# Patient Record
Sex: Male | Born: 1995 | Race: Black or African American | Hispanic: No | Marital: Single | Smoking: Former smoker
Health system: Southern US, Community
[De-identification: ages and names within clinical notes are randomized; demographics above are authoritative.]

## PROBLEM LIST (undated history)

## (undated) DIAGNOSIS — K219 Gastro-esophageal reflux disease without esophagitis: Secondary | ICD-10-CM

## (undated) DIAGNOSIS — Z008 Encounter for other general examination: Secondary | ICD-10-CM

## (undated) DIAGNOSIS — G8250 Quadriplegia, unspecified: Secondary | ICD-10-CM

## (undated) DIAGNOSIS — K592 Neurogenic bowel, not elsewhere classified: Secondary | ICD-10-CM

## (undated) DIAGNOSIS — N390 Urinary tract infection, site not specified: Secondary | ICD-10-CM

## (undated) DIAGNOSIS — W3400XA Accidental discharge from unspecified firearms or gun, initial encounter: Secondary | ICD-10-CM

## (undated) DIAGNOSIS — B9620 Unspecified Escherichia coli [E. coli] as the cause of diseases classified elsewhere: Secondary | ICD-10-CM

## (undated) DIAGNOSIS — L739 Follicular disorder, unspecified: Secondary | ICD-10-CM

## (undated) DIAGNOSIS — E669 Obesity, unspecified: Secondary | ICD-10-CM

## (undated) DIAGNOSIS — G43909 Migraine, unspecified, not intractable, without status migrainosus: Secondary | ICD-10-CM

## (undated) DIAGNOSIS — F4310 Post-traumatic stress disorder, unspecified: Secondary | ICD-10-CM

## (undated) DIAGNOSIS — N319 Neuromuscular dysfunction of bladder, unspecified: Secondary | ICD-10-CM

## (undated) DIAGNOSIS — S14109A Unspecified injury at unspecified level of cervical spinal cord, initial encounter: Secondary | ICD-10-CM

## (undated) HISTORY — DX: Urinary tract infection, site not specified: B96.20

## (undated) HISTORY — DX: Unspecified Escherichia coli (E. coli) as the cause of diseases classified elsewhere: N39.0

## (undated) HISTORY — DX: Gastro-esophageal reflux disease without esophagitis: K21.9

## (undated) HISTORY — DX: Follicular disorder, unspecified: L73.9

## (undated) HISTORY — DX: Neuromuscular dysfunction of bladder, unspecified: N31.9

## (undated) HISTORY — DX: Encounter for other general examination: Z00.8

## (undated) HISTORY — DX: Neurogenic bowel, not elsewhere classified: K59.2

## (undated) HISTORY — DX: Unspecified injury at unspecified level of cervical spinal cord, initial encounter: S14.109A

## (undated) HISTORY — DX: Post-traumatic stress disorder, unspecified: F43.10

## (undated) HISTORY — DX: Accidental discharge from unspecified firearms or gun, initial encounter: W34.00XA

## (undated) HISTORY — PX: OTHER SURGICAL HISTORY: SHX169

## (undated) HISTORY — DX: Quadriplegia, unspecified: G82.50
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
## 2016-12-09 ENCOUNTER — Emergency Department (HOSPITAL_COMMUNITY): Payer: BLUE CROSS/BLUE SHIELD

## 2016-12-09 ENCOUNTER — Emergency Department (HOSPITAL_COMMUNITY)
Admission: EM | Admit: 2016-12-09 | Discharge: 2016-12-09 | Disposition: A | Discharge status: Home / Self Care | Payer: BLUE CROSS/BLUE SHIELD | Attending: Emergency Medicine | Admitting: Emergency Medicine

## 2016-12-09 ENCOUNTER — Encounter (HOSPITAL_COMMUNITY): Payer: Self-pay | Admitting: *Deleted

## 2016-12-09 DIAGNOSIS — Y929 Unspecified place or not applicable: Secondary | ICD-10-CM | POA: Insufficient documentation

## 2016-12-09 DIAGNOSIS — X500XXA Overexertion from strenuous movement or load, initial encounter: Secondary | ICD-10-CM | POA: Diagnosis not present

## 2016-12-09 DIAGNOSIS — M5431 Sciatica, right side: Secondary | ICD-10-CM | POA: Diagnosis not present

## 2016-12-09 DIAGNOSIS — Y939 Activity, unspecified: Secondary | ICD-10-CM | POA: Diagnosis not present

## 2016-12-09 DIAGNOSIS — Z87891 Personal history of nicotine dependence: Secondary | ICD-10-CM | POA: Diagnosis not present

## 2016-12-09 DIAGNOSIS — M5441 Lumbago with sciatica, right side: Secondary | ICD-10-CM

## 2016-12-09 DIAGNOSIS — M549 Dorsalgia, unspecified: Secondary | ICD-10-CM | POA: Diagnosis present

## 2016-12-09 DIAGNOSIS — Y990 Civilian activity done for income or pay: Secondary | ICD-10-CM | POA: Insufficient documentation

## 2016-12-09 MED ORDER — METHOCARBAMOL 500 MG PO TABS: 500.0000 mg | ORAL_TABLET | Freq: Four times a day (QID) | ORAL | 0 refills | Status: DC

## 2016-12-09 NOTE — Discharge Instructions (Signed)
Please read attached information. If you experience any new or worsening signs or symptoms please return to the emergency room for evaluation. Please follow-up with your primary care provider or specialist as discussed. Please use medication prescribed only as directed and discontinue taking if you have any concerning signs or symptoms.   °

## 2016-12-09 NOTE — ED Triage Notes (Signed)
Pt reports back pain for about 2-3 weeks.  Pt reports that he works for [employer] and lifts heavy boxes and thinks that may be the cause of pain.  Pt reports his right legs feels a little weaker and at times he will feel a slight tingling sensation.  Pt a/o x 4 and ambulatory.

## 2016-12-09 NOTE — ED Provider Notes (Signed)
[facility]-EMERGENCY DEPT Provider Note   CSN: [id number] Arrival date & time: 12/09/16  1339   By signing my name below, I, [name], attest that this documentation has been prepared under the direction and in the presence of Keith FortsJeff Wyndell Cardiff, PA-C Electronically Signed: [name], ED Scribe. 12/09/16. 4:01 PM.  History   Chief Complaint Chief Complaint  Patient presents with  . Back Pain    HPI Keith [name] is a 21 y.o. male who presents to the Emergency Department complaining of lower back pain onset 3 weeks ago after lifting a heavy box. Pt reports associated tingling sensation to right posterior leg upon standing. Pt has tried advil with no relief of his symptoms. He notes that he works for [employer] and lifts heavy boxes and contributes his lower back pain to this. He notes that he has continued working since the initial incident. Pt denies numbness, weakness, color change, wound, cauda equina syndrome, fever, constipation, difficulty urinating, and any other symptoms.     The history is provided by the patient. No language interpreter was used.    Past Medical History:  Diagnosis Date  . Psychological assessment 2006   school assessment for ADHD behaviors (second grade)    Patient Active Problem List   Diagnosis Date Noted  . Closed fracture of upper end of tibia 07/28/2013  . Closed fracture of tibial tubercle 07/28/2013  . OSA (obstructive sleep apnea) 09/18/2012  . Headache(784.0) 08/25/2012  . 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 Well adolescent visit 01/31/2012  . Migraine 01/31/2012  . Rhinitis 01/31/2012  . Toenail deformity 01/31/2012  . Tendinitis of right knee 01/31/2012  . 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 ELEVATED BP READING WITHOUT DX HYPERTENSION 01/02/2009  . OBESITY 10/20/2007  . HEEL PAIN, BILATERAL 10/20/2007    Past Surgical History:  Procedure Laterality Date  . ORIF TIBIA FRACTURE Left  07/28/2013   Procedure: OPEN REDUCTION INTERNAL FIXATION (ORIF) TIBIA FRACTURE;  Surgeon: Loanne DrillingFrank V Aluisio, MD;  Location: [facility];  Service: Orthopedics;  Laterality: Left;       Home Medications    Prior to Admission medications   Medication Sig Start Date End Date Taking? Authorizing Provider  ALPRAZolam (XANAX) 0.25 MG tablet Take 1 tablet (0.25 mg total) by mouth 3 (three) times daily as needed for anxiety. 11/11/15   Loren RacerYelverton, David, MD  ibuprofen (ADVIL,MOTRIN) 800 MG tablet Take 1 tablet (800 mg total) by mouth every 8 (eight) hours as needed for mild pain or moderate pain. Patient not taking: Reported on 11/10/2015 09/18/15   Fayrene Helperran, Bowie, PA-C  methocarbamol (ROBAXIN) 500 MG tablet Take 1 tablet (500 mg total) by mouth 4 (four) times daily. 12/09/16   Annalaya Wile, Tinnie GensJeffrey, PA-C  oxyCODONE (OXY IR/ROXICODONE) 5 MG immediate release tablet Take 1-2 tablets (5-10 mg total) by mouth every 3 (three) hours as needed for moderate pain, severe pain or breakthrough pain. Patient not taking: Reported on 11/10/2015 07/29/13   Lauraine RinnePerkins, Alexzandrew L, PA-C    Family History Family History  Problem Relation Age of Onset  . ADD / ADHD Other        siblings  . GER disease Mother   . Obesity Other        Obesity runs in the family    Social History Social History  Substance Use Topics  . Smoking status: Never Smoker  . Smokeless tobacco: Not on file  . Alcohol use No  Allergies   Diflucan [fluconazole] and Saline   Review of Systems Review of Systems  Constitutional: Negative for fever.  Gastrointestinal: Negative for constipation.  Genitourinary: Negative for difficulty urinating.  Musculoskeletal: Positive for back pain (lower).  Skin: Negative for color change and wound.  Neurological: Negative for weakness and numbness.       +tingling sensation to right posterior leg  All other systems reviewed and are negative.    Physical Exam Updated Vital Signs BP 135/70 (BP Location:  Left Arm)   Pulse 69   Temp 98 F (36.7 C) (Oral)   Resp 16   SpO2 98%   Physical Exam  Constitutional: He is oriented to person, place, and time. He appears well-developed and well-nourished. No distress.  HENT:  Head: Normocephalic.  Neck: Normal range of motion. Neck supple.  Pulmonary/Chest: Effort normal.  Musculoskeletal: Normal range of motion. He exhibits tenderness. He exhibits no edema.       Cervical back: Normal.       Thoracic back: Normal.       Lumbar back: He exhibits tenderness. He exhibits no bony tenderness.  No C, T, or L spine tenderness to palpation. Very minimal tenderness to the right lateral lumbar soft tissue. No redness or swelling. SLR positive. No obvious signs of trauma, deformity, infection, step-offs. Lung expansion normal. No scoliosis or kyphosis.  Neurological: He is alert and oriented to person, place, and time.  Skin: Skin is warm and dry. He is not diaphoretic.  Psychiatric: He has a normal mood and affect. His behavior is normal. Judgment and thought content normal.  Nursing note and vitals reviewed.    ED Treatments / Results  DIAGNOSTIC STUDIES: Oxygen Saturation is 98% on RA, nl by my interpretation.    COORDINATION OF CARE: 3:56 PM Discussed treatment plan with pt at bedside and pt agreed to plan.   Labs (all labs ordered are listed, but only abnormal results are displayed) Labs Reviewed - No data to display  EKG  EKG Interpretation None       Radiology Dg Lumbar Spine Complete  Result Date: 12/09/2016 CLINICAL DATA:  Low back pain. EXAM: LUMBAR SPINE - COMPLETE 4+ VIEW COMPARISON:  CT scan abdomen dated 04/03/2007 FINDINGS: There is no evidence of lumbar spine fracture. Alignment is normal. Intervertebral disc spaces are maintained. Facet joints are normal. IMPRESSION: Negative. Electronically Signed   By: Francene Boyers M.D.   On: 12/09/2016 16:53    Procedures Procedures (including critical care time)  Medications  Ordered in ED Medications - No data to display   Initial Impression / Assessment and Plan / ED Course  I have reviewed the triage vital signs and the nursing notes.  Pertinent labs & imaging results that were available during my care of the patient were reviewed by me and considered in my medical decision making (see chart for details).     Assessment/Plan: Patient presents with sciatic back problems with No sign of neuro impingement, no red flags. Pt ambulates with minimal difficulty. Plain films show no acute fracture. Pt advised to use symptomatic therapy. Pt advised to follow up with neurosurgery. Given strict return precautions in the event new or worsening symptoms present. Pt verbalized understanding and agreement to today's plan and had no further questions or concerns at this time.   Final Clinical Impressions(s) / ED Diagnoses   Final diagnoses:  Acute right-sided low back pain with right-sided sciatica    New Prescriptions New Prescriptions   METHOCARBAMOL (ROBAXIN)  500 MG TABLET    Take 1 tablet (500 mg total) by mouth 4 (four) times daily.   I personally performed the services described in this documentation, which was scribed in my presence. The recorded information has been reviewed and is accurate.   Eyvonne MechanicHedges, Jennfier Abdulla, PA-C 12/09/16 1730    Alvira MondaySchlossman, Erin, MD 12/10/16 [phone]

## 2017-05-13 DIAGNOSIS — W3400XA Accidental discharge from unspecified firearms or gun, initial encounter: Secondary | ICD-10-CM

## 2017-05-13 DIAGNOSIS — S14109A Unspecified injury at unspecified level of cervical spinal cord, initial encounter: Secondary | ICD-10-CM

## 2017-05-13 HISTORY — DX: Unspecified injury at unspecified level of cervical spinal cord, initial encounter: S14.109A

## 2017-05-13 HISTORY — DX: Accidental discharge from unspecified firearms or gun, initial encounter: W34.00XA

## 2017-05-19 ENCOUNTER — Emergency Department (HOSPITAL_COMMUNITY)
Admission: EM | Admit: 2017-05-19 | Discharge: 2017-05-19 | Disposition: A | Discharge status: Home / Self Care | Payer: BLUE CROSS/BLUE SHIELD | Attending: Emergency Medicine | Admitting: Emergency Medicine

## 2017-05-19 ENCOUNTER — Emergency Department (HOSPITAL_COMMUNITY): Payer: BLUE CROSS/BLUE SHIELD

## 2017-05-19 ENCOUNTER — Encounter (HOSPITAL_COMMUNITY): Payer: Self-pay | Admitting: Emergency Medicine

## 2017-05-19 ENCOUNTER — Other Ambulatory Visit: Payer: Self-pay

## 2017-05-19 DIAGNOSIS — S63218A Subluxation of metacarpophalangeal joint of other finger, initial encounter: Secondary | ICD-10-CM | POA: Insufficient documentation

## 2017-05-19 DIAGNOSIS — Y999 Unspecified external cause status: Secondary | ICD-10-CM | POA: Diagnosis not present

## 2017-05-19 DIAGNOSIS — X58XXXA Exposure to other specified factors, initial encounter: Secondary | ICD-10-CM | POA: Insufficient documentation

## 2017-05-19 DIAGNOSIS — Y929 Unspecified place or not applicable: Secondary | ICD-10-CM | POA: Insufficient documentation

## 2017-05-19 DIAGNOSIS — S63219A Subluxation of metacarpophalangeal joint of unspecified finger, initial encounter: Secondary | ICD-10-CM

## 2017-05-19 DIAGNOSIS — Y939 Activity, unspecified: Secondary | ICD-10-CM | POA: Insufficient documentation

## 2017-05-19 DIAGNOSIS — S6991XA Unspecified injury of right wrist, hand and finger(s), initial encounter: Secondary | ICD-10-CM | POA: Diagnosis present

## 2017-05-19 DIAGNOSIS — S63619A Unspecified sprain of unspecified finger, initial encounter: Secondary | ICD-10-CM

## 2017-05-19 NOTE — Discharge Instructions (Addendum)
Schedule to see the Orthopaedist for evaluation  

## 2017-05-19 NOTE — Progress Notes (Signed)
Orthopedic Tech Progress Note Patient Details:  Renaldo HarrisonCameron R Staples 12/08/1995 [id number]  Ortho Devices Type of Ortho Device: Short arm splint Ortho Device/Splint Interventions: Application   Post Interventions Patient Tolerated: Well Instructions Provided: Care of device   Saul FordyceJennifer C Chanell Nadeau 05/19/2017, 5:26 PM

## 2017-05-19 NOTE — ED Triage Notes (Signed)
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 Today pain continued 9/10 achy sharp, Radial pulse +2 full sensation.
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
## 2017-05-23 ENCOUNTER — Other Ambulatory Visit: Payer: Self-pay

## 2017-05-23 ENCOUNTER — Emergency Department (HOSPITAL_COMMUNITY): Payer: BLUE CROSS/BLUE SHIELD

## 2017-05-23 ENCOUNTER — Inpatient Hospital Stay (HOSPITAL_COMMUNITY)
Admission: EM | Admit: 2017-05-23 | Discharge: 2017-06-12 | DRG: 028 | Disposition: A | Discharge status: Rehabilitation Facility | Payer: BLUE CROSS/BLUE SHIELD | Attending: General Surgery | Admitting: General Surgery

## 2017-05-23 ENCOUNTER — Encounter (HOSPITAL_COMMUNITY): Payer: Self-pay | Admitting: Emergency Medicine

## 2017-05-23 DIAGNOSIS — S12590A Other displaced fracture of sixth cervical vertebra, initial encounter for closed fracture: Secondary | ICD-10-CM | POA: Diagnosis present

## 2017-05-23 DIAGNOSIS — R6510 Systemic inflammatory response syndrome (SIRS) of non-infectious origin without acute organ dysfunction: Secondary | ICD-10-CM | POA: Diagnosis not present

## 2017-05-23 DIAGNOSIS — D62 Acute posthemorrhagic anemia: Secondary | ICD-10-CM

## 2017-05-23 DIAGNOSIS — S1190XA Unspecified open wound of unspecified part of neck, initial encounter: Secondary | ICD-10-CM | POA: Diagnosis not present

## 2017-05-23 DIAGNOSIS — M792 Neuralgia and neuritis, unspecified: Secondary | ICD-10-CM | POA: Diagnosis not present

## 2017-05-23 DIAGNOSIS — S1190XD Unspecified open wound of unspecified part of neck, subsequent encounter: Secondary | ICD-10-CM | POA: Diagnosis not present

## 2017-05-23 DIAGNOSIS — S42102A Fracture of unspecified part of scapula, left shoulder, initial encounter for closed fracture: Secondary | ICD-10-CM | POA: Diagnosis present

## 2017-05-23 DIAGNOSIS — M4312 Spondylolisthesis, cervical region: Secondary | ICD-10-CM | POA: Diagnosis present

## 2017-05-23 DIAGNOSIS — T1490XA Injury, unspecified, initial encounter: Secondary | ICD-10-CM | POA: Diagnosis not present

## 2017-05-23 DIAGNOSIS — N319 Neuromuscular dysfunction of bladder, unspecified: Secondary | ICD-10-CM | POA: Diagnosis not present

## 2017-05-23 DIAGNOSIS — F1210 Cannabis abuse, uncomplicated: Secondary | ICD-10-CM | POA: Diagnosis present

## 2017-05-23 DIAGNOSIS — S12690A Other displaced fracture of seventh cervical vertebra, initial encounter for closed fracture: Secondary | ICD-10-CM | POA: Diagnosis present

## 2017-05-23 DIAGNOSIS — S63101A Unspecified subluxation of right thumb, initial encounter: Secondary | ICD-10-CM | POA: Diagnosis present

## 2017-05-23 DIAGNOSIS — E669 Obesity, unspecified: Secondary | ICD-10-CM | POA: Diagnosis present

## 2017-05-23 DIAGNOSIS — G8220 Paraplegia, unspecified: Secondary | ICD-10-CM | POA: Diagnosis not present

## 2017-05-23 DIAGNOSIS — E871 Hypo-osmolality and hyponatremia: Secondary | ICD-10-CM | POA: Diagnosis not present

## 2017-05-23 DIAGNOSIS — G8254 Quadriplegia, C5-C7 incomplete: Secondary | ICD-10-CM | POA: Diagnosis present

## 2017-05-23 DIAGNOSIS — Z6839 Body mass index (BMI) 39.0-39.9, adult: Secondary | ICD-10-CM

## 2017-05-23 DIAGNOSIS — R631 Polydipsia: Secondary | ICD-10-CM | POA: Diagnosis present

## 2017-05-23 DIAGNOSIS — R509 Fever, unspecified: Secondary | ICD-10-CM | POA: Diagnosis not present

## 2017-05-23 DIAGNOSIS — L739 Follicular disorder, unspecified: Secondary | ICD-10-CM | POA: Diagnosis not present

## 2017-05-23 DIAGNOSIS — N390 Urinary tract infection, site not specified: Secondary | ICD-10-CM | POA: Diagnosis not present

## 2017-05-23 DIAGNOSIS — F17290 Nicotine dependence, other tobacco product, uncomplicated: Secondary | ICD-10-CM | POA: Diagnosis present

## 2017-05-23 DIAGNOSIS — W3400XA Accidental discharge from unspecified firearms or gun, initial encounter: Secondary | ICD-10-CM | POA: Diagnosis not present

## 2017-05-23 DIAGNOSIS — S14106A Unspecified injury at C6 level of cervical spinal cord, initial encounter: Secondary | ICD-10-CM | POA: Diagnosis present

## 2017-05-23 DIAGNOSIS — S14109D Unspecified injury at unspecified level of cervical spinal cord, subsequent encounter: Secondary | ICD-10-CM | POA: Diagnosis not present

## 2017-05-23 DIAGNOSIS — L738 Other specified follicular disorders: Secondary | ICD-10-CM | POA: Diagnosis not present

## 2017-05-23 DIAGNOSIS — F17210 Nicotine dependence, cigarettes, uncomplicated: Secondary | ICD-10-CM | POA: Diagnosis present

## 2017-05-23 DIAGNOSIS — S14109A Unspecified injury at unspecified level of cervical spinal cord, initial encounter: Secondary | ICD-10-CM

## 2017-05-23 DIAGNOSIS — W3400XD Accidental discharge from unspecified firearms or gun, subsequent encounter: Secondary | ICD-10-CM | POA: Diagnosis not present

## 2017-05-23 DIAGNOSIS — I82612 Acute embolism and thrombosis of superficial veins of left upper extremity: Secondary | ICD-10-CM | POA: Diagnosis present

## 2017-05-23 DIAGNOSIS — G8918 Other acute postprocedural pain: Secondary | ICD-10-CM | POA: Diagnosis not present

## 2017-05-23 DIAGNOSIS — S14107A Unspecified injury at C7 level of cervical spinal cord, initial encounter: Principal | ICD-10-CM | POA: Diagnosis present

## 2017-05-23 DIAGNOSIS — S1193XA Puncture wound without foreign body of unspecified part of neck, initial encounter: Secondary | ICD-10-CM

## 2017-05-23 DIAGNOSIS — E875 Hyperkalemia: Secondary | ICD-10-CM

## 2017-05-23 DIAGNOSIS — F4310 Post-traumatic stress disorder, unspecified: Secondary | ICD-10-CM | POA: Diagnosis not present

## 2017-05-23 DIAGNOSIS — S14109S Unspecified injury at unspecified level of cervical spinal cord, sequela: Secondary | ICD-10-CM | POA: Diagnosis not present

## 2017-05-23 DIAGNOSIS — S14155D Other incomplete lesion at C5 level of cervical spinal cord, subsequent encounter: Secondary | ICD-10-CM | POA: Diagnosis not present

## 2017-05-23 DIAGNOSIS — M542 Cervicalgia: Secondary | ICD-10-CM

## 2017-05-23 DIAGNOSIS — R58 Hemorrhage, not elsewhere classified: Secondary | ICD-10-CM | POA: Diagnosis present

## 2017-05-23 DIAGNOSIS — G8250 Quadriplegia, unspecified: Secondary | ICD-10-CM | POA: Diagnosis not present

## 2017-05-23 DIAGNOSIS — Z720 Tobacco use: Secondary | ICD-10-CM | POA: Diagnosis not present

## 2017-05-23 DIAGNOSIS — K592 Neurogenic bowel, not elsewhere classified: Secondary | ICD-10-CM | POA: Diagnosis not present

## 2017-05-23 DIAGNOSIS — Z419 Encounter for procedure for purposes other than remedying health state, unspecified: Secondary | ICD-10-CM

## 2017-05-23 DIAGNOSIS — S14156D Other incomplete lesion at C6 level of cervical spinal cord, subsequent encounter: Secondary | ICD-10-CM | POA: Diagnosis not present

## 2017-05-23 DIAGNOSIS — A419 Sepsis, unspecified organism: Secondary | ICD-10-CM

## 2017-05-23 DIAGNOSIS — Z452 Encounter for adjustment and management of vascular access device: Secondary | ICD-10-CM

## 2017-05-23 HISTORY — DX: Migraine, unspecified, not intractable, without status migrainosus: G43.909

## 2017-05-23 HISTORY — DX: Obesity, unspecified: E66.9

## 2017-05-23 LAB — PREPARE FRESH FROZEN PLASMA
UNIT DIVISION: 0
Unit division: 0

## 2017-05-23 LAB — URINALYSIS, ROUTINE W REFLEX MICROSCOPIC
BILIRUBIN URINE: NEGATIVE
GLUCOSE, UA: NEGATIVE mg/dL
HGB URINE DIPSTICK: NEGATIVE
Ketones, ur: NEGATIVE mg/dL
Leukocytes, UA: NEGATIVE
Nitrite: NEGATIVE
PH: 6 (ref 5.0–8.0)
Protein, ur: NEGATIVE mg/dL

## 2017-05-23 LAB — TYPE AND SCREEN
ABO/RH(D): O POS
ANTIBODY SCREEN: NEGATIVE
UNIT DIVISION: 0
Unit division: 0

## 2017-05-23 LAB — BPAM FFP
BLOOD PRODUCT EXPIRATION DATE: 201901272359
Blood Product Expiration Date: 201901252359
ISSUE DATE / TIME: 201901112028
ISSUE DATE / TIME: 201901112028
UNIT TYPE AND RH: 6200
Unit Type and Rh: 6200

## 2017-05-23 LAB — BPAM RBC
BLOOD PRODUCT EXPIRATION DATE: 201902062359
Blood Product Expiration Date: 201902042359
ISSUE DATE / TIME: 201901112028
ISSUE DATE / TIME: 201901112028
UNIT TYPE AND RH: 9500
UNIT TYPE AND RH: 9500

## 2017-05-23 LAB — ABO/RH: ABO/RH(D): O POS

## 2017-05-23 MED ORDER — SODIUM CHLORIDE 0.9 % IV SOLN
INTRAVENOUS | Status: DC
  Administered 2017-05-24 – 2017-05-25 (×4): via INTRAVENOUS

## 2017-05-23 MED ORDER — ONDANSETRON 4 MG PO TBDP: 4.0000 mg | ORAL_TABLET | Freq: Four times a day (QID) | ORAL | Status: DC | PRN

## 2017-05-23 MED ORDER — PANTOPRAZOLE SODIUM 40 MG PO TBEC
40.0000 mg | DELAYED_RELEASE_TABLET | Freq: Every day | ORAL | Status: DC
  Administered 2017-05-26 – 2017-06-12 (×17): 40 mg via ORAL
  Filled 2017-05-23 (×17): qty 1

## 2017-05-23 MED ORDER — HYDROMORPHONE HCL 1 MG/ML IJ SOLN
1.0000 mg | INTRAMUSCULAR | Status: DC | PRN
  Administered 2017-05-23 – 2017-05-29 (×29): 1 mg via INTRAVENOUS
  Filled 2017-05-23 (×30): qty 1

## 2017-05-23 MED ORDER — SODIUM CHLORIDE 0.9 % IV SOLN
INTRAVENOUS | Status: AC | PRN
  Administered 2017-05-23: 125 mL/h via INTRAVENOUS

## 2017-05-23 MED ORDER — METHOCARBAMOL 1000 MG/10ML IJ SOLN
500.0000 mg | Freq: Four times a day (QID) | INTRAVENOUS | Status: DC | PRN
  Filled 2017-05-23 (×2): qty 5

## 2017-05-23 MED ORDER — GABAPENTIN 300 MG PO CAPS
300.0000 mg | ORAL_CAPSULE | Freq: Three times a day (TID) | ORAL | Status: DC
  Administered 2017-05-24 – 2017-05-28 (×15): 300 mg via ORAL
  Filled 2017-05-23 (×15): qty 1

## 2017-05-23 MED ORDER — ONDANSETRON HCL 4 MG/2ML IJ SOLN: 4.0000 mg | Freq: Four times a day (QID) | INTRAMUSCULAR | Status: DC | PRN

## 2017-05-23 MED ORDER — DOCUSATE SODIUM 100 MG PO CAPS
100.0000 mg | ORAL_CAPSULE | Freq: Two times a day (BID) | ORAL | Status: DC
  Administered 2017-05-24 – 2017-05-28 (×10): 100 mg via ORAL
  Filled 2017-05-23 (×9): qty 1

## 2017-05-23 MED ORDER — OXYCODONE HCL 5 MG PO TABS
5.0000 mg | ORAL_TABLET | ORAL | Status: DC | PRN
  Administered 2017-05-24 – 2017-06-02 (×14): 5 mg via ORAL
  Filled 2017-05-23 (×18): qty 1

## 2017-05-23 MED ORDER — PANTOPRAZOLE SODIUM 40 MG IV SOLR
40.0000 mg | Freq: Every day | INTRAVENOUS | Status: DC
  Administered 2017-05-24 – 2017-05-25 (×2): 40 mg via INTRAVENOUS
  Filled 2017-05-23 (×2): qty 40

## 2017-05-23 MED ORDER — ACETAMINOPHEN 325 MG PO TABS
650.0000 mg | ORAL_TABLET | Freq: Four times a day (QID) | ORAL | Status: DC
  Administered 2017-05-24 – 2017-06-12 (×66): 650 mg via ORAL
  Filled 2017-05-23 (×68): qty 2

## 2017-05-23 MED ORDER — BISACODYL 10 MG RE SUPP
10.0000 mg | Freq: Every day | RECTAL | Status: DC | PRN
  Administered 2017-05-27: 10 mg via RECTAL
  Filled 2017-05-23: qty 1

## 2017-05-23 NOTE — ED Notes (Signed)
Pt transferred to CT.
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
## 2017-05-23 NOTE — ED Notes (Signed)
Foley placed, pt semi erect, pt did not feel foley insertion.
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
## 2017-05-24 LAB — BASIC METABOLIC PANEL
ANION GAP: 6 (ref 5–15)
BUN: 15 mg/dL (ref 6–20)
CALCIUM: 8.5 mg/dL — AB (ref 8.9–10.3)
CO2: 19 mmol/L — ABNORMAL LOW (ref 22–32)
Chloride: 108 mmol/L (ref 101–111)
Creatinine, Ser: 1.02 mg/dL (ref 0.61–1.24)
Glucose, Bld: 118 mg/dL — ABNORMAL HIGH (ref 65–99)
Potassium: 5.2 mmol/L — ABNORMAL HIGH (ref 3.5–5.1)
SODIUM: 133 mmol/L — AB (ref 135–145)

## 2017-05-24 LAB — CBC
HCT: 35.5 % — ABNORMAL LOW (ref 39.0–52.0)
Hemoglobin: 11.7 g/dL — ABNORMAL LOW (ref 13.0–17.0)
MCH: 28.4 pg (ref 26.0–34.0)
MCHC: 33 g/dL (ref 30.0–36.0)
MCV: 86.2 fL (ref 78.0–100.0)
PLATELETS: 255 10*3/uL (ref 150–400)
RBC: 4.12 MIL/uL — ABNORMAL LOW (ref 4.22–5.81)
RDW: 13 % (ref 11.5–15.5)
WBC: 15.5 10*3/uL — ABNORMAL HIGH (ref 4.0–10.5)

## 2017-05-24 LAB — BLOOD PRODUCT ORDER (VERBAL) VERIFICATION

## 2017-05-24 LAB — MRSA PCR SCREENING: MRSA BY PCR: NEGATIVE

## 2017-05-24 MED ORDER — DOUBLE ANTIBIOTIC 500-10000 UNIT/GM EX OINT
TOPICAL_OINTMENT | Freq: Two times a day (BID) | CUTANEOUS | Status: DC
  Administered 2017-05-25: 1 via TOPICAL
  Administered 2017-05-25: 02:00:00 via TOPICAL
  Administered 2017-05-25: 1 via TOPICAL
  Administered 2017-05-26 – 2017-06-12 (×31): via TOPICAL
  Filled 2017-05-24 (×4): qty 28.4

## 2017-05-24 MED ORDER — WHITE PETROLATUM EX OINT
TOPICAL_OINTMENT | CUTANEOUS | Status: AC
  Administered 2017-05-24: 1
  Filled 2017-05-24: qty 28.35

## 2017-05-24 MED ORDER — CHLORHEXIDINE GLUCONATE 0.12 % MT SOLN
15.0000 mL | Freq: Two times a day (BID) | OROMUCOSAL | Status: DC
  Administered 2017-05-24 – 2017-05-25 (×3): 15 mL via OROMUCOSAL
  Filled 2017-05-24 (×2): qty 15

## 2017-05-24 MED ORDER — ORAL CARE MOUTH RINSE
15.0000 mL | Freq: Two times a day (BID) | OROMUCOSAL | Status: DC
  Administered 2017-05-24: 15 mL via OROMUCOSAL

## 2017-05-24 NOTE — Progress Notes (Signed)
PT Cancellation Note  Patient Details Name: Keith [name] MRN: [id number] DOB: 03/06/1996   Cancelled Treatment:    Reason Eval/Treat Not Completed: Patient not medically ready   Keith [name] 05/24/2017, 10:40 AM
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
## 2017-05-24 NOTE — ED Notes (Signed)
c-collar changed to aspen collar
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
## 2017-05-25 LAB — URINE CULTURE
Culture: NO GROWTH
Special Requests: NORMAL
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
## 2017-05-26 ENCOUNTER — Other Ambulatory Visit: Payer: Self-pay | Admitting: Neurosurgery

## 2017-05-26 ENCOUNTER — Inpatient Hospital Stay (HOSPITAL_COMMUNITY): Payer: BLUE CROSS/BLUE SHIELD

## 2017-05-26 DIAGNOSIS — S1190XD Unspecified open wound of unspecified part of neck, subsequent encounter: Secondary | ICD-10-CM

## 2017-05-26 DIAGNOSIS — T1490XA Injury, unspecified, initial encounter: Secondary | ICD-10-CM

## 2017-05-26 DIAGNOSIS — E875 Hyperkalemia: Secondary | ICD-10-CM

## 2017-05-26 DIAGNOSIS — D62 Acute posthemorrhagic anemia: Secondary | ICD-10-CM

## 2017-05-26 DIAGNOSIS — R509 Fever, unspecified: Secondary | ICD-10-CM

## 2017-05-26 DIAGNOSIS — A419 Sepsis, unspecified organism: Secondary | ICD-10-CM

## 2017-05-26 DIAGNOSIS — S14109A Unspecified injury at unspecified level of cervical spinal cord, initial encounter: Secondary | ICD-10-CM

## 2017-05-26 DIAGNOSIS — E871 Hypo-osmolality and hyponatremia: Secondary | ICD-10-CM

## 2017-05-26 DIAGNOSIS — M542 Cervicalgia: Secondary | ICD-10-CM

## 2017-05-26 DIAGNOSIS — Z720 Tobacco use: Secondary | ICD-10-CM

## 2017-05-26 DIAGNOSIS — W3400XD Accidental discharge from unspecified firearms or gun, subsequent encounter: Secondary | ICD-10-CM

## 2017-05-26 DIAGNOSIS — R6510 Systemic inflammatory response syndrome (SIRS) of non-infectious origin without acute organ dysfunction: Secondary | ICD-10-CM

## 2017-05-26 DIAGNOSIS — S14109D Unspecified injury at unspecified level of cervical spinal cord, subsequent encounter: Secondary | ICD-10-CM

## 2017-05-26 DIAGNOSIS — F1210 Cannabis abuse, uncomplicated: Secondary | ICD-10-CM

## 2017-05-26 MED ORDER — DIPHENHYDRAMINE HCL 25 MG PO CAPS
25.0000 mg | ORAL_CAPSULE | Freq: Four times a day (QID) | ORAL | Status: DC | PRN
  Administered 2017-05-26 – 2017-05-28 (×2): 25 mg via ORAL
  Filled 2017-05-26 (×2): qty 1

## 2017-05-26 MED ORDER — NAPHAZOLINE-PHENIRAMINE 0.025-0.3 % OP SOLN
1.0000 [drp] | Freq: Four times a day (QID) | OPHTHALMIC | Status: DC | PRN
  Administered 2017-05-29: 1 [drp] via OPHTHALMIC
  Filled 2017-05-26: qty 5

## 2017-05-26 MED ORDER — IBUPROFEN 200 MG PO TABS
400.0000 mg | ORAL_TABLET | Freq: Four times a day (QID) | ORAL | Status: DC | PRN
Start: 2017-05-26 — End: 2017-06-03
  Administered 2017-05-26 – 2017-05-31 (×9): 400 mg via ORAL
  Filled 2017-05-26 (×10): qty 2

## 2017-05-26 MED ORDER — CYCLOBENZAPRINE HCL 10 MG PO TABS
10.0000 mg | ORAL_TABLET | Freq: Three times a day (TID) | ORAL | Status: DC | PRN
  Administered 2017-05-26 – 2017-05-28 (×3): 10 mg via ORAL
  Filled 2017-05-26 (×5): qty 1

## 2017-05-26 MED ORDER — POLYETHYLENE GLYCOL 3350 17 G PO PACK
17.0000 g | PACK | Freq: Every day | ORAL | Status: DC
  Administered 2017-05-26 – 2017-05-28 (×3): 17 g via ORAL
  Filled 2017-05-26 (×3): qty 1
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
## 2017-05-26 NOTE — Progress Notes (Signed)
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 Keith [name] has a desire to live, and is well supported by family. At the time of the visit on Monday 14 January Keith [name] was uncomfortable, in pain and being attended to by the Nurse. Chaplain looks forward to returning. Present in the room were Pt.'s father and grandmother. Pastorally looking forward to hearing pt/s reflection on self & life considering recent events. Will assess how patient is situating himself in respect to hope.
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
## 2017-05-26 NOTE — Progress Notes (Signed)
Note RN CM has made referral to [facility] in [place]. I will follow at a distance at this time. [phone]

## 2017-05-27 ENCOUNTER — Inpatient Hospital Stay (HOSPITAL_COMMUNITY): Payer: BLUE CROSS/BLUE SHIELD

## 2017-05-27 DIAGNOSIS — R509 Fever, unspecified: Secondary | ICD-10-CM

## 2017-05-27 DIAGNOSIS — S1190XA Unspecified open wound of unspecified part of neck, initial encounter: Secondary | ICD-10-CM

## 2017-05-27 DIAGNOSIS — W3400XA Accidental discharge from unspecified firearms or gun, initial encounter: Secondary | ICD-10-CM

## 2017-05-27 DIAGNOSIS — T1490XA Injury, unspecified, initial encounter: Secondary | ICD-10-CM

## 2017-05-27 LAB — BASIC METABOLIC PANEL
Anion gap: 8 (ref 5–15)
BUN: 10 mg/dL (ref 6–20)
CALCIUM: 8.5 mg/dL — AB (ref 8.9–10.3)
CO2: 23 mmol/L (ref 22–32)
Chloride: 103 mmol/L (ref 101–111)
Creatinine, Ser: 0.97 mg/dL (ref 0.61–1.24)
GFR calc Af Amer: >60 mL/min (ref >60)
GFR calc non Af Amer: >60 mL/min (ref >60)
GLUCOSE: 94 mg/dL (ref 65–99)
Potassium: 4 mmol/L (ref 3.5–5.1)
Sodium: 134 mmol/L — ABNORMAL LOW (ref 135–145)

## 2017-05-27 LAB — URINE CULTURE: Culture: NO GROWTH

## 2017-05-27 LAB — CBC
HCT: 31.7 % — ABNORMAL LOW (ref 39.0–52.0)
Hemoglobin: 10.7 g/dL — ABNORMAL LOW (ref 13.0–17.0)
MCH: 28.5 pg (ref 26.0–34.0)
MCHC: 33.8 g/dL (ref 30.0–36.0)
MCV: 84.3 fL (ref 78.0–100.0)
Platelets: 198 10*3/uL (ref 150–400)
RBC: 3.76 MIL/uL — ABNORMAL LOW (ref 4.22–5.81)
RDW: 12.3 % (ref 11.5–15.5)
WBC: 7.8 10*3/uL (ref 4.0–10.5)

## 2017-05-27 LAB — HIV ANTIBODY (ROUTINE TESTING W REFLEX): HIV Screen 4th Generation wRfx: NONREACTIVE

## 2017-05-27 MED ORDER — PSEUDOEPHEDRINE HCL 30 MG PO TABS
30.0000 mg | ORAL_TABLET | ORAL | Status: DC | PRN
  Administered 2017-05-28 – 2017-06-11 (×6): 30 mg via ORAL
  Filled 2017-05-27 (×10): qty 1

## 2017-05-27 NOTE — Progress Notes (Signed)
[facility] Admissions Coordinator, [employer], plans to visit pt and family later today to discuss possible admission.  Notified pt's mother, and she states father will be present to meet with Admissions Coordinator. Ms. Marthenia RollingMaus plans to be here between 12:30 and 1:00.    Quintella BatonJulie W. Shakirra Buehler, RN, BSN  Trauma/Neuro ICU Case Manager [phone]
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
## 2017-05-27 NOTE — Progress Notes (Signed)
Bilateral upper extremity venous duplex has been completed. Negative for DVT. There is evidence of acute superficial vein thrombosis involving the cephalic vein of the left upper extremity.  Bilateral lower extremity venous duplex has been completed. Negative for DVT.  05/27/17 4:04 PM Olen CordialGreg Kylle Lall RVT

## 2017-05-27 NOTE — Progress Notes (Signed)
SLP Cancellation Note  Patient Details Name: Keith [name] MRN: [id number] DOB: 05/19/1995   Cancelled treatment:       Reason Eval/Treat Not Completed: Patient/family processing meeting with rep from [facility], pt appropriately emotional.  Will f/u next date with trainer for respiratory muscle exercises for voice/cough - pt/parents voice agreement.    [name], [name] [name] 05/27/2017, 4:41 PM

## 2017-05-27 NOTE — H&P (Signed)
See consult note dated 05/23/17

## 2017-05-27 NOTE — Progress Notes (Signed)
OT Cancellation Note  Patient Details Name: Keith [name] MRN: [id number] DOB: 05/10/1996   Cancelled Treatment:    Reason Eval/Treat Not Completed: Pain limiting ability to participate;Patient at procedure or test/ unavailable.  Pt just finished bathing and 10/10 pain on first attempt, and [name].  Will try back as able.    Alfons Sulkowski Madroneonarpe, OTR/L [phone]   [name], [name] 05/27/2017, 1:48 PM
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
## 2017-05-27 NOTE — Progress Notes (Signed)
PT Cancellation Note  Patient Details Name: Keith [name] MRN: [id number] DOB: 10/30/1995   Cancelled Treatment:    Reason Eval/Treat Not Completed: Other (comment)(attempted to see pt x 2. 1st time after bath and too painful. 2nd attempt [facility])   [name] 05/27/2017, 1:41 PM  [name] [name], PT [phone]

## 2017-05-28 ENCOUNTER — Inpatient Hospital Stay (HOSPITAL_COMMUNITY): Payer: BLUE CROSS/BLUE SHIELD

## 2017-05-28 MED ORDER — DEXTROSE 5 % IV SOLN
3.0000 g | INTRAVENOUS | Status: AC
  Administered 2017-05-29: 3 g via INTRAVENOUS
  Filled 2017-05-28: qty 3
  Filled 2017-05-28: qty 3000

## 2017-05-28 MED ORDER — LORAZEPAM 1 MG PO TABS
1.0000 mg | ORAL_TABLET | Freq: Every evening | ORAL | Status: DC | PRN
Start: 2017-05-28 — End: 2017-06-12
  Administered 2017-05-31 – 2017-06-11 (×6): 1 mg via ORAL
  Filled 2017-05-28 (×6): qty 1

## 2017-05-28 MED ORDER — ENSURE ENLIVE PO LIQD
237.0000 mL | Freq: Three times a day (TID) | ORAL | Status: DC
  Administered 2017-05-28 – 2017-06-11 (×23): 237 mL via ORAL

## 2017-05-28 MED ORDER — POLYETHYLENE GLYCOL 3350 17 G PO PACK
17.0000 g | PACK | Freq: Two times a day (BID) | ORAL | Status: DC
  Administered 2017-05-28 – 2017-06-12 (×12): 17 g via ORAL
  Filled 2017-05-28 (×15): qty 1

## 2017-05-28 NOTE — Progress Notes (Signed)
PT fell asleep and woke up in a panic. Parents of pt called nurse into room. Nurse assessed pt and found that his respiration had shot up into the 30's and blood pressure had gone up to 144/79. Nurse called MD and he said at this time there was nothing he could give the pt. Will cont. To monitor.

## 2017-05-28 NOTE — Progress Notes (Signed)
Pt had a oral temp of 101.52F. Called MD on call and Hermenia BersRaimerz returned call. Ordered blood and urine cultures and a Chest X-ray. Nurse gave patient Advil and rechecked temp. And it came down to 101.2. Will cont to monitor.
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
## 2017-05-28 NOTE — Progress Notes (Signed)
SLP Cancellation Note  Patient Details Name: Keith [name] MRN: [id number] DOB: 11/06/1995   Cancelled treatment:       Reason Eval/Treat Not Completed: Other (comment)(Pt working with PT upon therapist's arrival, will follow up for RMST as pt is available)   Jackalyn LombardPage, Grabiel Schmutz L 05/28/2017, 11:59 AM

## 2017-05-28 NOTE — Progress Notes (Signed)
Updated clinical and therapy notes [facility] via fax.    Quintella BatonJulie W. Nashla Althoff, RN, BSN  Trauma/Neuro ICU Case Manager [phone]

## 2017-05-28 NOTE — Plan of Care (Signed)
Patient remains febrile, alternating tylenol and ibuprofen.  Patient able to have a large bowel movement today.  Family remains at bedside, supportative.
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
## 2017-05-29 ENCOUNTER — Inpatient Hospital Stay (HOSPITAL_COMMUNITY): Payer: BLUE CROSS/BLUE SHIELD

## 2017-05-29 ENCOUNTER — Encounter (HOSPITAL_COMMUNITY): Payer: Self-pay | Admitting: Certified Registered Nurse Anesthetist

## 2017-05-29 ENCOUNTER — Inpatient Hospital Stay (HOSPITAL_COMMUNITY): Payer: BLUE CROSS/BLUE SHIELD | Admitting: Certified Registered Nurse Anesthetist

## 2017-05-29 ENCOUNTER — Inpatient Hospital Stay (HOSPITAL_COMMUNITY): Admission: EM | Disposition: A | Discharge status: Rehabilitation Facility | Payer: Self-pay | Source: Home / Self Care

## 2017-05-29 HISTORY — PX: POSTERIOR CERVICAL FUSION/FORAMINOTOMY: SHX5038

## 2017-05-29 LAB — CBC
HCT: 31.6 % — ABNORMAL LOW (ref 39.0–52.0)
Hemoglobin: 10.4 g/dL — ABNORMAL LOW (ref 13.0–17.0)
MCH: 28.3 pg (ref 26.0–34.0)
MCHC: 32.9 g/dL (ref 30.0–36.0)
MCV: 85.9 fL (ref 78.0–100.0)
PLATELETS: 222 10*3/uL (ref 150–400)
RBC: 3.68 MIL/uL — ABNORMAL LOW (ref 4.22–5.81)
RDW: 12.8 % (ref 11.5–15.5)
WBC: 9.3 10*3/uL (ref 4.0–10.5)

## 2017-05-29 LAB — URINE CULTURE: CULTURE: NO GROWTH

## 2017-05-29 LAB — BASIC METABOLIC PANEL
Anion gap: 9 (ref 5–15)
BUN: 12 mg/dL (ref 6–20)
CHLORIDE: 102 mmol/L (ref 101–111)
CO2: 22 mmol/L (ref 22–32)
CREATININE: 1 mg/dL (ref 0.61–1.24)
Calcium: 8.6 mg/dL — ABNORMAL LOW (ref 8.9–10.3)
GFR calc non Af Amer: >60 mL/min (ref >60)
GLUCOSE: 98 mg/dL (ref 65–99)
Potassium: 5 mmol/L (ref 3.5–5.1)
Sodium: 133 mmol/L — ABNORMAL LOW (ref 135–145)

## 2017-05-29 LAB — TYPE AND SCREEN
ABO/RH(D): O POS
Antibody Screen: NEGATIVE

## 2017-05-29 LAB — ABO/RH: ABO/RH(D): O POS

## 2017-05-29 SURGERY — POSTERIOR CERVICAL FUSION/FORAMINOTOMY LEVEL 5
Anesthesia: General

## 2017-05-29 MED ORDER — PHENOL 1.4 % MT LIQD: 1.0000 | OROMUCOSAL | Status: DC | PRN

## 2017-05-29 MED ORDER — PROPOFOL 10 MG/ML IV BOLUS
INTRAVENOUS | Status: DC | PRN
  Administered 2017-05-29: 170 mg via INTRAVENOUS
  Administered 2017-05-29: 30 mg via INTRAVENOUS

## 2017-05-29 MED ORDER — ONDANSETRON HCL 4 MG/2ML IJ SOLN
INTRAMUSCULAR | Status: AC
  Filled 2017-05-29: qty 2

## 2017-05-29 MED ORDER — SODIUM CHLORIDE 0.9 % IV SOLN: 250.0000 mL | INTRAVENOUS | Status: DC

## 2017-05-29 MED ORDER — LACTATED RINGERS IV SOLN
INTRAVENOUS | Status: DC | PRN
  Administered 2017-05-29: 17:00:00 via INTRAVENOUS

## 2017-05-29 MED ORDER — CHLORHEXIDINE GLUCONATE CLOTH 2 % EX PADS
6.0000 | MEDICATED_PAD | Freq: Once | CUTANEOUS | Status: AC
  Administered 2017-05-29: 6 via TOPICAL

## 2017-05-29 MED ORDER — ACETAMINOPHEN 650 MG RE SUPP: 650.0000 mg | RECTAL | Status: DC | PRN

## 2017-05-29 MED ORDER — HYDROMORPHONE HCL 1 MG/ML IJ SOLN
1.0000 mg | INTRAMUSCULAR | Status: DC | PRN
  Administered 2017-05-29 – 2017-06-03 (×32): 1 mg via INTRAVENOUS
  Filled 2017-05-29 (×34): qty 1

## 2017-05-29 MED ORDER — GABAPENTIN 300 MG PO CAPS
300.0000 mg | ORAL_CAPSULE | Freq: Three times a day (TID) | ORAL | Status: DC
  Administered 2017-05-29 – 2017-06-12 (×39): 300 mg via ORAL
  Filled 2017-05-29 (×39): qty 1

## 2017-05-29 MED ORDER — LACTATED RINGERS IV SOLN
INTRAVENOUS | Status: DC
  Administered 2017-05-29 (×2): via INTRAVENOUS

## 2017-05-29 MED ORDER — PHENYLEPHRINE HCL 10 MG/ML IJ SOLN
INTRAMUSCULAR | Status: DC | PRN
  Administered 2017-05-29 (×3): 120 ug via INTRAVENOUS

## 2017-05-29 MED ORDER — ZOLPIDEM TARTRATE 5 MG PO TABS
5.0000 mg | ORAL_TABLET | Freq: Every evening | ORAL | Status: DC | PRN
  Administered 2017-05-29 – 2017-06-01 (×2): 5 mg via ORAL
  Filled 2017-05-29 (×2): qty 1

## 2017-05-29 MED ORDER — THROMBIN 5000 UNITS EX SOLR
CUTANEOUS | Status: AC
  Filled 2017-05-29: qty 5000

## 2017-05-29 MED ORDER — METHOCARBAMOL 1000 MG/10ML IJ SOLN
500.0000 mg | Freq: Four times a day (QID) | INTRAVENOUS | Status: DC | PRN
  Filled 2017-05-29: qty 5

## 2017-05-29 MED ORDER — PROMETHAZINE HCL 25 MG/ML IJ SOLN: 6.2500 mg | INTRAMUSCULAR | Status: DC | PRN

## 2017-05-29 MED ORDER — SODIUM CHLORIDE 0.9% FLUSH: 3.0000 mL | INTRAVENOUS | Status: DC | PRN

## 2017-05-29 MED ORDER — FENTANYL CITRATE (PF) 100 MCG/2ML IJ SOLN
INTRAMUSCULAR | Status: AC
  Administered 2017-05-29: 50 ug via INTRAVENOUS
  Filled 2017-05-29: qty 2

## 2017-05-29 MED ORDER — BISACODYL 10 MG RE SUPP: 10.0000 mg | Freq: Every day | RECTAL | Status: DC | PRN

## 2017-05-29 MED ORDER — VECURONIUM BROMIDE 10 MG IV SOLR
INTRAVENOUS | Status: DC | PRN
Start: 2017-05-29 — End: 2017-05-29
  Administered 2017-05-29: 5 mg via INTRAVENOUS
  Administered 2017-05-29: 2 mg via INTRAVENOUS
  Administered 2017-05-29: 3 mg via INTRAVENOUS

## 2017-05-29 MED ORDER — ACETAMINOPHEN 325 MG PO TABS
650.0000 mg | ORAL_TABLET | ORAL | Status: DC | PRN
  Administered 2017-05-31: 650 mg via ORAL
  Filled 2017-05-29: qty 2

## 2017-05-29 MED ORDER — THROMBIN (RECOMBINANT) 20000 UNITS EX SOLR
CUTANEOUS | Status: AC
  Filled 2017-05-29: qty 20000

## 2017-05-29 MED ORDER — FENTANYL CITRATE (PF) 100 MCG/2ML IJ SOLN
50.0000 ug | Freq: Once | INTRAMUSCULAR | Status: AC
  Administered 2017-05-29: 50 ug via INTRAVENOUS

## 2017-05-29 MED ORDER — THROMBIN (RECOMBINANT) 5000 UNITS EX SOLR
OROMUCOSAL | Status: DC | PRN
  Administered 2017-05-29: 18:00:00 via TOPICAL

## 2017-05-29 MED ORDER — PROPOFOL 10 MG/ML IV BOLUS
INTRAVENOUS | Status: AC
  Filled 2017-05-29: qty 20

## 2017-05-29 MED ORDER — ONDANSETRON HCL 4 MG/2ML IJ SOLN
INTRAMUSCULAR | Status: DC | PRN
  Administered 2017-05-29: 4 mg via INTRAVENOUS

## 2017-05-29 MED ORDER — FENTANYL CITRATE (PF) 100 MCG/2ML IJ SOLN
INTRAMUSCULAR | Status: DC | PRN
  Administered 2017-05-29 (×3): 50 ug via INTRAVENOUS
  Administered 2017-05-29 (×2): 100 ug via INTRAVENOUS

## 2017-05-29 MED ORDER — HYDROMORPHONE HCL 1 MG/ML IJ SOLN
0.2500 mg | INTRAMUSCULAR | Status: DC | PRN
  Administered 2017-05-29 (×2): 0.5 mg via INTRAVENOUS

## 2017-05-29 MED ORDER — BACITRACIN ZINC 500 UNIT/GM EX OINT
TOPICAL_OINTMENT | CUTANEOUS | Status: DC | PRN
  Administered 2017-05-29: 1 via TOPICAL

## 2017-05-29 MED ORDER — SENNA 8.6 MG PO TABS
1.0000 | ORAL_TABLET | Freq: Two times a day (BID) | ORAL | Status: DC
  Administered 2017-05-29 – 2017-06-12 (×8): 8.6 mg via ORAL
  Filled 2017-05-29 (×17): qty 1

## 2017-05-29 MED ORDER — HYDROMORPHONE HCL 1 MG/ML IJ SOLN
INTRAMUSCULAR | Status: AC
  Filled 2017-05-29: qty 1

## 2017-05-29 MED ORDER — LIDOCAINE-EPINEPHRINE 1 %-1:100000 IJ SOLN
INTRAMUSCULAR | Status: AC
  Filled 2017-05-29: qty 1

## 2017-05-29 MED ORDER — LIDOCAINE-EPINEPHRINE 1 %-1:100000 IJ SOLN
INTRAMUSCULAR | Status: DC | PRN
  Administered 2017-05-29: 5 mL

## 2017-05-29 MED ORDER — BUPIVACAINE HCL (PF) 0.5 % IJ SOLN
INTRAMUSCULAR | Status: DC | PRN
  Administered 2017-05-29: 5 mL

## 2017-05-29 MED ORDER — SODIUM CHLORIDE 0.9% FLUSH
3.0000 mL | Freq: Two times a day (BID) | INTRAVENOUS | Status: DC
  Administered 2017-05-29 – 2017-05-30 (×2): 3 mL via INTRAVENOUS

## 2017-05-29 MED ORDER — BUPIVACAINE HCL (PF) 0.5 % IJ SOLN
INTRAMUSCULAR | Status: AC
  Filled 2017-05-29: qty 30

## 2017-05-29 MED ORDER — DEXTROSE 5 % IV SOLN
INTRAVENOUS | Status: DC | PRN
  Administered 2017-05-29: 40 ug/min via INTRAVENOUS

## 2017-05-29 MED ORDER — PHENYLEPHRINE 40 MCG/ML (10ML) SYRINGE FOR IV PUSH (FOR BLOOD PRESSURE SUPPORT)
PREFILLED_SYRINGE | INTRAVENOUS | Status: AC
  Filled 2017-05-29: qty 10

## 2017-05-29 MED ORDER — BACITRACIN ZINC 500 UNIT/GM EX OINT
TOPICAL_OINTMENT | CUTANEOUS | Status: AC
  Filled 2017-05-29: qty 28.35

## 2017-05-29 MED ORDER — SODIUM CHLORIDE 0.9 % IR SOLN
Status: DC | PRN
  Administered 2017-05-29: 18:00:00

## 2017-05-29 MED ORDER — HYDROCODONE-ACETAMINOPHEN 5-325 MG PO TABS: 1.0000 | ORAL_TABLET | ORAL | Status: DC | PRN

## 2017-05-29 MED ORDER — SENNOSIDES-DOCUSATE SODIUM 8.6-50 MG PO TABS: 1.0000 | ORAL_TABLET | Freq: Every evening | ORAL | Status: DC | PRN

## 2017-05-29 MED ORDER — LIDOCAINE 2% (20 MG/ML) 5 ML SYRINGE
INTRAMUSCULAR | Status: AC
  Filled 2017-05-29: qty 5

## 2017-05-29 MED ORDER — ONDANSETRON HCL 4 MG PO TABS: 4.0000 mg | ORAL_TABLET | Freq: Four times a day (QID) | ORAL | Status: DC | PRN

## 2017-05-29 MED ORDER — OXYCODONE HCL 5 MG PO TABS: 10.0000 mg | ORAL_TABLET | ORAL | Status: DC | PRN

## 2017-05-29 MED ORDER — ROCURONIUM BROMIDE 100 MG/10ML IV SOLN
INTRAVENOUS | Status: DC | PRN
  Administered 2017-05-29: 60 mg via INTRAVENOUS
  Administered 2017-05-29: 100 mg via INTRAVENOUS
  Administered 2017-05-29: 40 mg via INTRAVENOUS

## 2017-05-29 MED ORDER — ALBUMIN HUMAN 5 % IV SOLN
INTRAVENOUS | Status: DC | PRN
  Administered 2017-05-29: 18:00:00 via INTRAVENOUS

## 2017-05-29 MED ORDER — ACETAMINOPHEN 500 MG PO TABS
1000.0000 mg | ORAL_TABLET | Freq: Four times a day (QID) | ORAL | Status: AC
  Administered 2017-05-29 – 2017-05-30 (×3): 1000 mg via ORAL
  Filled 2017-05-29 (×4): qty 2

## 2017-05-29 MED ORDER — SODIUM CHLORIDE 0.9 % IV SOLN
INTRAVENOUS | Status: DC
  Administered 2017-05-29: via INTRAVENOUS

## 2017-05-29 MED ORDER — OXYCODONE HCL 5 MG PO TABS: 5.0000 mg | ORAL_TABLET | Freq: Once | ORAL | Status: DC | PRN

## 2017-05-29 MED ORDER — MIDAZOLAM HCL 2 MG/2ML IJ SOLN
INTRAMUSCULAR | Status: AC
  Filled 2017-05-29: qty 2

## 2017-05-29 MED ORDER — LIDOCAINE HCL (CARDIAC) 20 MG/ML IV SOLN
INTRAVENOUS | Status: DC | PRN
  Administered 2017-05-29: 80 mg via INTRAVENOUS

## 2017-05-29 MED ORDER — THROMBIN (RECOMBINANT) 5000 UNITS EX SOLR
CUTANEOUS | Status: AC
  Filled 2017-05-29: qty 5000

## 2017-05-29 MED ORDER — MUPIROCIN 2 % EX OINT
TOPICAL_OINTMENT | Freq: Two times a day (BID) | CUTANEOUS | Status: DC
  Administered 2017-05-29 – 2017-06-03 (×12): via NASAL
  Filled 2017-05-29 (×3): qty 22

## 2017-05-29 MED ORDER — FENTANYL CITRATE (PF) 250 MCG/5ML IJ SOLN
INTRAMUSCULAR | Status: AC
  Filled 2017-05-29: qty 5

## 2017-05-29 MED ORDER — DEXAMETHASONE SODIUM PHOSPHATE 10 MG/ML IJ SOLN
INTRAMUSCULAR | Status: DC | PRN
  Administered 2017-05-29: 10 mg via INTRAVENOUS

## 2017-05-29 MED ORDER — DEXAMETHASONE SODIUM PHOSPHATE 10 MG/ML IJ SOLN
INTRAMUSCULAR | Status: AC
  Filled 2017-05-29: qty 1

## 2017-05-29 MED ORDER — MENTHOL 3 MG MT LOZG
1.0000 | LOZENGE | OROMUCOSAL | Status: DC | PRN
  Filled 2017-05-29: qty 9

## 2017-05-29 MED ORDER — FLEET ENEMA 7-19 GM/118ML RE ENEM: 1.0000 | ENEMA | Freq: Once | RECTAL | Status: DC | PRN

## 2017-05-29 MED ORDER — CEFAZOLIN SODIUM-DEXTROSE 2-4 GM/100ML-% IV SOLN
2.0000 g | Freq: Three times a day (TID) | INTRAVENOUS | Status: AC
  Administered 2017-05-29 – 2017-05-30 (×2): 2 g via INTRAVENOUS
  Filled 2017-05-29 (×2): qty 100

## 2017-05-29 MED ORDER — DOCUSATE SODIUM 100 MG PO CAPS
100.0000 mg | ORAL_CAPSULE | Freq: Two times a day (BID) | ORAL | Status: DC
  Administered 2017-05-29 – 2017-06-12 (×10): 100 mg via ORAL
  Filled 2017-05-29 (×16): qty 1

## 2017-05-29 MED ORDER — METHOCARBAMOL 500 MG PO TABS
500.0000 mg | ORAL_TABLET | Freq: Four times a day (QID) | ORAL | Status: DC | PRN
  Administered 2017-05-31: 500 mg via ORAL
  Filled 2017-05-29 (×3): qty 1

## 2017-05-29 MED ORDER — ROCURONIUM BROMIDE 10 MG/ML (PF) SYRINGE
PREFILLED_SYRINGE | INTRAVENOUS | Status: AC
  Filled 2017-05-29: qty 15

## 2017-05-29 MED ORDER — SUGAMMADEX SODIUM 200 MG/2ML IV SOLN
INTRAVENOUS | Status: DC | PRN
  Administered 2017-05-29: 200 mg via INTRAVENOUS

## 2017-05-29 MED ORDER — OXYCODONE HCL 5 MG/5ML PO SOLN: 5.0000 mg | Freq: Once | ORAL | Status: DC | PRN

## 2017-05-29 MED ORDER — ONDANSETRON HCL 4 MG/2ML IJ SOLN
4.0000 mg | Freq: Four times a day (QID) | INTRAMUSCULAR | Status: DC | PRN
Start: 2017-05-29 — End: 2017-06-12
  Filled 2017-05-29: qty 2

## 2017-05-29 SURGICAL SUPPLY — 61 items
BAG DECANTER FOR FLEXI CONT (MISCELLANEOUS) ×2 IMPLANT
BENZOIN TINCTURE PRP APPL 2/3 (GAUZE/BANDAGES/DRESSINGS) ×4 IMPLANT
BIT DRILL 2.4 (BIT) ×2 IMPLANT
BLADE CLIPPER SURG (BLADE) ×2 IMPLANT
BLADE ULTRA TIP 2M (BLADE) IMPLANT
BUR MATCHSTICK NEURO 3.0 LAGG (BURR) ×2 IMPLANT
BUR PRECISION FLUTE 5.0 (BURR) ×2 IMPLANT
CANISTER SUCT 3000ML PPV (MISCELLANEOUS) ×2 IMPLANT
CARTRIDGE OIL MAESTRO DRILL (MISCELLANEOUS) ×1 IMPLANT
DECANTER SPIKE VIAL GLASS SM (MISCELLANEOUS) ×2 IMPLANT
DIFFUSER DRILL AIR PNEUMATIC (MISCELLANEOUS) ×2 IMPLANT
DRAPE C-ARM 42X72 X-RAY (DRAPES) ×4 IMPLANT
DRAPE LAPAROTOMY 100X72 PEDS (DRAPES) ×2 IMPLANT
DRAPE POUCH INSTRU U-SHP 10X18 (DRAPES) ×2 IMPLANT
DRSG OPSITE POSTOP 4X10 (GAUZE/BANDAGES/DRESSINGS) ×2 IMPLANT
DURAPREP 6ML APPLICATOR 50/CS (WOUND CARE) ×2 IMPLANT
ELECT REM PT RETURN 9FT ADLT (ELECTROSURGICAL) ×2
ELECTRODE REM PT RTRN 9FT ADLT (ELECTROSURGICAL) ×1 IMPLANT
GAUZE SPONGE 4X4 12PLY STRL (GAUZE/BANDAGES/DRESSINGS) IMPLANT
GAUZE SPONGE 4X4 16PLY XRAY LF (GAUZE/BANDAGES/DRESSINGS) IMPLANT
GLOVE BIO SURGEON STRL SZ7 (GLOVE) IMPLANT
GLOVE BIOGEL PI IND STRL 7.0 (GLOVE) ×1 IMPLANT
GLOVE BIOGEL PI IND STRL 7.5 (GLOVE) ×1 IMPLANT
GLOVE BIOGEL PI INDICATOR 7.0 (GLOVE) ×1
GLOVE BIOGEL PI INDICATOR 7.5 (GLOVE) ×1
GLOVE ECLIPSE 7.0 STRL STRAW (GLOVE) ×2 IMPLANT
GOWN STRL REUS W/ TWL LRG LVL3 (GOWN DISPOSABLE) ×2 IMPLANT
GOWN STRL REUS W/ TWL XL LVL3 (GOWN DISPOSABLE) ×2 IMPLANT
GOWN STRL REUS W/TWL 2XL LVL3 (GOWN DISPOSABLE) IMPLANT
GOWN STRL REUS W/TWL LRG LVL3 (GOWN DISPOSABLE) ×2
GOWN STRL REUS W/TWL XL LVL3 (GOWN DISPOSABLE) ×2
GRAFT BN 10X1XDBM MAGNIFUSE (Bone Implant) ×1 IMPLANT
GRAFT BN 5X1XSPNE CVD POST DBM (Bone Implant) ×1 IMPLANT
GRAFT BONE MAGNIFUSE 1X10CM (Bone Implant) ×1 IMPLANT
GRAFT BONE MAGNIFUSE 1X5CM (Bone Implant) ×1 IMPLANT
KIT BASIN OR (CUSTOM PROCEDURE TRAY) ×2 IMPLANT
KIT ROOM TURNOVER OR (KITS) ×2 IMPLANT
NEEDLE HYPO 25X1 1.5 SAFETY (NEEDLE) ×2 IMPLANT
NS IRRIG 1000ML POUR BTL (IV SOLUTION) ×2 IMPLANT
OIL CARTRIDGE MAESTRO DRILL (MISCELLANEOUS) ×2
PACK LAMINECTOMY NEURO (CUSTOM PROCEDURE TRAY) ×2 IMPLANT
PAD ARMBOARD 7.5X6 YLW CONV (MISCELLANEOUS) ×12 IMPLANT
PIN MAYFIELD SKULL DISP (PIN) ×2 IMPLANT
ROD COBALT CHROME 3.5X300 (Rod) ×2 IMPLANT
SCREW MULTI AXIAL 3.5X14MM (Screw) ×12 IMPLANT
SCREW MULTI AXIAL 5.5X30MM (Screw) ×4 IMPLANT
SCREW SET 3600315 STANDARD (Screw) ×24 IMPLANT
SCREW SET 3600315 STD (Screw) ×12 IMPLANT
SCREW TRANSITION 5.5X35 STER (Screw) ×8 IMPLANT
SPONGE LAP 4X18 X RAY DECT (DISPOSABLE) IMPLANT
SPONGE SURGIFOAM ABS GEL 100 (HEMOSTASIS) IMPLANT
STAPLER SKIN PROX WIDE 3.9 (STAPLE) ×2 IMPLANT
STRIP CLOSURE SKIN 1/2X4 (GAUZE/BANDAGES/DRESSINGS) ×2 IMPLANT
SUT ETHILON 3 0 FSL (SUTURE) IMPLANT
SUT VIC AB 0 CT1 18XCR BRD8 (SUTURE) ×5 IMPLANT
SUT VIC AB 0 CT1 8-18 (SUTURE) ×5
SUT VIC AB 2-0 CT1 18 (SUTURE) ×6 IMPLANT
TOWEL GREEN STERILE (TOWEL DISPOSABLE) ×2 IMPLANT
TOWEL GREEN STERILE FF (TOWEL DISPOSABLE) ×2 IMPLANT
UNDERPAD 30X30 (UNDERPADS AND DIAPERS) ×2 IMPLANT
WATER STERILE IRR 1000ML POUR (IV SOLUTION) IMPLANT
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
## 2017-05-29 NOTE — Anesthesia Postprocedure Evaluation (Signed)
Anesthesia Post Note  Patient: [name]  Procedure(s) Performed: POSTERIOR CERVICAL FOUR- CERVICAL FIVE, CERVICAL FIVE- CERVICAL SIX, CERVICAL SIX- CERVICAL SEVEN, CERVICAL SEVEN- THORACIC ONE, THORACIC ONE- THORACIC TWO, THORACIC TWO-THORACIC THREE SEGMENTAL FUSION (N/A )     Patient location during evaluation: PACU Anesthesia Type: General Level of consciousness: awake and alert Pain management: pain level controlled Vital Signs Assessment: post-procedure vital signs reviewed and stable Respiratory status: spontaneous breathing, nonlabored ventilation, respiratory function stable and patient connected to nasal cannula oxygen Cardiovascular status: blood pressure returned to baseline and stable Postop Assessment: no apparent nausea or vomiting Anesthetic complications: no    Last Vitals:  Vitals:   05/29/17 2100 05/29/17 2115  BP: (!) 133/53 (!) 110/53  Pulse: 97 97  Resp: 10 17  Temp:  37.5 C  SpO2: 97% 94%                Beryle Lathehomas E Brock
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
## 2017-05-29 NOTE — Brief Op Note (Signed)
05/29/2017  8:11 PM  PATIENT:  Keith [name]  21 y.o. male  PRE-OPERATIVE DIAGNOSIS:  CERVICAL FRACTURE  POST-OPERATIVE DIAGNOSIS:  CERVICAL FRACTURE  PROCEDURE:  Procedure(s) with comments: POSTERIOR CERVICAL FOUR- CERVICAL FIVE, CERVICAL FIVE- CERVICAL SIX, CERVICAL SIX- CERVICAL SEVEN, CERVICAL SEVEN- THORACIC ONE, THORACIC ONE- THORACIC TWO, THORACIC TWO-THORACIC THREE SEGMENTAL FUSION (N/A) - POSTERIOR CERVICAL 4- CERVICAL 5, CERVICAL 5- CERVIAL 6, CERVICAL 6- CERVICAL 7, CERVICAL 7- THORACIC 1, THORACIC 1- THORACIC 2, THORACIC 2- THORACI 3 SEGMENTAL FUSION  SURGEON:  Surgeon(s) and Role:    * Lisbeth RenshawNundkumar, Horald Birky, MD - Primary  PHYSICIAN ASSISTANT:   ASSISTANTS: Hanley SeamenVincent Costeall, PA-C   ANESTHESIA:   general  EBL:  600 mL   BLOOD ADMINISTERED:none  DRAINS: none   LOCAL MEDICATIONS USED:  BUPIVICAINE   SPECIMEN:  No Specimen  DISPOSITION OF SPECIMEN:  N/A  COUNTS:  YES  TOURNIQUET:  * No tourniquets in log *  DICTATION: .Note written in EPIC  PLAN OF CARE: Transfer to ICU  PATIENT DISPOSITION:  ICU - extubated and stable.   Delay start of Pharmacological VTE agent (>24hrs) due to surgical blood loss or risk of bleeding: yes

## 2017-05-29 NOTE — Transfer of Care (Signed)
Immediate Anesthesia Transfer of Care Note  Patient: Keith [name]  Procedure(s) Performed: POSTERIOR CERVICAL FOUR- CERVICAL FIVE, CERVICAL FIVE- CERVICAL SIX, CERVICAL SIX- CERVICAL SEVEN, CERVICAL SEVEN- THORACIC ONE, THORACIC ONE- THORACIC TWO, THORACIC TWO-THORACIC THREE SEGMENTAL FUSION (N/A )  Patient Location: PACU  Anesthesia Type:General  Level of Consciousness: awake, oriented, drowsy and responds to stimulation  Airway & Oxygen Therapy: Patient Spontanous Breathing and Patient connected to face mask oxygen  Post-op Assessment: Report given to RN and Post -op Vital signs reviewed and stable  Post vital signs: Reviewed and stable  Last Vitals:  Vitals:   05/29/17 1100 05/29/17 1200  BP:  (!) 138/54  Pulse:  (!) 53  Resp:  14  Temp:  37.5 C  SpO2: 97% 97%    Last Pain:  Vitals:   05/29/17 1200  TempSrc: Oral  PainSc:       Patients Stated Pain Goal: 2 (05/28/17 1833)  Complications: No apparent anesthesia complications
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
## 2017-05-29 NOTE — Anesthesia Preprocedure Evaluation (Addendum)
Anesthesia Evaluation  Patient identified by MRN, date of birth, ID band Patient awake    Reviewed: Allergy & Precautions, NPO status , Patient's Chart, lab work & pertinent test results  Airway Mallampati: II       Dental  (+) Teeth Intact, Dental Advisory Given   Pulmonary Current Smoker,    breath sounds clear to auscultation       Cardiovascular  Rhythm:Regular Rate:Normal     Neuro/Psych    GI/Hepatic   Endo/Other    Renal/GU      Musculoskeletal   Abdominal (+) + obese,   Peds  Hematology   Anesthesia Other Findings Patient unable to move LEs Biceps function in arms  Reproductive/Obstetrics                             Anesthesia Physical Anesthesia Plan  ASA: III  Anesthesia Plan:    Post-op Pain Management:    Induction:   PONV Risk Score and Plan:   Airway Management Planned:   Additional Equipment:   Intra-op Plan:   Post-operative Plan:   Informed Consent:   Plan Discussed with:   Anesthesia Plan Comments:         Anesthesia Quick Evaluation

## 2017-05-29 NOTE — Anesthesia Procedure Notes (Signed)
Central Venous Catheter Insertion Performed by: Kipp BroodJoslin, Deneise Getty, MD, anesthesiologist Start/End1/17/2019 5:50 PM, 05/29/2017 6:00 PM Preanesthetic checklist: patient identified, IV checked, site marked, risks and benefits discussed, surgical consent, monitors and equipment checked, pre-op evaluation and timeout performed Hand hygiene performed , maximum sterile barriers used  and Seldinger technique used Catheter size: 8 Fr Double lumen Procedure performed without using ultrasound guided technique. Following insertion, line sutured, dressing applied and Biopatch. Post procedure assessment: blood return through all ports  Post procedure complications: arrhythmia. Patient tolerated the procedure well with no immediate complications.

## 2017-05-29 NOTE — Anesthesia Procedure Notes (Signed)
Arterial Line Insertion Start/End1/17/2019 4:40 PM, 05/29/2017 4:45 PM Performed by: Jeani HawkingBerry, Germain Koopmann J, CRNA, CRNA  Patient location: OR. Preanesthetic checklist: patient identified, site marked, monitors and equipment checked and pre-op evaluation Left, radial was placed Catheter size: 20 G Maximum sterile barriers used   Attempts: 1 Procedure performed without using ultrasound guided technique. Following insertion, dressing applied and Biopatch. Post procedure assessment: normal  Patient tolerated the procedure well with no immediate complications.

## 2017-05-30 LAB — BASIC METABOLIC PANEL
ANION GAP: 10 (ref 5–15)
BUN: 22 mg/dL — AB (ref 6–20)
CHLORIDE: 96 mmol/L — AB (ref 101–111)
CO2: 22 mmol/L (ref 22–32)
Calcium: 8.5 mg/dL — ABNORMAL LOW (ref 8.9–10.3)
Creatinine, Ser: 1.1 mg/dL (ref 0.61–1.24)
GFR calc Af Amer: >60 mL/min (ref >60)
GLUCOSE: 117 mg/dL — AB (ref 65–99)
POTASSIUM: 5.9 mmol/L — AB (ref 3.5–5.1)
Sodium: 128 mmol/L — ABNORMAL LOW (ref 135–145)

## 2017-05-30 LAB — CBC
HEMATOCRIT: 30.7 % — AB (ref 39.0–52.0)
HEMOGLOBIN: 10.3 g/dL — AB (ref 13.0–17.0)
MCH: 28.6 pg (ref 26.0–34.0)
MCHC: 33.6 g/dL (ref 30.0–36.0)
MCV: 85.3 fL (ref 78.0–100.0)
Platelets: 228 10*3/uL (ref 150–400)
RBC: 3.6 MIL/uL — ABNORMAL LOW (ref 4.22–5.81)
RDW: 13.2 % (ref 11.5–15.5)
WBC: 18.2 10*3/uL — ABNORMAL HIGH (ref 4.0–10.5)

## 2017-05-30 LAB — PROTIME-INR
INR: 1.11
Prothrombin Time: 14.2 s (ref 11.4–15.2)

## 2017-05-30 LAB — APTT: aPTT: 32 seconds (ref 24–36)

## 2017-05-30 MED ORDER — SODIUM CHLORIDE 0.9 % IV BOLUS (SEPSIS)
1000.0000 mL | Freq: Once | INTRAVENOUS | Status: AC
  Administered 2017-05-30: 1000 mL via INTRAVENOUS

## 2017-05-30 MED FILL — Thrombin For Soln 5000 Unit: CUTANEOUS | Qty: 5000 | Status: AC

## 2017-05-30 NOTE — Progress Notes (Signed)
Orthopedic Tech Progress Note Patient Details:  Keith [name] 01/06/1996 [id number]  Ortho Devices Type of Ortho Device: Shoulder immobilizer, Wrist splint, Ace wrap Ortho Device/Splint Interventions: Application   Post Interventions Patient Tolerated: Well Instructions Provided: Care of device, Adjustment of device   Saul FordyceJennifer C [name] 05/30/2017, 1:07 PM

## 2017-05-30 NOTE — Progress Notes (Signed)
Patient core temp is 39.5 and HR is running 99-105. Tylenol and Ibuprofen has been given, see EMAR. Patient is blankets stripped off of bed and patient is wiped down with cool wash cloth. Cool wash clothes are applied to head and under arms. Dr Sheliah HatchKinsinger notified. Continuing to monitor.
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
## 2017-05-30 NOTE — Progress Notes (Signed)
Pt requested pain meds around 1115 this morning.  Dilaudid 1mg  pulled from pyxis in form of two 0.5mg  syringes.  One syring dropped on floor of pt's room after opening, dose wasted in pyxis.  Pt received the full 0.5mg  amount from the second syringe.  In order to administer intended dose, I pulled another 1mg  from pyxis and had to return the unused 0.5mg  back to pyxis.  Count correct, no discrepancy flags noted.
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
## 2017-05-31 LAB — CULTURE, BLOOD (ROUTINE X 2)
CULTURE: NO GROWTH
CULTURE: NO GROWTH
Special Requests: ADEQUATE
Special Requests: ADEQUATE

## 2017-05-31 LAB — CBC WITH DIFFERENTIAL/PLATELET
BASOS PCT: 0 %
Basophils Absolute: 0 10*3/uL (ref 0.0–0.1)
EOS PCT: 1 %
Eosinophils Absolute: 0.1 10*3/uL (ref 0.0–0.7)
HCT: 27.7 % — ABNORMAL LOW (ref 39.0–52.0)
Hemoglobin: 9.3 g/dL — ABNORMAL LOW (ref 13.0–17.0)
LYMPHS ABS: 1.6 10*3/uL (ref 0.7–4.0)
Lymphocytes Relative: 9 %
MCH: 28.6 pg (ref 26.0–34.0)
MCHC: 33.6 g/dL (ref 30.0–36.0)
MCV: 85.2 fL (ref 78.0–100.0)
MONO ABS: 2.9 10*3/uL — AB (ref 0.1–1.0)
MONOS PCT: 17 %
Neutro Abs: 12.5 10*3/uL — ABNORMAL HIGH (ref 1.7–7.7)
Neutrophils Relative %: 73 %
PLATELETS: 218 10*3/uL (ref 150–400)
RBC: 3.25 MIL/uL — ABNORMAL LOW (ref 4.22–5.81)
RDW: 12.8 % (ref 11.5–15.5)
WBC: 17.1 10*3/uL — ABNORMAL HIGH (ref 4.0–10.5)

## 2017-05-31 LAB — BASIC METABOLIC PANEL
Anion gap: 7 (ref 5–15)
BUN: 17 mg/dL (ref 6–20)
CALCIUM: 8.2 mg/dL — AB (ref 8.9–10.3)
CHLORIDE: 98 mmol/L — AB (ref 101–111)
CO2: 23 mmol/L (ref 22–32)
CREATININE: 0.93 mg/dL (ref 0.61–1.24)
GFR calc Af Amer: >60 mL/min (ref >60)
Glucose, Bld: 102 mg/dL — ABNORMAL HIGH (ref 65–99)
Potassium: 5 mmol/L (ref 3.5–5.1)
SODIUM: 128 mmol/L — AB (ref 135–145)

## 2017-05-31 LAB — NASOPHARYNGEAL CULTURE
CULTURE: NORMAL
SPECIAL REQUESTS: NORMAL

## 2017-05-31 MED ORDER — ORAL CARE MOUTH RINSE
15.0000 mL | Freq: Two times a day (BID) | OROMUCOSAL | Status: DC
  Administered 2017-06-01 – 2017-06-12 (×16): 15 mL via OROMUCOSAL

## 2017-05-31 MED ORDER — SODIUM CHLORIDE 0.9% FLUSH
3.0000 mL | INTRAVENOUS | Status: DC | PRN
  Administered 2017-06-01: 3 mL via INTRAVENOUS
  Filled 2017-05-31: qty 3

## 2017-05-31 MED ORDER — SODIUM CHLORIDE 1 G PO TABS
1.0000 g | ORAL_TABLET | Freq: Two times a day (BID) | ORAL | Status: DC
  Administered 2017-05-31 – 2017-06-01 (×3): 1 g via ORAL
  Filled 2017-05-31 (×4): qty 1

## 2017-05-31 MED ORDER — SODIUM CHLORIDE 0.9% FLUSH
3.0000 mL | Freq: Two times a day (BID) | INTRAVENOUS | Status: DC
  Administered 2017-05-31 – 2017-06-12 (×16): 3 mL via INTRAVENOUS
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
## 2017-05-31 NOTE — Progress Notes (Signed)
Patient ID: Keith [name], male   DOB: 10/06/1995, 22 y.o.   MRN: [id number] Vital signs are stable Patient is awake alert Moves proximal shoulders with shrug has some rotational movement of the right arm and forearm trace movement in his fingertips No visible movement in lower extremities though he does note some occasional twitching which may be spasm is condition remains unchanged continues to recover from his recent surgery.

## 2017-05-31 NOTE — Progress Notes (Signed)
1900: Received handoff report from RN. Pt resting comfortably with visitors. Denies pain. Refuses turn. Reports drowsiness r/t pain medication.  2000: Pt calling out for medicine. Upon assessment, pt is subjectively "tight all over." Pt states this is similar to being sore after a workout. We discuss expected muscle wasting r/t immobility; pt confirms understanding and states that this is different. Pt is not tight to palpation; no observable spasm, but he does report pain with palpation. We discussed medications for spasm. Pt is familiar with robaxin but refuses as it makes him hot. Administered ibuprofen to start and hopefully manage temperature. Will continue to monitor.  2100: Pt reports relief from ibuprofen.  2200: Pt c/o nausea and "feeling funny" when taking bedtime meds. It seems that pt may be taking these meds on an empty stomach. I suggested that pt tries to eat something before bedtime meds to see if it helps.   2300: Pt c/o being hot. Core temp 100.10F. Removed covers. Applied ice packs, fan, cold compress. Mild relief. Pt feels this is r/t bedtime meds.   0400: Pt c/o pain after being cleaned up. Otherwise, temp regulation is okay and pt resting comfortably  0700: Handoff report given to RN. No acute events overnight.
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
## 2017-06-01 LAB — CBC WITH DIFFERENTIAL/PLATELET
Basophils Absolute: 0 10*3/uL (ref 0.0–0.1)
Basophils Relative: 0 %
Eosinophils Absolute: 0.1 10*3/uL (ref 0.0–0.7)
Eosinophils Relative: 1 %
HEMATOCRIT: 26.5 % — AB (ref 39.0–52.0)
HEMOGLOBIN: 9.3 g/dL — AB (ref 13.0–17.0)
LYMPHS ABS: 1.3 10*3/uL (ref 0.7–4.0)
LYMPHS PCT: 9 %
MCH: 29.6 pg (ref 26.0–34.0)
MCHC: 35.1 g/dL (ref 30.0–36.0)
MCV: 84.4 fL (ref 78.0–100.0)
MONOS PCT: 19 %
Monocytes Absolute: 2.6 10*3/uL (ref 0.1–1.0)
NEUTROS ABS: 9.7 10*3/uL (ref 1.7–7.7)
NEUTROS PCT: 71 %
Platelets: 232 10*3/uL (ref 150–400)
RBC: 3.14 MIL/uL — ABNORMAL LOW (ref 4.22–5.81)
RDW: 12.6 % (ref 11.5–15.5)
WBC: 13.7 10*3/uL — ABNORMAL HIGH (ref 4.0–10.5)

## 2017-06-01 LAB — BASIC METABOLIC PANEL
Anion gap: 10 (ref 5–15)
BUN: 16 mg/dL (ref 6–20)
CHLORIDE: 93 mmol/L — AB (ref 101–111)
CO2: 23 mmol/L (ref 22–32)
Calcium: 8.3 mg/dL — ABNORMAL LOW (ref 8.9–10.3)
Creatinine, Ser: 0.84 mg/dL (ref 0.61–1.24)
GFR calc Af Amer: >60 mL/min (ref >60)
GFR calc non Af Amer: >60 mL/min (ref >60)
Glucose, Bld: 99 mg/dL (ref 65–99)
POTASSIUM: 4.4 mmol/L (ref 3.5–5.1)
Sodium: 126 mmol/L — ABNORMAL LOW (ref 135–145)

## 2017-06-01 NOTE — Progress Notes (Signed)
1900: Handoff report received from RN. Pt resting comfortably with visitors. Discussed plan of care for the shift. This includes trying CPAP for sleep as pt has hx of OSA charted. Pt amenable to plan.  2000: Paged on call trauma provider White, MD for CPAP order. Verbal order received.   2200: RT in room. Pt educated on CPAP. CPAP nasal mask applied. Will monitor throughout the night.  2300: Pt asking to remove CPAP. Pt appears anxious and reports frustration with the flow being asynchronous and drying.  0100: Pt reports SOB; wants to try CPAP again. Shortly after RT replaces CPAP, pt calling out to have it removed. Pt visibly anxious; reports pain.   0500: Pt c/o dry mouth r/t medications making him feel "chalky." Pt states that meds get stuck in his throat and cause him to cough. Family has biotene available.  0700: Handoff report given to RN. No acute events overnight.

## 2017-06-01 NOTE — Progress Notes (Signed)
Patient ID: Keith [name], male   DOB: 08/11/1995, 22 y.o.   MRN: [id number]   I forgot to mention that the patient's hyponatremia has worsened.  There is no reason to think that he has cerebral salt wasting so we will try fluid restrictions and repeat his sodium tomorrow.
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
## 2017-06-01 NOTE — Progress Notes (Signed)
Trauma Service Note  Subjective: Patient doing much better.  Fever curves appears to be improving.  Objective: Vital signs in last 24 hours: Temp:  [98 F (36.7 C)-101.8 F (38.8 C)] 98 F (36.7 C) (01/20 0835) Pulse Rate:  [71-86] 71 (01/20 0400) Resp:  [14-22] 18 (01/20 0400) BP: (105-128)/(49-50) 112/50 (01/19 2310) SpO2:  [95 %-100 %] 95 % (01/20 0400) Last BM Date: 06/01/17  Intake/Output from previous day: 01/19 0701 - 01/20 0700 In: 2790 [P.O.:2460; I.V.:330] Out: 3200 [Urine:3200] Intake/Output this shift: No intake/output data recorded.  General: No acute distress  Lungs: Clear.  Right subclavian line to be removed.  Abd: Benign  Extremities: No changes  Neuro: No change  Lab Results: CBC  Recent Labs    05/31/17 0439 06/01/17 0501  WBC 17.1* 13.7*  HGB 9.3* 9.3*  HCT 27.7* 26.5*  PLT 218 232   BMET Recent Labs    05/31/17 0439 06/01/17 0501  NA 128* 126*  K 5.0 4.4  CL 98* 93*  CO2 23 23  GLUCOSE 102* 99  BUN 17 16  CREATININE 0.93 0.84  CALCIUM 8.2* 8.3*   PT/INR Recent Labs    05/30/17 0500  LABPROT 14.2  INR 1.11   ABG No results for input(s): PHART, HCO3 in the last 72 hours.  Invalid input(s): PCO2, PO2  Studies/Results: No results found.  Anti-infectives: Anti-infectives (From admission, onward)   Start     Dose/Rate Route Frequency Ordered Stop   05/29/17 2330  ceFAZolin (ANCEF) IVPB 2g/100 mL premix     2 g 200 mL/hr over 30 Minutes Intravenous Every 8 hours 05/29/17 2131 05/30/17 1105   05/29/17 1812  bacitracin 50,000 Units in sodium chloride irrigation 0.9 % 500 mL irrigation  Status:  Discontinued       As needed 05/29/17 1812 05/29/17 2021   05/29/17 1130  ceFAZolin (ANCEF) 3 g in dextrose 5 % 50 mL IVPB     3 g 130 mL/hr over 30 Minutes Intravenous To [facility] 05/28/17 1430 05/29/17 1555      Assessment/Plan: s/p Procedure(s): POSTERIOR CERVICAL FOUR- CERVICAL FIVE, CERVICAL FIVE- CERVICAL SIX,  CERVICAL SIX- CERVICAL SEVEN, CERVICAL SEVEN- THORACIC ONE, THORACIC ONE- THORACIC TWO, THORACIC TWO-THORACIC THREE SEGMENTAL FUSION No significant changes  LOS: 9 days   Marta LamasJames O. Gae BonWyatt, III, MD, FACS [phone] Trauma Surgeon 06/01/2017

## 2017-06-01 NOTE — Progress Notes (Signed)
Subjective: The patient is alert and pleasant.  He is in no apparent distress.  Family members are at the bedside.  Objective: Vital signs in last 24 hours: Temp:  [98 F (36.7 C)-101.8 F (38.8 C)] 98 F (36.7 C) (01/20 0835) Pulse Rate:  [71-86] 71 (01/20 0400) Resp:  [14-22] 18 (01/20 0400) BP: (105-128)/(49-50) 112/50 (01/19 2310) SpO2:  [95 %-100 %] 95 % (01/20 0400) Estimated body mass index is 39.33 kg/m as calculated from the following:   Height as of this encounter: 6' (1.829 m).   Weight as of this encounter: 131.5 kg (290 lb).   Intake/Output from previous day: 01/19 0701 - 01/20 0700 In: 2790 [P.O.:2460; I.V.:330] Out: 3200 [Urine:3200] Intake/Output this shift: No intake/output data recorded.  Physical exam patient is alert and oriented.  He is quadriplegic.  His dressing is clean and dry.  Lab Results: Recent Labs    05/31/17 0439 06/01/17 0501  WBC 17.1* 13.7*  HGB 9.3* 9.3*  HCT 27.7* 26.5*  PLT 218 232   BMET Recent Labs    05/31/17 0439 06/01/17 0501  NA 128* 126*  K 5.0 4.4  CL 98* 93*  CO2 23 23  GLUCOSE 102* 99  BUN 17 16  CREATININE 0.93 0.84  CALCIUM 8.2* 8.3*    Studies/Results: No results found.  Assessment/Plan: Postop day #3: He will need rehab.  LOS: 9 days     Keith [name] 06/01/2017, 9:04 AM     Patient ID: Keith [name], male   DOB: 10/31/1995, 22 y.o.   MRN: [id number]

## 2017-06-01 NOTE — Progress Notes (Signed)
CPAP set up (max 20.0, min 6.0 cm H20), placed via nasal mask.  Tolerating well at this time.

## 2017-06-02 LAB — CULTURE, BLOOD (SINGLE)
CULTURE: NO GROWTH
SPECIAL REQUESTS: ADEQUATE

## 2017-06-02 LAB — BASIC METABOLIC PANEL
ANION GAP: 8 (ref 5–15)
BUN: 14 mg/dL (ref 6–20)
CALCIUM: 8.4 mg/dL — AB (ref 8.9–10.3)
CO2: 23 mmol/L (ref 22–32)
Chloride: 95 mmol/L — ABNORMAL LOW (ref 101–111)
Creatinine, Ser: 0.77 mg/dL (ref 0.61–1.24)
Glucose, Bld: 109 mg/dL — ABNORMAL HIGH (ref 65–99)
POTASSIUM: 4.5 mmol/L (ref 3.5–5.1)
SODIUM: 126 mmol/L — AB (ref 135–145)

## 2017-06-02 MED ORDER — SODIUM CHLORIDE 1 G PO TABS
2.0000 g | ORAL_TABLET | Freq: Two times a day (BID) | ORAL | Status: DC
  Administered 2017-06-02 – 2017-06-11 (×15): 2 g via ORAL
  Filled 2017-06-02 (×20): qty 2

## 2017-06-02 MED ORDER — ENOXAPARIN SODIUM 80 MG/0.8ML ~~LOC~~ SOLN
0.5000 mg/kg | SUBCUTANEOUS | Status: DC
  Administered 2017-06-02 – 2017-06-12 (×11): 65 mg via SUBCUTANEOUS
  Filled 2017-06-02 (×14): qty 0.65

## 2017-06-02 MED ORDER — SODIUM CHLORIDE 0.9 % IV SOLN
INTRAVENOUS | Status: DC
  Administered 2017-06-02 – 2017-06-11 (×6): via INTRAVENOUS
  Filled 2017-06-02 (×5): qty 1000
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
## 2017-06-02 NOTE — Progress Notes (Addendum)
0700 Bedside shift report, pt sleeping, easy to arouse, pt c/o pain, will medicate when due. Pt falls back to sleep easily. Fall precautions in place, WCTM, dad at bedside.   0800 Pt assessed, see flow sheet. Pt medicated for pain, refused salt tab at this time as well as repositioning. No other complaints, WCTM.   1000 RN attempted to medicated pt with am meds, pt refused meds at this time, sleeping, will attempt when pt is awake.   1030 MD at bedside, Na still low, new orders received. Pt still sleeping, WCTM.   1145 Father giving pt a bath. Pt still refusing meds, stressed importance of Na Chloride tab. Pt doesn't like taking it and complaining about it the whole morning. Stated he would take it after his bath.   1230 RN attempted to give pt meds, pt sleeping and refusing to take meds. Will attempt again.   1330 Pt c/o pain, charge RN medicated. WCTM.   1500 Physical therapy in working with pt, wrapped bilateral legs, attempted to get up in maxilift but pad too small. Correct pad ordered but pt tired, in pain, and refusing to try again. Pt medicated for pain and with am meds around 1630. Dressing change performed to posterior left shoulder. Pt tolerated well. Pt's mother and father at bedside.   1800 Pt resting comfortably, mother feeding son dinner. NAD, no complaints, WCTM.   1900 Bedside shift [facility] Charity fundraiserN. Pt sleeping, easy to arouse, immediately asking for dilaudid, but falling back to sleep quickly. VSS, fall precautions in place.
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
## 2017-06-02 NOTE — Progress Notes (Signed)
1900: Handoff report received from RN. Pt sleeping throughout most of the day shift and refusing medications. When asked, pt reports being tired from working with physical therapy and refusing stool-related medications. I had a discussion with the pt and his mother about the importance of maintaining a sleep/wake schedule. Discussed plan of care for the shift; pt amenable to plan.  0000: Pt resting comfortably. Refusing turns. Discussed importance of mobility with pt and mother at bedside.  0400: Pt continues resting comfortably.  0700: Handoff report given to RN. No acute events overnight.
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
## 2017-06-03 LAB — BASIC METABOLIC PANEL
Anion gap: 12 (ref 5–15)
BUN: 16 mg/dL (ref 6–20)
CO2: 21 mmol/L — ABNORMAL LOW (ref 22–32)
Calcium: 8.6 mg/dL — ABNORMAL LOW (ref 8.9–10.3)
Chloride: 95 mmol/L — ABNORMAL LOW (ref 101–111)
Creatinine, Ser: 0.75 mg/dL (ref 0.61–1.24)
GFR calc Af Amer: >60 mL/min (ref >60)
GFR calc non Af Amer: >60 mL/min (ref >60)
Glucose, Bld: 102 mg/dL — ABNORMAL HIGH (ref 65–99)
Potassium: 4.6 mmol/L (ref 3.5–5.1)
Sodium: 128 mmol/L — ABNORMAL LOW (ref 135–145)

## 2017-06-03 LAB — CBC
HCT: 26.1 % — ABNORMAL LOW (ref 39.0–52.0)
Hemoglobin: 9 g/dL — ABNORMAL LOW (ref 13.0–17.0)
MCH: 28.8 pg (ref 26.0–34.0)
MCHC: 34.5 g/dL (ref 30.0–36.0)
MCV: 83.7 fL (ref 78.0–100.0)
Platelets: 361 10*3/uL (ref 150–400)
RBC: 3.12 MIL/uL — ABNORMAL LOW (ref 4.22–5.81)
RDW: 12.9 % (ref 11.5–15.5)
WBC: 8 10*3/uL (ref 4.0–10.5)

## 2017-06-03 MED ORDER — PRO-STAT SUGAR FREE PO LIQD
30.0000 mL | Freq: Every day | ORAL | Status: DC
  Administered 2017-06-03 – 2017-06-12 (×9): 30 mL via ORAL
  Filled 2017-06-03 (×7): qty 30

## 2017-06-03 MED ORDER — HYDROMORPHONE HCL 1 MG/ML IJ SOLN
0.5000 mg | INTRAMUSCULAR | Status: DC | PRN
  Administered 2017-06-03 – 2017-06-09 (×13): 0.5 mg via INTRAVENOUS
  Filled 2017-06-03 (×13): qty 0.5

## 2017-06-03 MED ORDER — IBUPROFEN 200 MG PO TABS
400.0000 mg | ORAL_TABLET | Freq: Four times a day (QID) | ORAL | Status: DC | PRN
  Administered 2017-06-07 – 2017-06-11 (×2): 400 mg via ORAL
  Filled 2017-06-03 (×2): qty 2

## 2017-06-03 MED ORDER — OXYCODONE HCL 5 MG PO TABS
5.0000 mg | ORAL_TABLET | ORAL | Status: DC | PRN
  Administered 2017-06-03: 5 mg via ORAL
  Administered 2017-06-03 (×2): 10 mg via ORAL
  Administered 2017-06-04: 5 mg via ORAL
  Administered 2017-06-04: 10 mg via ORAL
  Administered 2017-06-04: 5 mg via ORAL
  Administered 2017-06-04 – 2017-06-12 (×23): 10 mg via ORAL
  Filled 2017-06-03 (×5): qty 2
  Filled 2017-06-03: qty 1
  Filled 2017-06-03 (×18): qty 2
  Filled 2017-06-03: qty 1
  Filled 2017-06-03 (×3): qty 2
  Filled 2017-06-03: qty 1
  Filled 2017-06-03: qty 2
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
## 2017-06-03 NOTE — Progress Notes (Signed)
SLP Cancellation Note  Patient Details Name: Keith [name] MRN: [id number] DOB: 09/30/1995   Cancelled treatment:       Reason Eval/Treat Not Completed: Fatigue/lethargy limiting ability to participate;Other (comment) Pt sound asleep; Keith [name] asked to return next date.    [name], Risa Auman [name] 06/03/2017, 4:24 PM
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
## 2017-06-04 ENCOUNTER — Encounter (HOSPITAL_COMMUNITY): Payer: Self-pay | Admitting: Neurosurgery

## 2017-06-04 LAB — BASIC METABOLIC PANEL
ANION GAP: 11 (ref 5–15)
BUN: 16 mg/dL (ref 6–20)
CO2: 22 mmol/L (ref 22–32)
Calcium: 8.5 mg/dL — ABNORMAL LOW (ref 8.9–10.3)
Chloride: 97 mmol/L — ABNORMAL LOW (ref 101–111)
Creatinine, Ser: 0.81 mg/dL (ref 0.61–1.24)
Glucose, Bld: 101 mg/dL — ABNORMAL HIGH (ref 65–99)
Potassium: 4.9 mmol/L (ref 3.5–5.1)
Sodium: 130 mmol/L — ABNORMAL LOW (ref 135–145)

## 2017-06-04 LAB — CBC
HCT: 25.6 % — ABNORMAL LOW (ref 39.0–52.0)
HEMOGLOBIN: 8.7 g/dL — AB (ref 13.0–17.0)
MCH: 28.7 pg (ref 26.0–34.0)
MCHC: 34 g/dL (ref 30.0–36.0)
MCV: 84.5 fL (ref 78.0–100.0)
Platelets: 397 10*3/uL (ref 150–400)
RBC: 3.03 MIL/uL — AB (ref 4.22–5.81)
RDW: 12.8 % (ref 11.5–15.5)
WBC: 9.2 10*3/uL (ref 4.0–10.5)
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
## 2017-06-04 NOTE — Progress Notes (Signed)
I arrived for family meeting, girlfriend, Randa EvensSimone, at bedside. We contacted pt's Mom by phone and she was unaware from pt's sister that a meeting to discuss pt's rehab options was planned for 4 pm. We have rescheduled meeting for 1030 tomorrow. [phone]

## 2017-06-04 NOTE — Progress Notes (Signed)
I met with pt and his sister at bedside to arrange a 4 pm meeting to discuss with pt and family a possible inpt rehab admit pending insurance approval and bed availability. [phone]
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
## 2017-06-05 LAB — BASIC METABOLIC PANEL
ANION GAP: 10 (ref 5–15)
BUN: 14 mg/dL (ref 6–20)
CALCIUM: 8.4 mg/dL — AB (ref 8.9–10.3)
CO2: 21 mmol/L — ABNORMAL LOW (ref 22–32)
Chloride: 96 mmol/L — ABNORMAL LOW (ref 101–111)
Creatinine, Ser: 0.81 mg/dL (ref 0.61–1.24)
GLUCOSE: 100 mg/dL — AB (ref 65–99)
Potassium: 4.6 mmol/L (ref 3.5–5.1)
SODIUM: 127 mmol/L — AB (ref 135–145)

## 2017-06-05 MED ORDER — CYCLOBENZAPRINE HCL 5 MG PO TABS
7.5000 mg | ORAL_TABLET | Freq: Three times a day (TID) | ORAL | Status: DC | PRN
  Administered 2017-06-05 – 2017-06-08 (×2): 7.5 mg via ORAL
  Administered 2017-06-10: 5 mg via ORAL
  Administered 2017-06-12: 7.5 mg via ORAL
  Filled 2017-06-05 (×7): qty 1.5
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
## 2017-06-05 NOTE — Progress Notes (Signed)
RN offered to repositioned and turn patient multiple times since this shift start but pt declined at this time. Family at bedside.  Sim BoastHavy, RN
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
## 2017-06-06 LAB — BASIC METABOLIC PANEL
ANION GAP: 10 (ref 5–15)
BUN: 14 mg/dL (ref 6–20)
CO2: 22 mmol/L (ref 22–32)
CREATININE: 0.77 mg/dL (ref 0.61–1.24)
Calcium: 8.6 mg/dL — ABNORMAL LOW (ref 8.9–10.3)
Chloride: 98 mmol/L — ABNORMAL LOW (ref 101–111)
GFR calc Af Amer: >60 mL/min (ref >60)
GLUCOSE: 95 mg/dL (ref 65–99)
Potassium: 4.8 mmol/L (ref 3.5–5.1)
Sodium: 130 mmol/L — ABNORMAL LOW (ref 135–145)

## 2017-06-06 NOTE — Progress Notes (Signed)
I met with pt and his Mom at bedside. We again discussed short term goals of an inpt rehab admit for 2 to 3 weeks with goal of pt rehab and caregiver education with plans to d/c home after a rehab here. Also discussed the need for pt to request and allow turning in bed every 2 hrs and as needed as well as to not be in need of IV pain meds. I have discussed case with Dr. Naaman Plummer and I will follow up Monday. [employer] is questioning pt's ability to tolerate inpt rehab level yet with his blood pressure. I again requested Mom to provide [employer] information from home so that I can pursue secondary insurance approval if needed, also to his [place]. She states pt's Dad will obtain that today. [phone]
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
## 2017-06-06 NOTE — Plan of Care (Signed)
OOB TO CHAIR TOTAL ASSIST, MAXI LIFT
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
## 2017-06-07 LAB — BASIC METABOLIC PANEL
ANION GAP: 10 (ref 5–15)
BUN: 14 mg/dL (ref 6–20)
CALCIUM: 8.7 mg/dL — AB (ref 8.9–10.3)
CO2: 20 mmol/L — ABNORMAL LOW (ref 22–32)
Chloride: 102 mmol/L (ref 101–111)
Creatinine, Ser: 0.78 mg/dL (ref 0.61–1.24)
GFR calc Af Amer: >60 mL/min (ref >60)
Glucose, Bld: 94 mg/dL (ref 65–99)
POTASSIUM: 4.8 mmol/L (ref 3.5–5.1)
Sodium: 132 mmol/L — ABNORMAL LOW (ref 135–145)

## 2017-06-07 NOTE — Progress Notes (Signed)
1900: Handoff report received from RN. Pt c/o pain after being washed up. Otherwise denies needs. Appears in good spirits and more alert than last weekend. Discussed plan of care for the shift with pt and his father at the bedside. Pt amenable to plan.  0000: Pt resting comfortably.  0400: Pt continues resting comfortably.  0700: Handoff report given to RN. No acute events overnight.
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
## 2017-06-08 LAB — CBC
HCT: 29.2 % — ABNORMAL LOW (ref 39.0–52.0)
HEMOGLOBIN: 9.9 g/dL — AB (ref 13.0–17.0)
MCH: 29.3 pg (ref 26.0–34.0)
MCHC: 33.9 g/dL (ref 30.0–36.0)
MCV: 86.4 fL (ref 78.0–100.0)
Platelets: 489 10*3/uL — ABNORMAL HIGH (ref 150–400)
RBC: 3.38 MIL/uL — AB (ref 4.22–5.81)
RDW: 13.6 % (ref 11.5–15.5)
WBC: 8.5 10*3/uL (ref 4.0–10.5)

## 2017-06-08 MED ORDER — NYSTATIN 100000 UNIT/ML MT SUSP
5.0000 mL | Freq: Four times a day (QID) | OROMUCOSAL | Status: DC
  Administered 2017-06-08 – 2017-06-12 (×13): 500000 [IU] via ORAL
  Filled 2017-06-08 (×12): qty 5

## 2017-06-08 MED ORDER — CIPROFLOXACIN HCL 500 MG PO TABS
500.0000 mg | ORAL_TABLET | Freq: Two times a day (BID) | ORAL | Status: DC
  Administered 2017-06-08 – 2017-06-11 (×7): 500 mg via ORAL
  Filled 2017-06-08 (×7): qty 1
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
## 2017-06-08 NOTE — Progress Notes (Signed)
1900: Handoff report received from RN. Pt continues refusing the majority of his turns; able to verbalize risks. Discussed plan of care for the shift with pt; amenable to plan.  0000: Pt resting comfortably.  0400: Pt continues resting comfortably.  0700: Handoff report given to RN. No acute events overnight.

## 2017-06-09 LAB — BASIC METABOLIC PANEL
Anion gap: 11 (ref 5–15)
BUN: 17 mg/dL (ref 6–20)
CALCIUM: 8.8 mg/dL — AB (ref 8.9–10.3)
CO2: 19 mmol/L — ABNORMAL LOW (ref 22–32)
CREATININE: 0.8 mg/dL (ref 0.61–1.24)
Chloride: 100 mmol/L — ABNORMAL LOW (ref 101–111)
GFR calc Af Amer: >60 mL/min (ref >60)
GLUCOSE: 90 mg/dL (ref 65–99)
Potassium: 4.6 mmol/L (ref 3.5–5.1)
SODIUM: 130 mmol/L — AB (ref 135–145)

## 2017-06-09 LAB — URINE CULTURE: Culture: >=100000 — AB
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
## 2017-06-09 NOTE — Progress Notes (Signed)
1900: Handoff report received from RN. Discussed plan of care for the shift with pt; amenable to plan.  0000: Pt resting comfortably.  0400: Pt continues resting comfortably.  0700: Handoff report given to RN. No acute events overnight.

## 2017-06-09 NOTE — Progress Notes (Signed)
I contacted Trauma PA, Kelli, to clarify weight bearing restrictions to BUE. Currently PT and OT are NWB. [phone]

## 2017-06-09 NOTE — Clinical Social Work Note (Signed)
SBIRT completed on 05/31/17. Clinical Social Worker will sign off for now as social work intervention is no longer needed. Please consult us again if new need arises.  SummersBridget Filemon [place], [place] [phone]
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
## 2017-06-09 NOTE — Progress Notes (Signed)
PT Cancellation Note  Patient Details Name: Keith [name] MRN: [id number] DOB: 11/26/1995   Cancelled Treatment:    Reason Eval/Treat Not Completed: Other (comment)(pt fatigued, snoring and mom requested not to wake him)   Bayron Dalto B Marilena Trevathan 06/09/2017, 12:49 PM  [name] Maanvi Lecompte, PT [phone]
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
## 2017-06-09 NOTE — Progress Notes (Signed)
Every other staple removed from posterior neck/upper back. Should be able to remove remaining staples 1/31.  Brooke A Meuth
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
## 2017-06-11 LAB — URINALYSIS, ROUTINE W REFLEX MICROSCOPIC
BILIRUBIN URINE: NEGATIVE
Glucose, UA: NEGATIVE mg/dL
HGB URINE DIPSTICK: NEGATIVE
Ketones, ur: NEGATIVE mg/dL
Leukocytes, UA: NEGATIVE
NITRITE: NEGATIVE
PROTEIN: NEGATIVE mg/dL
Specific Gravity, Urine: 1.016 (ref 1.005–1.030)
pH: 6 (ref 5.0–8.0)

## 2017-06-11 LAB — SODIUM, URINE, RANDOM: SODIUM UR: 85 mmol/L

## 2017-06-11 LAB — OSMOLALITY, URINE: OSMOLALITY UR: 582 mosm/kg (ref 300–900)

## 2017-06-11 MED ORDER — CEPHALEXIN 500 MG PO CAPS
500.0000 mg | ORAL_CAPSULE | Freq: Two times a day (BID) | ORAL | Status: DC
  Administered 2017-06-11 – 2017-06-12 (×2): 500 mg via ORAL
  Filled 2017-06-11 (×2): qty 1

## 2017-06-11 NOTE — Progress Notes (Addendum)
I met with Dr. Naaman Plummer and he is in agreement to pursue insurance approval for admission. I met with Keith [name] and his Dad at bedside to inform them of decision. Dad provided me with Keith [name]'s secondary insurance as he also works for [employer]. I will begin insurance authorization for a possible inpt rehab admission pending insurance approval at this time. I contacted Brooke, Trauma PA and she is aware of the plan. [phone]
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
## 2017-06-11 NOTE — Progress Notes (Addendum)
I was asked by pt's Mom to come to pt's room. She asks for clarification of why patient had not been moved to CIR yet today since his insurance will cover it 100%. I clarified that I had reviewed his insurance benefits with her husband this morning that they cover 100% of facility charges after a $100 deductible once approved. That insurance was initiated this morning after Dr. Riley KillSwartz agreed to offer a bed for admission, but insurance approval was pending. Mom also stating that she did not trust what we are doing for he better be offered all resources he can have or we would be negligent. I again reiterated, that equipment and our inpatient acute rehab recommendations could not be pursued until we had admitted him and worked with him to know what DME he needed. I offered for her to discuss her concerns with the Trauma team if she would like to pursue admission elsewhere. She states she would like [facility]. I again clarified our goal for a 2 to 3 week admission with rehab for pt and caregiver education to prepare them for d/c home. Rehab efforts would be months long but not inpatient throughout his whole course of recovery. I continue to await insurance approval for admission which I initiated this morning after discussing his case with Dr. Riley KillSwartz. [phone]

## 2017-06-11 NOTE — Progress Notes (Addendum)
Pt transferred back to bed. No issues with transfer using lift in room.   Attempted to position patient off sacrum but patient refused. States "I don't want to be turned right now."  Mom has verbalized many times this shift that "they haven't been turning him like they supposed to."   Pt and mom educated, mom states "he will turn before the end of your shift."

## 2017-06-11 NOTE — Plan of Care (Signed)
Complications of condition being minimized. Care team attempted to turn q2h, foot drop boots on, SCD's being utilized, etc.
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
## 2017-06-11 NOTE — Progress Notes (Signed)
Pt assisted out of bed with lift. 2+ assist to chair. Per mom and pts request.  Pt c/o feeling extremely light headed and ringing to ears bilaterally. BP 77/43 HR 85 when fist sitting in chair. Feet elevated and BP 70/35 HR 85. Head slightly lowered and BP 80/32 HR 85. After 3 min pt reported improvement in symptoms. Reports lightheaded feeling ceased as well as ringing in ears. After sitting for 10 min BP improved to 99/52. Nursing to continue to monitor.

## 2017-06-11 NOTE — Progress Notes (Signed)
Pt's mom entered room and began removing medical equipment from patient. RN had just applied wrist splint to L arm. Mom stated "it stinks, I'm going to take that off to wash your hand." Mom also removed foot drop boots stating that the patient is hot. Mom encouraged to adjust temperature in room and mom stated "well he was cold, I've been here for about 3 weeks and I Know about all that y'all are doing, some of this stuff needs a break." Mom states "I'm tired of seeing him lay here and suffer and its too hot for some of this stuff." Mom educated on reasons for all of medical equipment and she verbalized understanding but hesitant to agree with treatment plan. Nursing will continue to educate and monitor.
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
## 2017-06-12 ENCOUNTER — Inpatient Hospital Stay (HOSPITAL_COMMUNITY)
Admission: RE | Admit: 2017-06-12 | Payer: BLUE CROSS/BLUE SHIELD | Source: Intra-hospital | Admitting: Physical Medicine & Rehabilitation

## 2017-06-12 ENCOUNTER — Inpatient Hospital Stay (HOSPITAL_COMMUNITY)
Admission: RE | Admit: 2017-06-12 | Discharge: 2017-07-11 | DRG: 945 | Disposition: A | Discharge status: Home Health | Payer: BLUE CROSS/BLUE SHIELD | Source: Intra-hospital | Attending: Physical Medicine & Rehabilitation | Admitting: Physical Medicine & Rehabilitation

## 2017-06-12 ENCOUNTER — Encounter (HOSPITAL_COMMUNITY): Payer: Self-pay

## 2017-06-12 ENCOUNTER — Encounter (HOSPITAL_COMMUNITY): Payer: Self-pay | Admitting: Emergency Medicine

## 2017-06-12 ENCOUNTER — Other Ambulatory Visit: Payer: Self-pay

## 2017-06-12 DIAGNOSIS — N390 Urinary tract infection, site not specified: Secondary | ICD-10-CM | POA: Diagnosis present

## 2017-06-12 DIAGNOSIS — E871 Hypo-osmolality and hyponatremia: Secondary | ICD-10-CM | POA: Diagnosis present

## 2017-06-12 DIAGNOSIS — M62838 Other muscle spasm: Secondary | ICD-10-CM | POA: Diagnosis present

## 2017-06-12 DIAGNOSIS — L739 Follicular disorder, unspecified: Secondary | ICD-10-CM | POA: Diagnosis not present

## 2017-06-12 DIAGNOSIS — M792 Neuralgia and neuritis, unspecified: Secondary | ICD-10-CM | POA: Diagnosis not present

## 2017-06-12 DIAGNOSIS — I82619 Acute embolism and thrombosis of superficial veins of unspecified upper extremity: Secondary | ICD-10-CM | POA: Diagnosis present

## 2017-06-12 DIAGNOSIS — D62 Acute posthemorrhagic anemia: Secondary | ICD-10-CM

## 2017-06-12 DIAGNOSIS — W3400XA Accidental discharge from unspecified firearms or gun, initial encounter: Secondary | ICD-10-CM | POA: Diagnosis not present

## 2017-06-12 DIAGNOSIS — S14155D Other incomplete lesion at C5 level of cervical spinal cord, subsequent encounter: Principal | ICD-10-CM

## 2017-06-12 DIAGNOSIS — F17210 Nicotine dependence, cigarettes, uncomplicated: Secondary | ICD-10-CM | POA: Diagnosis present

## 2017-06-12 DIAGNOSIS — K592 Neurogenic bowel, not elsewhere classified: Secondary | ICD-10-CM

## 2017-06-12 DIAGNOSIS — G8918 Other acute postprocedural pain: Secondary | ICD-10-CM

## 2017-06-12 DIAGNOSIS — G8254 Quadriplegia, C5-C7 incomplete: Secondary | ICD-10-CM | POA: Diagnosis present

## 2017-06-12 DIAGNOSIS — Z981 Arthrodesis status: Secondary | ICD-10-CM | POA: Diagnosis not present

## 2017-06-12 DIAGNOSIS — G8250 Quadriplegia, unspecified: Secondary | ICD-10-CM

## 2017-06-12 DIAGNOSIS — S62501D Fracture of unspecified phalanx of right thumb, subsequent encounter for fracture with routine healing: Secondary | ICD-10-CM

## 2017-06-12 DIAGNOSIS — N319 Neuromuscular dysfunction of bladder, unspecified: Secondary | ICD-10-CM

## 2017-06-12 DIAGNOSIS — B9620 Unspecified Escherichia coli [E. coli] as the cause of diseases classified elsewhere: Secondary | ICD-10-CM | POA: Diagnosis present

## 2017-06-12 DIAGNOSIS — F17290 Nicotine dependence, other tobacco product, uncomplicated: Secondary | ICD-10-CM | POA: Diagnosis present

## 2017-06-12 DIAGNOSIS — S14156D Other incomplete lesion at C6 level of cervical spinal cord, subsequent encounter: Secondary | ICD-10-CM | POA: Diagnosis present

## 2017-06-12 DIAGNOSIS — S14109S Unspecified injury at unspecified level of cervical spinal cord, sequela: Secondary | ICD-10-CM | POA: Diagnosis not present

## 2017-06-12 DIAGNOSIS — F4310 Post-traumatic stress disorder, unspecified: Secondary | ICD-10-CM

## 2017-06-12 DIAGNOSIS — S42102D Fracture of unspecified part of scapula, left shoulder, subsequent encounter for fracture with routine healing: Secondary | ICD-10-CM | POA: Diagnosis not present

## 2017-06-12 DIAGNOSIS — G8220 Paraplegia, unspecified: Secondary | ICD-10-CM | POA: Diagnosis present

## 2017-06-12 DIAGNOSIS — S14109D Unspecified injury at unspecified level of cervical spinal cord, subsequent encounter: Secondary | ICD-10-CM | POA: Diagnosis not present

## 2017-06-12 LAB — BASIC METABOLIC PANEL
Anion gap: 9 (ref 5–15)
BUN: 20 mg/dL (ref 6–20)
CHLORIDE: 102 mmol/L (ref 101–111)
CO2: 21 mmol/L — AB (ref 22–32)
CREATININE: 0.81 mg/dL (ref 0.61–1.24)
Calcium: 9.2 mg/dL (ref 8.9–10.3)
GFR calc Af Amer: >60 mL/min (ref >60)
GFR calc non Af Amer: >60 mL/min (ref >60)
GLUCOSE: 89 mg/dL (ref 65–99)
Potassium: 4.6 mmol/L (ref 3.5–5.1)
Sodium: 132 mmol/L — ABNORMAL LOW (ref 135–145)

## 2017-06-12 LAB — CBC
HEMATOCRIT: 29.8 % — AB (ref 39.0–52.0)
HEMATOCRIT: 29.8 % — AB (ref 39.0–52.0)
Hemoglobin: 10.1 g/dL — ABNORMAL LOW (ref 13.0–17.0)
Hemoglobin: 10 g/dL — ABNORMAL LOW (ref 13.0–17.0)
MCH: 28.9 pg (ref 26.0–34.0)
MCH: 28.7 pg (ref 26.0–34.0)
MCHC: 33.9 g/dL (ref 30.0–36.0)
MCHC: 33.6 g/dL (ref 30.0–36.0)
MCV: 85.4 fL (ref 78.0–100.0)
MCV: 85.4 fL (ref 78.0–100.0)
Platelets: 386 10*3/uL (ref 150–400)
Platelets: 390 10*3/uL (ref 150–400)
RBC: 3.49 MIL/uL — ABNORMAL LOW (ref 4.22–5.81)
RBC: 3.49 MIL/uL — AB (ref 4.22–5.81)
RDW: 13.5 % (ref 11.5–15.5)
RDW: 13.4 % (ref 11.5–15.5)
WBC: 8.8 10*3/uL (ref 4.0–10.5)
WBC: 9.1 10*3/uL (ref 4.0–10.5)

## 2017-06-12 LAB — CREATININE, SERUM: Creatinine, Ser: 0.76 mg/dL (ref 0.61–1.24)

## 2017-06-12 MED ORDER — ONDANSETRON HCL 4 MG/2ML IJ SOLN: 4.0000 mg | Freq: Four times a day (QID) | INTRAMUSCULAR | Status: DC | PRN

## 2017-06-12 MED ORDER — SENNA 8.6 MG PO TABS
1.0000 | ORAL_TABLET | Freq: Two times a day (BID) | ORAL | Status: DC
  Administered 2017-06-13: 8.6 mg via ORAL
  Filled 2017-06-12 (×2): qty 1

## 2017-06-12 MED ORDER — ACETAMINOPHEN 325 MG PO TABS
650.0000 mg | ORAL_TABLET | Freq: Four times a day (QID) | ORAL | Status: DC
  Administered 2017-06-12 – 2017-07-05 (×58): 650 mg via ORAL
  Filled 2017-06-12 (×76): qty 2

## 2017-06-12 MED ORDER — BISACODYL 10 MG RE SUPP
10.0000 mg | Freq: Every day | RECTAL | Status: DC | PRN
  Filled 2017-06-12: qty 1

## 2017-06-12 MED ORDER — ENOXAPARIN SODIUM 80 MG/0.8ML ~~LOC~~ SOLN: 0.5000 mg/kg | SUBCUTANEOUS | Status: DC

## 2017-06-12 MED ORDER — DOCUSATE SODIUM 100 MG PO CAPS
100.0000 mg | ORAL_CAPSULE | Freq: Two times a day (BID) | ORAL | Status: DC
  Administered 2017-06-13: 100 mg via ORAL
  Filled 2017-06-12 (×2): qty 1

## 2017-06-12 MED ORDER — CEPHALEXIN 250 MG PO CAPS
500.0000 mg | ORAL_CAPSULE | Freq: Two times a day (BID) | ORAL | Status: AC
  Administered 2017-06-12 – 2017-06-15 (×7): 500 mg via ORAL
  Filled 2017-06-12 (×7): qty 2

## 2017-06-12 MED ORDER — CYCLOBENZAPRINE HCL 5 MG PO TABS
7.5000 mg | ORAL_TABLET | Freq: Three times a day (TID) | ORAL | Status: DC | PRN
  Administered 2017-06-13 – 2017-07-02 (×21): 7.5 mg via ORAL
  Filled 2017-06-12 (×23): qty 2

## 2017-06-12 MED ORDER — ENSURE ENLIVE PO LIQD
237.0000 mL | Freq: Three times a day (TID) | ORAL | Status: DC
  Administered 2017-06-12 – 2017-07-09 (×27): 237 mL via ORAL

## 2017-06-12 MED ORDER — ZOLPIDEM TARTRATE 5 MG PO TABS: 5.0000 mg | ORAL_TABLET | Freq: Every evening | ORAL | Status: DC | PRN

## 2017-06-12 MED ORDER — IBUPROFEN 400 MG PO TABS
400.0000 mg | ORAL_TABLET | Freq: Four times a day (QID) | ORAL | Status: DC | PRN
  Administered 2017-06-23: 400 mg via ORAL
  Filled 2017-06-12 (×2): qty 1

## 2017-06-12 MED ORDER — NYSTATIN 100000 UNIT/ML MT SUSP
5.0000 mL | Freq: Four times a day (QID) | OROMUCOSAL | Status: DC
  Administered 2017-06-12 – 2017-06-14 (×5): 500000 [IU] via ORAL
  Administered 2017-06-14: 5 mL via ORAL
  Administered 2017-06-15 – 2017-06-19 (×7): 500000 [IU] via ORAL
  Filled 2017-06-12 (×28): qty 5

## 2017-06-12 MED ORDER — DOUBLE ANTIBIOTIC 500-10000 UNIT/GM EX OINT
TOPICAL_OINTMENT | Freq: Two times a day (BID) | CUTANEOUS | Status: DC
  Administered 2017-06-12 – 2017-06-21 (×15): via TOPICAL
  Filled 2017-06-12: qty 28.4

## 2017-06-12 MED ORDER — ONDANSETRON HCL 4 MG PO TABS
4.0000 mg | ORAL_TABLET | Freq: Four times a day (QID) | ORAL | Status: DC | PRN
  Administered 2017-06-12: 4 mg via ORAL
  Filled 2017-06-12: qty 1

## 2017-06-12 MED ORDER — ONDANSETRON HCL 4 MG PO TABS: 4.0000 mg | ORAL_TABLET | Freq: Four times a day (QID) | ORAL | Status: DC | PRN

## 2017-06-12 MED ORDER — NAPHAZOLINE-PHENIRAMINE 0.025-0.3 % OP SOLN
1.0000 [drp] | Freq: Four times a day (QID) | OPHTHALMIC | Status: DC | PRN
  Filled 2017-06-12: qty 5

## 2017-06-12 MED ORDER — GABAPENTIN 300 MG PO CAPS
300.0000 mg | ORAL_CAPSULE | Freq: Three times a day (TID) | ORAL | Status: DC
  Administered 2017-06-12 – 2017-07-11 (×86): 300 mg via ORAL
  Filled 2017-06-12 (×86): qty 1

## 2017-06-12 MED ORDER — SORBITOL 70 % SOLN: 30.0000 mL | Freq: Every day | Status: DC | PRN

## 2017-06-12 MED ORDER — OXYCODONE HCL 5 MG PO TABS
5.0000 mg | ORAL_TABLET | ORAL | Status: DC | PRN
  Administered 2017-06-12 – 2017-06-17 (×13): 10 mg via ORAL
  Administered 2017-06-18: 5 mg via ORAL
  Administered 2017-06-20 – 2017-06-23 (×3): 10 mg via ORAL
  Administered 2017-06-23: 5 mg via ORAL
  Administered 2017-06-24 – 2017-06-28 (×5): 10 mg via ORAL
  Administered 2017-07-04: 5 mg via ORAL
  Administered 2017-07-06 – 2017-07-07 (×2): 10 mg via ORAL
  Administered 2017-07-08: 5 mg via ORAL
  Administered 2017-07-08: 10 mg via ORAL
  Administered 2017-07-10: 5 mg via ORAL
  Administered 2017-07-10 – 2017-07-11 (×2): 10 mg via ORAL
  Filled 2017-06-12 (×2): qty 2
  Filled 2017-06-12: qty 1
  Filled 2017-06-12: qty 2
  Filled 2017-06-12: qty 1
  Filled 2017-06-12 (×3): qty 2
  Filled 2017-06-12: qty 1
  Filled 2017-06-12 (×5): qty 2
  Filled 2017-06-12: qty 1
  Filled 2017-06-12 (×6): qty 2
  Filled 2017-06-12: qty 1
  Filled 2017-06-12 (×3): qty 2
  Filled 2017-06-12: qty 1
  Filled 2017-06-12 (×5): qty 2
  Filled 2017-06-12: qty 1
  Filled 2017-06-12: qty 2

## 2017-06-12 MED ORDER — POLYETHYLENE GLYCOL 3350 17 G PO PACK
17.0000 g | PACK | Freq: Two times a day (BID) | ORAL | Status: DC
  Administered 2017-06-14 – 2017-06-29 (×25): 17 g via ORAL
  Filled 2017-06-12 (×32): qty 1

## 2017-06-12 MED ORDER — PRO-STAT SUGAR FREE PO LIQD
30.0000 mL | Freq: Every day | ORAL | Status: DC
  Administered 2017-06-13 – 2017-07-11 (×28): 30 mL via ORAL
  Filled 2017-06-12 (×27): qty 30

## 2017-06-12 MED ORDER — PANTOPRAZOLE SODIUM 40 MG PO TBEC
40.0000 mg | DELAYED_RELEASE_TABLET | Freq: Every day | ORAL | Status: DC
  Administered 2017-06-13 – 2017-07-11 (×29): 40 mg via ORAL
  Filled 2017-06-12 (×29): qty 1

## 2017-06-12 MED ORDER — ENOXAPARIN SODIUM 80 MG/0.8ML ~~LOC~~ SOLN
65.0000 mg | SUBCUTANEOUS | Status: DC
  Administered 2017-06-13 – 2017-06-17 (×5): 65 mg via SUBCUTANEOUS
  Filled 2017-06-12 (×6): qty 0.65
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
## 2017-06-12 NOTE — Progress Notes (Signed)
Patient arrived to unit with parents. Belongings unpacked, no complaints of pain. Oriented to unit. Resting comfortably with parent at bedside.
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
## 2017-06-13 ENCOUNTER — Inpatient Hospital Stay (HOSPITAL_COMMUNITY): Payer: Self-pay

## 2017-06-13 ENCOUNTER — Inpatient Hospital Stay (HOSPITAL_COMMUNITY): Payer: BLUE CROSS/BLUE SHIELD | Admitting: Occupational Therapy

## 2017-06-13 ENCOUNTER — Inpatient Hospital Stay (HOSPITAL_COMMUNITY): Payer: BLUE CROSS/BLUE SHIELD

## 2017-06-13 ENCOUNTER — Inpatient Hospital Stay (HOSPITAL_COMMUNITY): Payer: BLUE CROSS/BLUE SHIELD | Admitting: Physical Therapy

## 2017-06-13 DIAGNOSIS — G8220 Paraplegia, unspecified: Secondary | ICD-10-CM

## 2017-06-13 LAB — CBC WITH DIFFERENTIAL/PLATELET
BASOS PCT: 0 %
Basophils Absolute: 0 10*3/uL (ref 0.0–0.1)
EOS ABS: 0.1 10*3/uL (ref 0.0–0.7)
Eosinophils Relative: 1 %
HCT: 30.2 % — ABNORMAL LOW (ref 39.0–52.0)
HEMOGLOBIN: 10.1 g/dL — AB (ref 13.0–17.0)
LYMPHS ABS: 2.1 10*3/uL (ref 0.7–4.0)
Lymphocytes Relative: 27 %
MCH: 28.9 pg (ref 26.0–34.0)
MCHC: 33.4 g/dL (ref 30.0–36.0)
MCV: 86.5 fL (ref 78.0–100.0)
Monocytes Absolute: 0.9 10*3/uL (ref 0.1–1.0)
Monocytes Relative: 11 %
NEUTROS ABS: 4.8 10*3/uL (ref 1.7–7.7)
Neutrophils Relative %: 61 %
Platelets: 408 10*3/uL — ABNORMAL HIGH (ref 150–400)
RBC: 3.49 MIL/uL — AB (ref 4.22–5.81)
RDW: 13.8 % (ref 11.5–15.5)
WBC: 7.8 10*3/uL (ref 4.0–10.5)

## 2017-06-13 LAB — COMPREHENSIVE METABOLIC PANEL
ALBUMIN: 2.7 g/dL — AB (ref 3.5–5.0)
ALT: 175 U/L — AB (ref 17–63)
AST: 75 U/L — ABNORMAL HIGH (ref 15–41)
Alkaline Phosphatase: 90 U/L (ref 38–126)
Anion gap: 10 (ref 5–15)
BUN: 21 mg/dL — AB (ref 6–20)
CO2: 21 mmol/L — AB (ref 22–32)
CREATININE: 0.78 mg/dL (ref 0.61–1.24)
Calcium: 9.2 mg/dL (ref 8.9–10.3)
Chloride: 105 mmol/L (ref 101–111)
GFR calc Af Amer: >60 mL/min (ref >60)
GFR calc non Af Amer: >60 mL/min (ref >60)
GLUCOSE: 89 mg/dL (ref 65–99)
Potassium: 4.7 mmol/L (ref 3.5–5.1)
SODIUM: 136 mmol/L (ref 135–145)
Total Bilirubin: 0.7 mg/dL (ref 0.3–1.2)
Total Protein: 6.8 g/dL (ref 6.5–8.1)

## 2017-06-13 MED ORDER — SALINE SPRAY 0.65 % NA SOLN
1.0000 | NASAL | Status: DC | PRN
  Administered 2017-06-13: 1 via NASAL
  Filled 2017-06-13: qty 44

## 2017-06-13 MED ORDER — SENNOSIDES-DOCUSATE SODIUM 8.6-50 MG PO TABS
2.0000 | ORAL_TABLET | Freq: Every day | ORAL | Status: DC
  Administered 2017-06-15 – 2017-06-16 (×2): 2 via ORAL
  Filled 2017-06-13 (×4): qty 2

## 2017-06-13 MED ORDER — BISACODYL 10 MG RE SUPP
10.0000 mg | Freq: Every day | RECTAL | Status: DC
  Administered 2017-06-17 – 2017-06-28 (×7): 10 mg via RECTAL
  Filled 2017-06-13 (×14): qty 1

## 2017-06-13 MED ORDER — FERROUS SULFATE 325 (65 FE) MG PO TABS
325.0000 mg | ORAL_TABLET | Freq: Every day | ORAL | Status: DC
Start: 2017-06-14 — End: 2017-07-11
  Administered 2017-06-14 – 2017-07-11 (×28): 325 mg via ORAL
  Filled 2017-06-13 (×28): qty 1
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
## 2017-06-13 NOTE — IPOC Note (Signed)
Overall Plan of Care [facility]) Patient Details Name: Keith [name] MRN: [id number] DOB: [birth year]  Admitting Diagnosis: <principal problem not specified>cervical sci  Hospital Problems: Active Problems:   Paraplegia [facility])     Functional Problem List: Nursing Bladder, Bowel, Endurance, Medication Management, Motor, Pain, Perception, Skin Integrity, Sensory  PT Balance, Endurance, Motor, Pain, Safety, Sensory  OT Balance, Endurance, Motor, Pain, Safety, Sensory, Skin Integrity  SLP    TR         Basic ADL's: OT Eating, Grooming, Bathing, Dressing, Toileting     Advanced  ADL's: OT       Transfers: PT Bed Mobility, Bed to Chair, Customer service manager, Tub/Shower     Locomotion: PT Wheelchair Mobility     Additional Impairments: OT    SLP        TR      Anticipated Outcomes Item Anticipated Outcome  Self Feeding Set up  Swallowing      Basic self-care  Set up; UB dressing-min A; no LB dressing goal-rolling B during ADLs with min A  Toileting  toileting n/a; bathing MOD A   Bathroom Transfers toilet transfer MAX A  Bowel/Bladder  maintain regular bowel and bladder emptying   Transfers  maxA   Locomotion  modA WC short distances   Communication     Cognition     Pain  remain pain free or less than 3  Safety/Judgment  Remain free of falls, skin breakdown and infection   Therapy Plan: PT Intensity: Minimum of 1-2 x/day ,45 to 90 minutes PT Frequency: 5 out of 7 days PT Duration Estimated Length of Stay: 21-28 days OT Intensity: Minimum of 1-2 x/day, 45 to 90 minutes OT Frequency: 5 out of 7 days OT Duration/Estimated Length of Stay: 21-28      Team Interventions: Nursing Interventions Patient/Family Education, Bladder Management, Bowel Management, Medication Management, Skin Care/Wound Management, Discharge Planning, Psychosocial Support  PT interventions Balance/vestibular training, Discharge planning, Functional mobility training, Therapeutic  Activities, Neuromuscular re-education, Therapeutic Exercise, Wheelchair propulsion/positioning, DME/adaptive equipment instruction, Pain management, Splinting/orthotics, UE/LE Strength taining/ROM, Patient/family education, Functional electrical stimulation, UE/LE Coordination activities  OT Interventions Balance/vestibular training, Discharge planning, Functional electrical stimulation, Pain management, Self Care/advanced ADL retraining, Therapeutic Activities, UE/LE Coordination activities, Disease mangement/prevention, Functional mobility training, Patient/family education, Skin care/wound managment, Therapeutic Exercise, Community reintegration, DME/adaptive equipment instruction, Neuromuscular re-education, Psychosocial support, Splinting/orthotics, UE/LE Strength taining/ROM, Wheelchair propulsion/positioning  SLP Interventions    TR Interventions    SW/CM Interventions Discharge Planning, Psychosocial Support, Patient/Family Education   Barriers to Discharge MD  Medical stability and Neurogenic bowel and bladder  Nursing      PT Home environment access/layout, Weight bearing restrictions NWB RUE, stairs to enter home however family working on installing ramp  OT Inaccessible home environment, Weight, Weight bearing restrictions, Home environment access/layout, Neurogenic Bowel & Bladder    SLP      SW       Team Discharge Planning: Destination: PT-Home ,OT- Home , SLP-  Projected Follow-up: PT-Home health PT, OT-  Home health OT, SLP-  Projected Equipment Needs: PT-To be determined, OT- 3 in 1 bedside comode, SLP-  Equipment Details: PT- , OT-  Patient/family involved in discharge planning: PT- Patient, Family member/caregiver,  OT-Patient, Family member/caregiver, SLP-   MD ELOS: 21-28 days Medical Rehab Prognosis:  Excellent Assessment: The patient has been admitted for CIR therapies with the diagnosis of C6 ASIA B SCI. The team will be addressing functional mobility, strength,  stamina, balance,  safety, adaptive techniques and equipment, self-care, bowel and bladder mgt, patient and caregiver education, family education, bowel and bladder program, pain control, orthotics, w/c assessment. Goals have been set at mod to max assist with self-care, max assist with transfers and mod assist with locomotion.    Ranelle OysterZachary T. Sharlize Hoar, MD, FAAPMR      See Team Conference Notes for weekly updates to the plan of care

## 2017-06-13 NOTE — Progress Notes (Signed)
Occupational Therapy Session Note  Patient Details  Name: Keith [name] MRN: [id number] Date of Birth: Dec 05, 1995  1500-1600 60 min  Short Term Goals: Week 1:  OT Short Term Goal 1 (Week 1): Pt will sit EOB/EOM for 5 min with MAX A for sitting balance during functional activity OT Short Term Goal 2 (Week 1): Pt will groom at sink with min A using AE PRN OT Short Term Goal 3 (Week 1): Pt will wash UB with MOD HOH A and AE PRN OT Short Term Goal 4 (Week 1): Pt will roll B in bed during ADLs with MAX A of 1 caregiver  Skilled Therapeutic Interventions/Progress Updates:  1:1. Pt educated on tenodesis grasp/release and picks up soft yellow foam block with LUE 15x with increased time and rest breaks. Pt discusses favorite pass time of playing videogame on tablet, but with difficulty pressing buttons d/t decreased finger isolation. Pt trials different U-cuffs (velcro v. Elastic) and red foam handle on stylus with best success/use of elastic U-cuff as it is tight enough and does not slide off. Educated pt and mother to check 15 min after wearing/playing with U-cuff to look for redness/marks on skin if cuff is too tight. OT positions iPad proped up on folded blanket with dycem on back to decrease slipping. Pt able to effectively play basketball video-game with set up after placing stylus to improve reaction time, reach, and functional use of RUE. Pt and mother educated on assistive technology mounting systems utilizing modular hose, X grips and clamps for proper positioning on w/c, table or bed. Exited session with pt seated in bed, call light in reach and all needs met.    Therapy Documentation Precautions:  Precautions Precautions: Fall, Cervical Precaution Comments: [name] - no ROM restrictions and WBAT 06/09/17;  [name] with cast  Required Braces or Orthoses: Cervical Brace Cervical Brace: Hard collar, Other (comment) Restrictions Weight Bearing Restrictions: Yes RUE  Weight Bearing: Non weight bearing LUE Weight Bearing: Weight bearing as tolerated Other Position/Activity Restrictions: pt with subluxation of Rt thumb early June   See Function Navigator for Current Functional Status.   Therapy/Group: Individual Therapy  [name] 06/13/2017, 4:06 PM

## 2017-06-13 NOTE — Progress Notes (Signed)
Occupational Therapy Session Note  Patient Details  Name: Keith SellCameron R XXXDillard MRN: [id number] Date of Birth: 07/30/1995  Today's Date: 06/13/2017 OT Individual Time: 1130-1200 OT Individual Time Calculation (min): 30 min    Short Term Goals: Week 1:  OT Short Term Goal 1 (Week 1): Pt will sit EOB/EOM for 5 min with MAX A for sitting balance during functional activity OT Short Term Goal 2 (Week 1): Pt will groom at sink with min A using AE PRN OT Short Term Goal 3 (Week 1): Pt will wash UB with MOD HOH A and AE PRN OT Short Term Goal 4 (Week 1): Pt will roll B in bed during ADLs with MAX A of 1 caregiver  Skilled Therapeutic Interventions/Progress Updates:  Balance/vestibular training;Discharge planning;Functional electrical stimulation;Pain management;Self Care/advanced ADL retraining;Therapeutic Activities;UE/LE Coordination activities;Disease mangement/prevention;Functional mobility training;Patient/family education;Skin care/wound managment;Therapeutic Exercise;Community reintegration;DME/adaptive equipment instruction;Neuromuscular re-education;Psychosocial support;Splinting/orthotics;UE/LE Strength taining/ROM;Wheelchair propulsion/positioning   1:1 focus on trial of wearing abdominal binder to A with BP. As session continued on pt reported not feeling well and like he was going to throw up.  BP was 107/53 with teds, ace and abdominal binder donned in upright position. Attempted self feeding with right hand with mod A with built up handle and with long straw. Pt not a fan of the hospital food- parents are bring in food. Discussed with pt and his parents about focus on self feeding in a session. Pt lifted back to bed due to continuing to not feel well.   Therapy Documentation Precautions:  Precautions Precautions: Fall, Cervical Precaution Comments: [name] - no ROM restrictions and WBAT 06/09/17;  [name] with cast  Required Braces or Orthoses: Cervical  Brace Cervical Brace: Hard collar, Other (comment) Restrictions Weight Bearing Restrictions: Yes RUE Weight Bearing: Non weight bearing LUE Weight Bearing: Weight bearing as tolerated Other Position/Activity Restrictions: pt with subluxation of Rt thumb early June  General: General Chart Reviewed: Yes Vital Signs: Therapy Vitals Temp: 100.2 F (37.9 C) Temp Source: Oral Pulse Rate: 85 Resp: 20 BP: (!) 109/46 Patient Position (if appropriate): Lying Oxygen Therapy SpO2: 100 % O2 Device: Not Delivered Pain: Pain Assessment Pain Assessment: 0-10 Pain Score: 8  Pain Location: Shoulder Pain Orientation: Left;Right Pain Descriptors / Indicators: Aching;Spasm ADL:   Vision Baseline Vision/History: No visual deficits Vision Assessment?: No apparent visual deficits Perception  Perception: Within Functional Limits Praxis Praxis: Intact Exercises:   Other Treatments:    See Function Navigator for Current Functional Status.   Therapy/Group: Individual Therapy  Roney MansSmith, [place] Marney [facility] 06/13/2017, 3:50 PM

## 2017-06-14 ENCOUNTER — Inpatient Hospital Stay (HOSPITAL_COMMUNITY): Payer: BLUE CROSS/BLUE SHIELD

## 2017-06-14 DIAGNOSIS — S14109D Unspecified injury at unspecified level of cervical spinal cord, subsequent encounter: Secondary | ICD-10-CM

## 2017-06-14 LAB — BASIC METABOLIC PANEL
ANION GAP: 9 (ref 5–15)
BUN: 16 mg/dL (ref 6–20)
CALCIUM: 9.1 mg/dL (ref 8.9–10.3)
CO2: 22 mmol/L (ref 22–32)
CREATININE: 0.67 mg/dL (ref 0.61–1.24)
Chloride: 101 mmol/L (ref 101–111)
GFR calc Af Amer: >60 mL/min (ref >60)
GLUCOSE: 100 mg/dL — AB (ref 65–99)
Potassium: 4.3 mmol/L (ref 3.5–5.1)
Sodium: 132 mmol/L — ABNORMAL LOW (ref 135–145)

## 2017-06-14 NOTE — Progress Notes (Signed)
Occupational Therapy Session Note  Patient Details  Name: Keith [name] MRN: [id number] Date of Birth: 27-Jul-1995  Today's Date: 06/14/2017 OT Individual Time: 9292-4462 and 1100-1209 OT Individual Time Calculation (min): 60 min and 69 min   Short Term Goals: Week 1:  OT Short Term Goal 1 (Week 1): Pt will sit EOB/EOM for 5 min with MAX A for sitting balance during functional activity OT Short Term Goal 2 (Week 1): Pt will groom at sink with min A using AE PRN OT Short Term Goal 3 (Week 1): Pt will wash UB with MOD HOH A and AE PRN OT Short Term Goal 4 (Week 1): Pt will roll B in bed during ADLs with MAX A of 1 caregiver  Skilled Therapeutic Interventions/Progress Updates:    1;1. Pt supine in bed with sister and girlfriend present assisting pt with dressing. Initiated CG education on body mechanics, rolling, positioning of bed and pt participating to decrease CG burden. Pt rolls MAX A +2 to don shorts over hips/place sling. OT dons teds and ace wraps. Pt trnasfers via maxi sky to w/c with total A. Ot propels TIS to gym. Pt vitals assessed throughout session as stated below with emphasis of session on tolerance to sitting upright. Pt sits at table with LUE on towel requiring MOD A for full ROM of toerl glide forward/back, horizontal, elbow flex/ext and circles in B directions 1x15 with prolonged rest breaks. Exited session with pt seated in w/c call light in reach and all needs met  94/47 first time upright in chair 101/46 semi upright at tabletop 117/59 tilted back  Session 2:   Pt sister/CG education initiated on pressure relief schedule, tilting mechanism, brake and team conferencing. Pt drowsy throughout session, however no c/o pain. Pt sits at table with RUE on towel requiring MOD A for full ROM of toerl glide forward/back, horizontal, elbow flex/ext and circles in B directions 1x15 with prolonged rest breaks. Pt BP monitored throuhgout session with vitals listed below. With coban  around dowel rod to facilitate grasp, pt completes bicep curl, internal/ext rotation, shoulder flexion ~20 degrees with supervision and VC for decreasing compensatory trunk movements. Pt completes supination/pronation and wrist extension against manual resistance to strengthen tenodesis grasp 1x15. Exited session with pt seated in w/c, sister present and all needs met.   103/58 tilted back 95/55 semi upright>98/63 after 5 min 92/56 tilted closer to upright (~20 degrees back)   Therapy Documentation Precautions:  Precautions Precautions: Fall, Cervical Precaution Comments: [name] - no ROM restrictions and WBAT 06/09/17;  [name] with cast  Required Braces or Orthoses: Cervical Brace Cervical Brace: Hard collar, Other (comment) Restrictions Weight Bearing Restrictions: Yes RUE Weight Bearing: Non weight bearing LUE Weight Bearing: Weight bearing as tolerated Other Position/Activity Restrictions: pt with subluxation of Rt thumb early June  General:   Vital Signs:   Pain: Pain Assessment Pain Score: Asleep  See Function Navigator for Current Functional Status.   Therapy/Group: Individual Therapy  [name] 06/14/2017, 12:10 PM

## 2017-06-14 NOTE — Progress Notes (Signed)
Physical Therapy Note  Patient Details  Name: Keith [name] MRN: [id number] Date of Birth: 11/04/1995 Today's Date: 06/14/2017  1420-1535, 75 min individual tx Pain: no pain per pt; nauseated  Pt up in tilt in space w/c.  PTs donned abdominal binder with pt in sitting.  Slide board transfer to L +2.  PT educated pt on head/hips relationship and sequencing of set-up for transfer.    Pt tolerated sitting x 1 min before c/o nausea.  Pt laid in upine x 10 min. See vitals +2 supine> sit. Pt tolerated 1 min supported sitting.  See vitals.  L lateral leans, minimal excursion, x 5 x 2 with max assis+2, to pillow over wedge. Slide board transfer back to bed.  Pt voided and demonstrated retention of head/hips relationship by end of session.  Pt left resting in bed with alarm set and all needs at hand; famly present.   See function navigator for current status.  Reola Buckles 06/14/2017, 12:51 PM
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
## 2017-06-15 NOTE — Progress Notes (Signed)
Patient refuses his bowel program, this RN educated him but both patient and family still refused med. We continue to monitor.

## 2017-06-16 ENCOUNTER — Inpatient Hospital Stay (HOSPITAL_COMMUNITY): Payer: BLUE CROSS/BLUE SHIELD | Admitting: Physical Therapy

## 2017-06-16 ENCOUNTER — Inpatient Hospital Stay (HOSPITAL_COMMUNITY): Payer: BLUE CROSS/BLUE SHIELD

## 2017-06-16 MED ORDER — BACLOFEN 5 MG HALF TABLET
5.0000 mg | ORAL_TABLET | Freq: Three times a day (TID) | ORAL | Status: DC
Start: 2017-06-16 — End: 2017-06-17
  Administered 2017-06-16 – 2017-06-17 (×3): 5 mg via ORAL
  Filled 2017-06-16 (×4): qty 1

## 2017-06-16 MED ORDER — SORBITOL 70 % SOLN
30.0000 mL | Freq: Once | Status: AC
  Administered 2017-06-16: 30 mL via ORAL
  Filled 2017-06-16: qty 30

## 2017-06-16 NOTE — Plan of Care (Signed)
Patient continue to refuse his bowel/program. LBM 06/13/17

## 2017-06-16 NOTE — Care Management (Signed)
[facility] Individual Statement of Services  Patient Name:  Keith [name]  Date:  06/16/2017  Welcome to the [facility]patient [facility].  Our goal is to provide you with an individualized program based on your diagnosis and situation, designed to meet your specific needs.  With this comprehensive rehabilitation program, you will be expected to participate in at least 3 hours of rehabilitation therapies Monday-Friday, with modified therapy programming on the weekends.  Your rehabilitation program will include the following services:  Physical Therapy (PT), Occupational Therapy (OT), 24 hour per day rehabilitation nursing, Therapeutic Recreaction (TR), Neuropsychology, Case Management (Social Worker), Rehabilitation Medicine, Nutrition Services and Pharmacy Services  Weekly team conferences will be held on Tuesdays to discuss your progress.  Your Social Worker will talk with you frequently to get your input and to update you on team discussions.  Team conferences with you and your family in attendance may also be held.  Expected length of stay: 2-3 weeks    Overall anticipated outcome: moderate to maximum assistance  Depending on your progress and recovery, your program may change. Your Social Worker will coordinate services and will keep you informed of any changes. Your Social Worker's name and contact numbers are listed  below.  The following services may also be recommended but are not provided by the Inpatient Rehabilitation Center:   Driving Evaluations  Home Health Rehabiltiation Services  Outpatient Rehabilitation Services  Vocational Rehabilitation   Arrangements will be made to provide these services after discharge if needed.  Arrangements include referral to agencies that provide these services.  Your insurance has been verified to be:  [employer] [place]. Your primary doctor is:  Panosh  Pertinent information will be shared with your doctor and your  insurance company.  Social Worker:  AshlandLucy Shawnese Magner, [place] [phone] or ([phone]) [phone]   Information discussed with and copy given to patient by: Amada JupiterHOYLE, Kyrell Ruacho, 06/16/2017, 1:43 PM

## 2017-06-16 NOTE — Progress Notes (Signed)
Occupational Therapy Session Note  Patient Details  Name: Keith [name] MRN: [id number] Date of Birth: 07/17/1995  Today's Date: 06/16/2017 OT Individual Time: 1100-1157 OT Individual Time Calculation (min): 57 min    Short Term Goals: Week 1:  OT Short Term Goal 1 (Week 1): Pt will sit EOB/EOM for 5 min with MAX A for sitting balance during functional activity OT Short Term Goal 2 (Week 1): Pt will groom at sink with min A using AE PRN OT Short Term Goal 3 (Week 1): Pt will wash UB with MOD HOH A and AE PRN OT Short Term Goal 4 (Week 1): Pt will roll B in bed during ADLs with MAX A of 1 caregiver  Skilled Therapeutic Interventions/Progress Updates:    1:1. Pt received with direct handoff from PT. Pt in supine and requires TOTAL A +2 for supine>sit EOM. Pt reports burning shooting pain in B arms after assuming upright position 10/10, but subsides after 2 min seated and BUE supported at elbows. Pt slide board transfer total +2 EOM>w/c. Sitting in TIS at high low table, pt picks up yellow foam block with tenodesis grasp and places into cup with MOD A for reaching to target 2x15 with rest breaks after every 5 reps. Pt completes arm skate with BUE against manual resistance applied by OT shoulder pro/retraction, horizontal ab/adduct, elbow felx/ext and B circles 1x15. Greater RUE tricep activation noted today. Exited session with pt seated in TIS, with mom present setting up for meal. Pt BP 118/60 fully upright and no c/o dizziness throughout session.   Therapy Documentation Precautions:  Precautions Precautions: Fall, Cervical Precaution Comments: [name] - no ROM restrictions and WBAT 06/09/17;  [name] with cast  Required Braces or Orthoses: Cervical Brace Cervical Brace: Hard collar, Other (comment) Restrictions Weight Bearing Restrictions: Yes RUE Weight Bearing: Non weight bearing LUE Weight Bearing: Weight bearing as tolerated Other Position/Activity  Restrictions: pt with subluxation of Rt thumb early June   See Function Navigator for Current Functional Status.   Therapy/Group: Individual Therapy  Shon HaleStephanie M Clarice Zulauf 06/16/2017, 12:06 PM
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
## 2017-06-16 NOTE — Progress Notes (Signed)
Physical Therapy Session Note  Patient Details  Name: Keith [name] MRN: [id number] Date of Birth: 1996-05-13  Today's Date: 06/16/2017 PT Individual Time: 1000-1100    1415-1540 PT Individual Time Calculation (min): 60 min +41mins=  Short Term Goals: Week 1:  PT Short Term Goal 1 (Week 1): Pt will roll R/L with maxA +1  PT Short Term Goal 2 (Week 1): Pt will transition supine to sit with max A +2 PT Short Term Goal 3 (Week 1): Pt will transfer bed<>TIS with max A +2  PT Short Term Goal 4 (Week 1): Pt will maintain static sitting balance with min A +1 for 5 minutes with no change in vitals   Skilled Therapeutic Interventions/Progress Updates:    Tx 1: Pt greeted supine in bed, mom present in room, agreeable to therapy session. Therapist dependently donned ted hose, ACE wraps, L wrist splint in supine, pt initiates roll R/L to don brief, totalA to complete roll. Pt transitions R side-lying to sit with total A, vcing to push up with LUE on bed, cervical collar donned dependently in sitting, blood pressure 94/72 in sitting, transfers bed<>WC<>mat using sliding board with total A +2, vcing to maintain anterior lean and head-hips relationship, BP 112/76 reclined in TIS. Sitting on mat, emphasized sitting balance utilizing visual cues in mirror, practiced anterior/posterior lean using head to initiate movement, transitions sit<>supine total A +2. BLE PROM assessed, RLE grossly WFL, LLE HS tightness noted with passive extension lacking approximately 20degrees compared to R, L ankle DF lacking approximately 10 degrees from neutral, pt reports mom is still performing passive stretching. LE measurements made for leg loops, at end of session, pt handed off to OT.    Tx 2: Pt was greeted sitting upright in TIS, reports has been tolerating OOB since morning therapy sessions, agreeable to therapy, dad and aunt present throughout most of session. Pt transported to therapy gym, transfers WC<>mat using  sliding board, total A +2, vcing fro head hips relationship. Transitions sit<>supine with totalA +2, performs 5 x rolling R/L emphasizing arm swing for momentum, wedge placed under side to decrease range, requires S/minA for crossing LE, guiding UE, and assisting hips to complete roll. Demonstrates increased difficulty rolling L sidleying to supine, improved ability with cuff weight placed over L wrist and with repetitions. Total A +2 to transition supine to long-sitting, provided education on benefits of long sitting for HS flexibility, and functional use of position for dressing etc. Pt c/o spasms in R side of neck, increased muscle tightness noted in UT and scalenes, 1st rib elevated with palpation, pt reports has numbness and tingling in R hand PTA, pt positioned supine on wedge, STM provided to R scalenes and UT with some relief. Reviewed lower extremity range of motion stretches 1 x 60sec hold each and provided handout, discussed with father.  Pt transitions to sitting and transfers to TIS as described above. At end of session, pt returned to room in TIS, dad present, timer for out of bed chart started, denies any needs at this time.  Therapy Documentation Precautions:  Precautions Precautions: Fall, Cervical Precaution Comments: [name] - no ROM restrictions and WBAT 06/09/17;  [name] with cast  Required Braces or Orthoses: Cervical Brace Cervical Brace: Hard collar, Other (comment) Restrictions Weight Bearing Restrictions: Yes RUE Weight Bearing: Non weight bearing LUE Weight Bearing: Weight bearing as tolerated Other Position/Activity Restrictions: pt with subluxation of Rt thumb early June  Pain: Pain Assessment Pain Assessment: 0-10  Pain Score: 2  Pain Type: Acute pain Pain Location: Neck Pain Orientation: Right;Left Pain Radiating Towards: shoulder Pain Descriptors / Indicators: Aching Pain Frequency: Intermittent Pain Onset: On-going Pain Intervention(s):  Medication (See eMAR)(gvn prior to therapy)   See Function Navigator for Current Functional Status.   Therapy/Group: Individual Therapy  Theodosia QuayMorgan Chiquita Heckert 06/16/2017, 12:35 PM

## 2017-06-17 ENCOUNTER — Inpatient Hospital Stay (HOSPITAL_COMMUNITY): Payer: BLUE CROSS/BLUE SHIELD | Admitting: Physical Therapy

## 2017-06-17 ENCOUNTER — Encounter (HOSPITAL_COMMUNITY): Payer: BLUE CROSS/BLUE SHIELD | Admitting: Psychology

## 2017-06-17 ENCOUNTER — Inpatient Hospital Stay (HOSPITAL_COMMUNITY): Payer: BLUE CROSS/BLUE SHIELD

## 2017-06-17 ENCOUNTER — Inpatient Hospital Stay (HOSPITAL_COMMUNITY): Payer: BLUE CROSS/BLUE SHIELD | Admitting: Occupational Therapy

## 2017-06-17 MED ORDER — INFLUENZA VAC SPLIT QUAD 0.5 ML IM SUSY: 0.5000 mL | PREFILLED_SYRINGE | INTRAMUSCULAR | Status: DC

## 2017-06-17 MED ORDER — BACLOFEN 10 MG PO TABS
10.0000 mg | ORAL_TABLET | Freq: Three times a day (TID) | ORAL | Status: DC
  Filled 2017-06-17 (×3): qty 1
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
## 2017-06-17 NOTE — Progress Notes (Signed)
Occupational Therapy Session Note  Patient Details  Name: Keith [name] MRN: [id number] Date of Birth: 12/11/1995  Skilled Therapeutic Interventions/Progress Updates: upon initial approach, barber was here cutting patient's hair.  patient and mother seen for patient and family education.   Then neuro psychologist came into treat patient.   So, clinician was able to return later to finish working with patient.   Focus was on UE PROM to work within his pain tolerance, for especially bilateral shoulder ABD past 90 degrees and ADD past 45 degrees  Patient left with his mom at end of session finishing his lunch before his next therapy session   Therapy Documentation Precautions:  Precautions Precautions: Fall, Cervical Precaution Comments: [name] - no ROM restrictions and WBAT 06/09/17;  [name] with cast  Required Braces or Orthoses: Cervical Brace Cervical Brace: Hard collar, Other (comment) Restrictions Weight Bearing Restrictions: Yes RUE Weight Bearing: Non weight bearing LUE Weight Bearing: Weight bearing as tolerated Other Position/Activity Restrictions: pt with subluxation of Rt thumb early June   Pain:denied except with some ROM - subsided after ROM finished   Therapy/Group: Individual Therapy  Rozelle Loganickett, Karsyn Jamie Yeary 06/17/2017, 2:42 PM

## 2017-06-17 NOTE — Progress Notes (Signed)
Patient declining the flu shot.

## 2017-06-17 NOTE — Progress Notes (Signed)
Physical Therapy Session Note  Patient Details  Name: Keith [name] MRN: [id number] Date of Birth: 12/19/1995  Today's Date: 06/17/2017 PT Individual Time: 1100-1155 1430-1530  PT Individual Time Calculation (min): 55 min + 60mins  115mins total   Short Term Goals: Week 1:  PT Short Term Goal 1 (Week 1): Pt will roll R/L with maxA +1  PT Short Term Goal 2 (Week 1): Pt will transition supine to sit with max A +2 PT Short Term Goal 3 (Week 1): Pt will transfer bed<>TIS with max A +2  PT Short Term Goal 4 (Week 1): Pt will maintain static sitting balance with min A +1 for 5 minutes with no change in vitals   Skilled Therapeutic Interventions/Progress Updates:    Tx 1: Pt greeted supine in bed, mom present, L wrist splint and cervical collar donned totalA in supine. Pt transitions supine to R sidelying with maxA +2, R sidelying to sit with maxA +2. Transfers bed<>TIS<> mat using slideboard totalA +2, vcing to maintain anterior lean and head hips relationship. MaxA +1 to maintain static sitting balance on edge of mat, reports feeling very dizzy and requests to lie back, does not improve in reclined position so placed supine with legs elevated. Pt reports heavyness sensation in arms, stating he "doesn't feel right today" reports catheter removed at 5am, no I/o catheter since, BP 142/91 in supine, therapist suspects autonomic dysreflexia, pt transitioned back to sitting with totalA. [facility], taken back to room for RN to perform bladder scan, pt reports some feeling of urgency, attempts to use urinal with TotalA by RN to hold, requests to try lying in bed, transfers as above. At end of session, pt in supine in bed with RN and mom present, denies any needs at this time.   Tx 2: pt greeted reclined in TIS in room, agreeable to therapy, mom present in room but not throughout session. Pt dependently transported to therapy gym, transfers WC<>mat using sliding board TotalA +2. Pt tolerates  supported sitting upright on edge of mat for 20 mins, emphasis on pt weightshifting to find center of balance in static sitting, TotalA to maintain sitting balance during dynamic activity of hitting beach ball with alternating UE, pt weight shifts but overshoots and totalA to recover balance. Attempted to have pt support UE on elbows however unable to maintain position, performs 3 x trunk extensions from position using BUE to push self back. Pt transitions sit to supine with totalA, rolls R//L with modA at hips and to guide UE, improved ability to roll sidelying to supine with intermittent vcing for technique. Demonstrated use of leg loops for future use, at end of session, nephew pushed pt back to room in TIS with supervision, pt left reclined in TIS, mom, sister, and nephew present, denies any needs at this time.   Therapy Documentation Precautions:  Precautions Precautions: Fall, Cervical Precaution Comments: [name] - no ROM restrictions and WBAT 06/09/17;  [name] with cast  Required Braces or Orthoses: Cervical Brace Cervical Brace: Hard collar, Other (comment) Restrictions Weight Bearing Restrictions: Yes RUE Weight Bearing: Non weight bearing LUE Weight Bearing: Weight bearing as tolerated Other Position/Activity Restrictions: pt with subluxation of Rt thumb early June     Pain: Pain Assessment Pain Score: 2    See Function Navigator for Current Functional Status.   Therapy/Group: Individual Therapy  Theodosia QuayMorgan Derrica Sieg 06/17/2017, 12:45 PM

## 2017-06-17 NOTE — Progress Notes (Signed)
Occupational Therapy Session Note  Patient Details  Name: Keith [name] MRN: [id number] Date of Birth: 03/02/1996  Today's Date: 06/17/2017 OT Individual Time: 0800-0900 OT Individual Time Calculation (min): 60 min    Short Term Goals: Week 1:  OT Short Term Goal 1 (Week 1): Pt will sit EOB/EOM for 5 min with MAX A for sitting balance during functional activity OT Short Term Goal 2 (Week 1): Pt will groom at sink with min A using AE PRN OT Short Term Goal 3 (Week 1): Pt will wash UB with MOD HOH A and AE PRN OT Short Term Goal 4 (Week 1): Pt will roll B in bed during ADLs with MAX A of 1 caregiver  Skilled Therapeutic Interventions/Progress Updates:    1:1. Pt focus of session on family education for LB dressing, rolling B and education on bowel management. Pt with incontinent bowel movement in brief and mother assisting to cleans peri area. OT educates on body mechanics, hospital bed functions, and rolling technique. Mother would benefit from continued education to decrease burden of care. OT dons teds, ace wraps and shorts. Pt rolls B throughout session with MAX A of 2 and VC for hand placement on bed rails to hook and A in rolling. Pt declines OOB d/t having prolonged break until next therapy. Pt's mother unhappy with how sacral sore on bottom looks, and reiterated education on tilting schedule for pressure relief as well as turning at night. Left ROM handout for mother on UE. Will complete hands on education at later date d/t time constraints. Exited session with pt seated in bed, call light in reahc and all needs met  Therapy Documentation Precautions:  Precautions Precautions: Fall, Cervical Precaution Comments: [name] - no ROM restrictions and WBAT 06/09/17;  [name] with cast  Required Braces or Orthoses: Cervical Brace Cervical Brace: Hard collar, Other (comment) Restrictions Weight Bearing Restrictions: Yes RUE Weight Bearing: Non weight bearing LUE  Weight Bearing: Weight bearing as tolerated Other Position/Activity Restrictions: pt with subluxation of Rt thumb early June  General:    See Function Navigator for Current Functional Status.   Therapy/Group: Individual Therapy  [name] 06/17/2017, 8:58 AM

## 2017-06-17 NOTE — Progress Notes (Signed)
Foley removed at 6 am per order. Procedure tolerated, no adverse effect noted. We continue to monitor
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
## 2017-06-17 NOTE — Progress Notes (Signed)
Pt was bathed and had very large incontinent stool this evening. Pt complaining of spasms and saying that baclofen is making him feel sick. Pt requested baclofen be changed back to flexiril. Pt given prn dose of flexiril. Will continue to monitor.

## 2017-06-17 NOTE — Progress Notes (Signed)
Patient continue to refused his bowel program. He also refuses his 7 am senna. He said he had BM yesterday. This RN educated pt. And his mom but they still refused med.

## 2017-06-18 ENCOUNTER — Inpatient Hospital Stay (HOSPITAL_COMMUNITY): Payer: BLUE CROSS/BLUE SHIELD | Admitting: Physical Therapy

## 2017-06-18 ENCOUNTER — Inpatient Hospital Stay (HOSPITAL_COMMUNITY): Payer: BLUE CROSS/BLUE SHIELD | Admitting: *Deleted

## 2017-06-18 MED ORDER — BACLOFEN 5 MG HALF TABLET
5.0000 mg | ORAL_TABLET | Freq: Three times a day (TID) | ORAL | Status: DC
  Administered 2017-06-18 – 2017-06-29 (×33): 5 mg via ORAL
  Filled 2017-06-18 (×34): qty 1

## 2017-06-18 MED ORDER — ENOXAPARIN SODIUM 60 MG/0.6ML ~~LOC~~ SOLN
60.0000 mg | SUBCUTANEOUS | Status: DC
  Administered 2017-06-18 – 2017-07-11 (×24): 60 mg via SUBCUTANEOUS
  Filled 2017-06-18 (×24): qty 0.6
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
## 2017-06-18 NOTE — Progress Notes (Addendum)
Physical Therapy Session Note  Patient Details  Name: Keith [name] MRN: [id number] Date of Birth: [birth year]  Today's Date: 06/18/2017 PT Individual Time: 0900-1000 and 1415-1530 PT Individual Time Calculation (min): 60 min and 75 min (total 135 min)   Short Term Goals: Week 1:  PT Short Term Goal 1 (Week 1): Pt will roll R/L with maxA +1  PT Short Term Goal 2 (Week 1): Pt will transition supine to sit with max A +2 PT Short Term Goal 3 (Week 1): Pt will transfer bed<>TIS with max A +2  PT Short Term Goal 4 (Week 1): Pt will maintain static sitting balance with min A +1 for 5 minutes with no change in vitals   Skilled Therapeutic Interventions/Progress Updates: Tx 1: Pt received supine in bed, denies pain and agreeable to treatment. Rolling to L maxA using RUE hooked on therapist's arm and assist for placing RLE into hooklying to facilitate roll. Sidelying >sit with totalA +2. Transfer bed>w/c >mat>w/c throughout session totalA +2; utilized question cueing to allow pt to direct care for w/c setup and body positioning. Sitting balance on edge of mat variable min guard x2-3 sec at a time>totalA with LOBs in all directions. Propped on elbow, pt able to demo min guard static balance on R elbow while transfer board being placed/removed, unable to maintain on L elbow d/t shoulder strength/stability deficits. Leg loops donned in sitting and demo'ed use to pt; pt able to move LE minimally in sitting d/t weight of leg and absent core strength. Educated on use of leg loops for pt to improve in strength and functional use as well as making LEs easier to move for caregiver. Sit >supine with logroll technique, totalA +2. Attempted to pull LEs into hookyling using leg loops; unable to complete independently. Supine>long sit totalA +2. Utilized leg loops to assist with moving LEs off edge of mat. Transferred to reclining standard w/c with totalA +2. W/c propulsion x15' with maxA; only able to utilize LUE d/t R  wrist cast; difficulty maintaining hand on w/c rim. Would benefit from dycem gloves or theraband on wheel. Demo'ed to pt's father how to boost w/c for pressure relief and in the event of orthostatic hypotension. Discussed plan to keep tilt in space chair as needed until able to tolerate standard chair full time. Remained seated in w/c, all needs in reach.   Tx 2: Pt received supine in bed, denies pain and agreeable to treatment. Supine>sit with leg loops, long sitting with HOB elevated and AAROM for lifting LEs off side of bed. Transfer bed<>w/c and w/c <>mat table with maxA +2; improved initiation with repositioning trunk, utilizing UEs pulling on therapist's UEs and on arm rests. Pt also assisting with LE management using leg loops. Upon arrival to gym and transferred to mat, pt noted to have had incontinent bowel movement. Returned to w/c and back to bed as above. Rolling R/L maxA +2 to A with changing brief and performing hygiene totalA. Pt's mother assisted with performing hygiene and clothing management. Performed BLE PROM hamstrings, hip ER, gastroc x1 min each. Remained supine in bed at end of session, all needs in reach and family present.      Therapy Documentation Precautions:  Precautions Precautions: Fall, Cervical Precaution Comments: [name] - no ROM restrictions and WBAT 06/09/17;  [name] with cast  Required Braces or Orthoses: Cervical Brace Cervical Brace: Hard collar, Other (comment) Restrictions Weight Bearing Restrictions: Yes RUE Weight Bearing: Non weight bearing LUE Weight  Bearing: Weight bearing as tolerated Other Position/Activity Restrictions: pt with subluxation of Rt thumb early June  Pain: Pain Assessment Pain Assessment: No/denies pain   See Function Navigator for Current Functional Status.   Therapy/Group: Individual Therapy  Keith [name] 06/18/2017, 12:10 PM

## 2017-06-18 NOTE — Progress Notes (Signed)
Social Work Patient ID: Keith [name], male   DOB: December 13, 1995, 22 y.o.   MRN: [id number]   Met with pt and parents this afternoon to review team conference.  All aware and agreeable with targeted d/c date of 3/1 and max assist w/c levels overall.  Pt and parents pleased with progress so far and parents continue to be very involved.  No concerns or questions at this time.    Lylie Blacklock, LCSW

## 2017-06-18 NOTE — Progress Notes (Signed)
Occupational Therapy Session Note  Patient Details  Name: Keith [name] MRN: [id number] Date of Birth: 08/19/1995  Today's Date: 06/18/2017 OT Individual Time: 0700-0800 OT Individual Time Calculation (min): 60 min    Short Term Goals: Week 1:  OT Short Term Goal 1 (Week 1): Pt will sit EOB/EOM for 5 min with MAX A for sitting balance during functional activity OT Short Term Goal 2 (Week 1): Pt will groom at sink with min A using AE PRN OT Short Term Goal 3 (Week 1): Pt will wash UB with MOD HOH A and AE PRN OT Short Term Goal 4 (Week 1): Pt will roll B in bed during ADLs with MAX A of 1 caregiver  Skilled Therapeutic Interventions/Progress Updates:    Pt resting in bed upon arrival and agreeable to participating in therapy. OT intervention with focus on bed mobility, BUE use for self feeding and UB dressing tasks, and sitting balance with HOB elevated. Pt recalled UB dressing techniques but required assistance to doff/don pullover shirt.  Pt engaged in self feeding with foam on utensils.  Pt able to bring food to mouth but required assistance scooping/stabbing food items.  Pt requires tot A+2 for rolling in bed to pull pants over hips.  Pt remained in bed with all needs within reach.    Therapy Documentation Precautions:  Precautions Precautions: Fall, Cervical Precaution Comments: [name] - no ROM restrictions and WBAT 06/09/17;  [name] with cast  Required Braces or Orthoses: Cervical Brace Cervical Brace: Hard collar, Other (comment) Restrictions Weight Bearing Restrictions: Yes RUE Weight Bearing: Non weight bearing LUE Weight Bearing: Weight bearing as tolerated Other Position/Activity Restrictions: pt with subluxation of Rt thumb early June   Pain:  Pt initially c/o HA but resolved when repositioned  See Function Navigator for Current Functional Status.   Therapy/Group: Individual Therapy  [name], [name] 06/18/2017, 8:34 AM

## 2017-06-19 ENCOUNTER — Inpatient Hospital Stay (HOSPITAL_COMMUNITY): Payer: BLUE CROSS/BLUE SHIELD | Admitting: Physical Therapy

## 2017-06-19 ENCOUNTER — Inpatient Hospital Stay (HOSPITAL_COMMUNITY): Payer: BLUE CROSS/BLUE SHIELD

## 2017-06-19 ENCOUNTER — Inpatient Hospital Stay (HOSPITAL_COMMUNITY): Payer: BLUE CROSS/BLUE SHIELD | Admitting: Occupational Therapy

## 2017-06-19 NOTE — Progress Notes (Signed)
Occupational Therapy Session Note  Patient Details  Name: Keith SellCameron R XXXDillard MRN: [id number] Date of Birth: 03/04/1996  Today's Date: 06/19/2017 OT Individual Time: [id number] OT Individual Time Calculation (min): 29 min    Short Term Goals: Week 1:  OT Short Term Goal 1 (Week 1): Pt will sit EOB/EOM for 5 min with MAX A for sitting balance during functional activity OT Short Term Goal 2 (Week 1): Pt will groom at sink with min A using AE PRN OT Short Term Goal 3 (Week 1): Pt will wash UB with MOD HOH A and AE PRN OT Short Term Goal 4 (Week 1): Pt will roll B in bed during ADLs with MAX A of 1 caregiver  Skilled Therapeutic Interventions/Progress Updates:    Pt completed BUE AROM exercises from supine position during session.  He completed 2 sets of 10 repetitions for shoulder flexion AAROM, with elbow flexed to 90 degrees.  One set of AROM elbow flexion for 10 repetitions was completed next.  Next, had him complete shoulder extension with each UE positioned on a pillow just below shoulder level.  Mod assist to complete the movements.  Finished with AAROM digit flexion and extension with max assist.  Pt left in bed in preparation for nursing care and upcoming PT session.     Therapy Documentation Precautions:  Precautions Precautions: Fall, Cervical Precaution Comments: [name] - no ROM restrictions and WBAT 06/09/17;  [name] with cast  Required Braces or Orthoses: Cervical Brace Cervical Brace: Hard collar, Other (comment) Restrictions Weight Bearing Restrictions: Yes RUE Weight Bearing: Non weight bearing LUE Weight Bearing: Weight bearing as tolerated Other Position/Activity Restrictions: pt with subluxation of Rt thumb early June   Pain: Pain Assessment Pain Assessment: No/denies pain ADL: See Function Navigator for Current Functional Status.   Therapy/Group: Individual Therapy  Yerlin Gasparyan OTR/L 06/19/2017, 3:47 PM
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
## 2017-06-19 NOTE — Progress Notes (Signed)
Patient refusing to allow SCD's or boots to be put on this shift. Note left for MD.

## 2017-06-19 NOTE — Progress Notes (Signed)
Occupational Therapy Session Note  Patient Details  Name: Keith SellCameron R XXXDillard MRN: [id number] Date of Birth: 12/13/1995  Today's Date: 06/19/2017 OT Individual Time: 0700-0800 OT Individual Time Calculation (min): 60 min    Short Term Goals: Week 1:  OT Short Term Goal 1 (Week 1): Pt will sit EOB/EOM for 5 min with MAX A for sitting balance during functional activity OT Short Term Goal 2 (Week 1): Pt will groom at sink with min A using AE PRN OT Short Term Goal 3 (Week 1): Pt will wash UB with MOD HOH A and AE PRN OT Short Term Goal 4 (Week 1): Pt will roll B in bed during ADLs with MAX A of 1 caregiver  Skilled Therapeutic Interventions/Progress Updates:    Pt asleep in bed upon arrival but easily aroused.  Mom present and stated pt was restless throughout night.  OT intervention with focus on bed mobility, sitting balance in bed, LB dressing, and self feeding.  Pt incontinent of bowel and Mom assisted with hygiene.  Shorts, Teds, and Ace wraps applied with pt sitting with min A in bed in long sitting.  Pt able to flex at hips to bed forward and reach towards feet with min A for support.  Pt able to bring spoon to mouth without assistance after food scooped on spoon.  Pt exhibits increased control.  Pt remained in bed with Mom present assisting with feeding.   Therapy Documentation Precautions:  Precautions Precautions: Fall, Cervical Precaution Comments: [name] - no ROM restrictions and WBAT 06/09/17;  [name] with cast  Required Braces or Orthoses: Cervical Brace Cervical Brace: Hard collar, Other (comment) Restrictions Weight Bearing Restrictions: Yes RUE Weight Bearing: Non weight bearing LUE Weight Bearing: Weight bearing as tolerated Other Position/Activity Restrictions: pt with subluxation of Rt thumb early June    Pain: Pain Assessment Pain Assessment: 0-10 Pain Score: 0-No pain  See Function Navigator for Current Functional Status.   Therapy/Group:  Individual Therapy  [name], Chellsea Beckers Chappell 06/19/2017, 11:24 AM
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
## 2017-06-20 ENCOUNTER — Inpatient Hospital Stay (HOSPITAL_COMMUNITY): Payer: BLUE CROSS/BLUE SHIELD

## 2017-06-20 ENCOUNTER — Inpatient Hospital Stay (HOSPITAL_COMMUNITY): Payer: BLUE CROSS/BLUE SHIELD | Admitting: Physical Therapy

## 2017-06-20 ENCOUNTER — Inpatient Hospital Stay (HOSPITAL_COMMUNITY): Payer: BLUE CROSS/BLUE SHIELD | Admitting: *Deleted

## 2017-06-20 MED ORDER — NORTRIPTYLINE HCL 10 MG PO CAPS
10.0000 mg | ORAL_CAPSULE | Freq: Every day | ORAL | Status: DC
  Administered 2017-06-20 – 2017-06-28 (×9): 10 mg via ORAL
  Filled 2017-06-20 (×21): qty 1

## 2017-06-20 MED ORDER — SENNOSIDES-DOCUSATE SODIUM 8.6-50 MG PO TABS
1.0000 | ORAL_TABLET | Freq: Every day | ORAL | Status: DC
  Administered 2017-06-21 – 2017-06-30 (×4): 1 via ORAL
  Filled 2017-06-20 (×7): qty 1
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
## 2017-06-20 NOTE — Progress Notes (Signed)
Occupational Therapy Note  Patient Details  Name: Keith [name] MRN: [id number] Date of Birth: 06/14/1995  Today's Date: 06/20/2017 OT Individual Time: [id number] OT Individual Time Calculation (min): 43 min   Pt denies pain Individual Therapy  Pt resting in bed upon arrival.  Pt completed BUE AROM exercises from supine position with HOB elevated at 26 degreesduring session.  He completed 2 sets of 10 repetitions for shoulder flexion AAROM, with elbow flexed to 90 degrees.  One set of AROM elbow flexion for 10 repetitions was completed next.  Next, had him complete shoulder extension with each UE positioned on a pillow just below shoulder level.  Mod assist to complete the movements. Pt remained in bed with all needs within reach.     Keith [name], Keith [name] Lake'S Crossing CenterChappell 06/20/2017, 2:29 PM
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
## 2017-06-20 NOTE — Progress Notes (Signed)
Occupational Therapy Weekly Progress Note  Patient Details  Name: Keith [name] MRN: [id number] Date of Birth: [birth year]  Beginning of progress report period: June 13, 2017 End of progress report period: June 20, 2016  Patient has met 0 of 4 short term goals.  Although pt did not meet any STG, he continues to exhibit improvement with BUE movement and is using his LUE for self feeding with min A.  Pt used tenodesis with L hand to grasp utensils with foam buildup. Pt requires tot A + 2 for rolling in bed.  Focus has been on LB and UB dressing tasks.  Pt recalled and initiated UB dressing techniques but requires assistance to complete tasks.  Mom has been present and has assisted with care. Patient continues to demonstrate the following deficits: muscle weakness and muscle paralysis, decreased cardiorespiratoy endurance, impaired timing and sequencing, abnormal tone, unbalanced muscle activation and decreased coordination and decreased sitting balance, decreased postural control and decreased balance strategies and therefore will continue to benefit from skilled OT intervention to enhance overall performance with BADL.  Patient progressing toward long term goals..  Continue plan of care.  OT Short Term Goals Week 1:  OT Short Term Goal 1 (Week 1): Pt will sit EOB/EOM for 5 min with MAX A for sitting balance during functional activity OT Short Term Goal 1 - Progress (Week 1): Progressing toward goal OT Short Term Goal 2 (Week 1): Pt will groom at sink with min A using AE PRN OT Short Term Goal 2 - Progress (Week 1): Progressing toward goal OT Short Term Goal 3 (Week 1): Pt will wash UB with MOD HOH A and AE PRN OT Short Term Goal 3 - Progress (Week 1): Progressing toward goal OT Short Term Goal 4 (Week 1): Pt will roll B in bed during ADLs with MAX A of 1 caregiver OT Short Term Goal 4 - Progress (Week 1): Progressing toward goal Week 2:  OT Short Term Goal 1 (Week 2): Pt will sit  EOB/EOM for 5 min with MAX A for sitting balance during functional activity OT Short Term Goal 2 (Week 2): Pt will groom at sink with min A using AE PRN OT Short Term Goal 3 (Week 2): Pt will wash UB with MOD HOH A and AE PRN OT Short Term Goal 4 (Week 2): Pt will roll B in bed during ADLs with MAX A of 1 caregiver   Therapy Documentation Precautions:  Precautions Precautions: Fall, Cervical Precaution Comments: [name] - no ROM restrictions and WBAT 06/09/17;  [name] with cast  Required Braces or Orthoses: Cervical Brace Cervical Brace: Hard collar, Other (comment) Restrictions Weight Bearing Restrictions: Yes RUE Weight Bearing: Non weight bearing LUE Weight Bearing: Weight bearing as tolerated Other Position/Activity Restrictions: pt with subluxation of Rt thumb early June      See Function Navigator for Current Functional Status.    Leotis Shames [facility] 06/20/2017, 7:02 AM

## 2017-06-20 NOTE — Progress Notes (Signed)
Occupational Therapy Session Note  Patient Details  Name: Keith [name] MRN: [id number] Date of Birth: Sep 25, 1995  Today's Date: 06/20/2017 OT Individual Time: 1500-1526 OT Individual Time Calculation (min): 26 min    Short Term Goals: Week 2:  OT Short Term Goal 1 (Week 2): Pt will sit EOB/EOM for 5 min with MAX A for sitting balance during functional activity OT Short Term Goal 2 (Week 2): Pt will groom at sink with min A using AE PRN OT Short Term Goal 3 (Week 2): Pt will wash UB with MOD HOH A and AE PRN OT Short Term Goal 4 (Week 2): Pt will roll B in bed during ADLs with MAX A of 1 caregiver  Skilled Therapeutic Interventions/Progress Updates:    1;1. No pain reported. Pt participates in BUE therex 1x10 reps for improve BUE strength/ROM required for BADLs with min instructional cues for technique. Shoulder Flexion: min A to achieve 0-90 degree range Extension: supervision against min manual resitance Internal/external rotation against min manual resistance Horizontal ab/adduction at shoulder height with A to maintain shoulder height against min manual resistance Elevation with 2 second isometric hold  Elbow:  Flexion: 1# wrist weights Extension min manual resistance Shoulder taps 1# wrist weights  Exited session with pt seated in w/c and family present, call light in reach and all needs met.  Therapy Documentation Precautions:  Precautions Precautions: Fall, Cervical Precaution Comments: [name] - no ROM restrictions and WBAT 06/09/17;  [name] with cast  Required Braces or Orthoses: Cervical Brace Cervical Brace: Hard collar, Other (comment) Restrictions Weight Bearing Restrictions: Yes RUE Weight Bearing: Non weight bearing LUE Weight Bearing: Weight bearing as tolerated Other Position/Activity Restrictions: pt with subluxation of Rt thumb early June   See Function Navigator for Current Functional Status.   Therapy/Group: Individual  Therapy  [name] 06/20/2017, 3:28 PM

## 2017-06-20 NOTE — Progress Notes (Signed)
Occupational Therapy Session Note  Patient Details  Name: Keith [name] MRN: [id number] Date of Birth: 10/20/1995  Today's Date: 06/20/2017 OT Individual Time: 0700-0800 OT Individual Time Calculation (min): 60 min    Short Term Goals: Week 2:  OT Short Term Goal 1 (Week 2): Pt will sit EOB/EOM for 5 min with MAX A for sitting balance during functional activity OT Short Term Goal 2 (Week 2): Pt will groom at sink with min A using AE PRN OT Short Term Goal 3 (Week 2): Pt will wash UB with MOD HOH A and AE PRN OT Short Term Goal 4 (Week 2): Pt will roll B in bed during ADLs with MAX A of 1 caregiver  Skilled Therapeutic Interventions/Progress Updates:    Pt asleep upon arrival but easily aroused.  OT intervention with focus on bed mobility and directing care.  Pt incontinent of bowel and required tot A for hygiene.  Pt unable to detect that he was having bowel movement.  Pt expressed need for urinal but unable to void.  RN notified that pt stated he felt like his bladder was full.  Pt required tot A + 2 for all bed mobility.  HOB elevated to 80 degrees and practiced circle sitting with back supported.  Pt remained in bed with all needs within reach and family present.   Therapy Documentation Precautions:  Precautions Precautions: Fall, Cervical Precaution Comments: [name] - no ROM restrictions and WBAT 06/09/17;  [name] with cast  Required Braces or Orthoses: Cervical Brace Cervical Brace: Hard collar, Other (comment) Restrictions Weight Bearing Restrictions: Yes RUE Weight Bearing: Non weight bearing LUE Weight Bearing: Weight bearing as tolerated Other Position/Activity Restrictions: pt with subluxation of Rt thumb early June  Pain:  Pt denies pain  See Function Navigator for Current Functional Status.   Therapy/Group: Individual Therapy  [name], Josey Forcier Chappell 06/20/2017, 12:04 PM

## 2017-06-21 ENCOUNTER — Inpatient Hospital Stay (HOSPITAL_COMMUNITY): Payer: BLUE CROSS/BLUE SHIELD

## 2017-06-21 DIAGNOSIS — S14109S Unspecified injury at unspecified level of cervical spinal cord, sequela: Secondary | ICD-10-CM

## 2017-06-21 DIAGNOSIS — F4310 Post-traumatic stress disorder, unspecified: Secondary | ICD-10-CM

## 2017-06-21 DIAGNOSIS — G8250 Quadriplegia, unspecified: Secondary | ICD-10-CM

## 2017-06-21 DIAGNOSIS — G8918 Other acute postprocedural pain: Secondary | ICD-10-CM

## 2017-06-21 MED ORDER — LIDOCAINE HCL 2 % EX GEL
1.0000 "application " | CUTANEOUS | Status: DC | PRN
  Filled 2017-06-21 (×2): qty 5

## 2017-06-21 MED ORDER — LIDOCAINE HCL 2 % EX GEL
1.0000 "application " | CUTANEOUS | Status: DC | PRN
  Filled 2017-06-21: qty 10

## 2017-06-21 NOTE — Progress Notes (Signed)
Pt complained of pain/discomfort with I&O cath. Suggest use of lidocaine, new order received from Dr. Allena KatzPatel for lidocaine use with each I&O cath.

## 2017-06-21 NOTE — Progress Notes (Signed)
Occupational Therapy Session Note  Patient Details  Name: Keith [name] MRN: [id number] Date of Birth: 06/28/1995  Today's Date: 06/21/2017 OT Individual Time: 1250-1400 OT Individual Time Calculation (min): 70 min   Skilled Therapeutic Interventions/Progress Updates:    1:1. RN and pt asking to attempt to shower as OT entering room while RN cathing pt. Educated pt on BP/upright tolerance/sitting balance potentially impacting ability to safely shower as well as various shower equipment available. Attempted to transfer via maxi move to roll in bariatric shower chair 2x, however pt unable to tolerate upright position wihtout and with ted hose donned. Pt reporting low BP symptoms upon lowing to seat and pt returned to bed for safety. Pt rolls with MAX A +2 for pt to don new brief and shorts. Pt requesting to transfer to w/c and OT begins family education with sister on use of maxi sky, placing sling, locking breaks, set up of w/c and scooting pt back in chair by hips. Pt sister controls lift with supervision and mod cueing from OT as +2 A guides pt hips into chair while lowering. Family would benefit from continued education on transfers, hoyer lifts, pressure relief, and w/c parts management. Exited session with pt seated in w/c asleep with dad and sister present.   Therapy Documentation Precautions:  Precautions Precautions: Fall, Cervical Precaution Comments: [name] - no ROM restrictions and WBAT 06/09/17;  [name] with cast  Required Braces or Orthoses: Cervical Brace Cervical Brace: Hard collar, Other (comment) Restrictions Weight Bearing Restrictions: Yes RUE Weight Bearing: Non weight bearing LUE Weight Bearing: Weight bearing as tolerated Other Position/Activity Restrictions: pt with subluxation of Rt thumb early June   See Function Navigator for Current Functional Status.   Therapy/Group: Individual Therapy  Shon HaleStephanie M Shacarra Choe 06/21/2017, 2:12 PM

## 2017-06-21 NOTE — Progress Notes (Addendum)
Arrived at pt room 0835 to administer morning meds, pt asleep in bed, easily aroused. Pt did not want medications at this time and requested RN come back in 1 hour. Medication times this AM adjusted at pt request.
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
## 2017-06-22 ENCOUNTER — Inpatient Hospital Stay (HOSPITAL_COMMUNITY): Payer: BLUE CROSS/BLUE SHIELD | Admitting: Physical Therapy

## 2017-06-22 DIAGNOSIS — N319 Neuromuscular dysfunction of bladder, unspecified: Secondary | ICD-10-CM

## 2017-06-22 DIAGNOSIS — M792 Neuralgia and neuritis, unspecified: Secondary | ICD-10-CM

## 2017-06-22 DIAGNOSIS — K592 Neurogenic bowel, not elsewhere classified: Secondary | ICD-10-CM

## 2017-06-22 MED ORDER — LIDOCAINE HCL 2 % EX GEL
1.0000 "application " | CUTANEOUS | Status: DC | PRN
  Administered 2017-06-22 (×2): 1 via URETHRAL
  Filled 2017-06-22 (×7): qty 5

## 2017-06-22 MED ORDER — LIDOCAINE HCL 2 % EX GEL: 1.0000 "application " | CUTANEOUS | Status: DC | PRN

## 2017-06-22 NOTE — Progress Notes (Signed)
Physical Therapy Session Note  Patient Details  Name: Keith [name] MRN: [id number] Date of Birth: 06/08/1995  Today's Date: 06/22/2017 PT Individual Time: 0900-1000 PT Individual Time Calculation (min): 60 min   Short Term Goals: Week 2:  PT Short Term Goal 1 (Week 2): Pt will demonstrate rolling R/L maxA +1 PT Short Term Goal 2 (Week 2): Pt will demonstrate upright tolerance x45 min sitting in w/c without change in vitals PT Short Term Goal 3 (Week 2): Pt will direct care for transfer w/c <>bed with minimal cueing. PT Short Term Goal 4 (Week 2): Pt will perform car transfer with maxA +2  Skilled Therapeutic Interventions/Progress Updates: Pt received supine in bed with girlfriend and sister assisting with feeding breakfast; denies pain but reports "heaviness" in UEs and requests medication before beginning session. BLE TEDs, ace wraps, shoe and leg loops donned totalA. Applied one less set of ace wraps than normal (2 vs 3) to progress sitting tolerance and BP regulation for functional activities such as showering. Rolling R/L maxA to don shorts. Supine>sit totalA. Lateral transfer to w/c totalA; +2 to stabilize transfer board. Performed UE ergometer x8 min total with ace wraps to maintain hands on grips; totalA for sitting balance d/t consistent LOB to R side. Performed scapular pushups propped on forearms on table; educated pt on role of serratus anterior in scapular stability for carryover into functional activities and reducing risk of orthopedic complications/pain. Performed reclined w/c crunches to facilitate core activation; unable to determine if musculature activating or movement d/t head positioning. Remained semi-reclined in w/c at end of session, family present and all needs in reach.      Therapy Documentation Precautions:  Precautions Precautions: Fall, Cervical Precaution Comments: [name] - no ROM restrictions and WBAT 06/09/17;  [name] with cast   Required Braces or Orthoses: Cervical Brace Cervical Brace: Hard collar, Other (comment) Restrictions Weight Bearing Restrictions: Yes RUE Weight Bearing: Non weight bearing LUE Weight Bearing: Weight bearing as tolerated Other Position/Activity Restrictions: pt with subluxation of Rt thumb early June    See Function Navigator for Current Functional Status.   Therapy/Group: Individual Therapy  Vista Lawmanlizabeth J Tygielski 06/22/2017, 10:41 AM
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
## 2017-06-23 ENCOUNTER — Inpatient Hospital Stay (HOSPITAL_COMMUNITY): Payer: BLUE CROSS/BLUE SHIELD

## 2017-06-23 ENCOUNTER — Inpatient Hospital Stay (HOSPITAL_COMMUNITY): Payer: BLUE CROSS/BLUE SHIELD | Admitting: Physical Therapy

## 2017-06-23 LAB — BASIC METABOLIC PANEL
ANION GAP: 10 (ref 5–15)
BUN: 16 mg/dL (ref 6–20)
CO2: 21 mmol/L — AB (ref 22–32)
Calcium: 9.4 mg/dL (ref 8.9–10.3)
Chloride: 103 mmol/L (ref 101–111)
Creatinine, Ser: 0.72 mg/dL (ref 0.61–1.24)
GFR calc Af Amer: >60 mL/min (ref >60)
GFR calc non Af Amer: >60 mL/min (ref >60)
GLUCOSE: 110 mg/dL — AB (ref 65–99)
POTASSIUM: 4.3 mmol/L (ref 3.5–5.1)
Sodium: 134 mmol/L — ABNORMAL LOW (ref 135–145)

## 2017-06-23 LAB — CBC WITH DIFFERENTIAL/PLATELET
Basophils Absolute: 0 10*3/uL (ref 0.0–0.1)
Basophils Relative: 1 %
Eosinophils Absolute: 0.1 10*3/uL (ref 0.0–0.7)
Eosinophils Relative: 2 %
HEMATOCRIT: 33 % — AB (ref 39.0–52.0)
Hemoglobin: 10.6 g/dL — ABNORMAL LOW (ref 13.0–17.0)
LYMPHS PCT: 32 %
Lymphs Abs: 1.9 10*3/uL (ref 0.7–4.0)
MCH: 28 pg (ref 26.0–34.0)
MCHC: 32.1 g/dL (ref 30.0–36.0)
MCV: 87.1 fL (ref 78.0–100.0)
MONO ABS: 0.4 10*3/uL (ref 0.1–1.0)
MONOS PCT: 8 %
NEUTROS ABS: 3.3 10*3/uL (ref 1.7–7.7)
Neutrophils Relative %: 57 %
Platelets: 381 10*3/uL (ref 150–400)
RBC: 3.79 MIL/uL — ABNORMAL LOW (ref 4.22–5.81)
RDW: 13.1 % (ref 11.5–15.5)
WBC: 5.7 10*3/uL (ref 4.0–10.5)

## 2017-06-23 NOTE — Progress Notes (Signed)
Physical Therapy Session Note  Patient Details  Name: [name] MRN: [id number] Date of Birth: 08/30/1995  Today's Date: 06/23/2017 PT Individual Time: 0900-1000 1430-1540 PT Individual Time Calculation (min): 60 min and 70 min 110 min total  Skilled Therapeutic Interventions/Progress Updates:    Tx 1: Pt greeted supine in bed, agreeable to therapy, dad present and reports pt needs new brief. Pt rolls R/L totalA+2 using UE to hook therapist arm, ted hose, ACE wraps, shorts, leg loops, and shoes donned totalA in supine. Pt transitions supine to sit with totalA, able to use leg loops to initiate legs off bed, transfers bed>WC<>mat using slide board TotalA +2. Performs 10 minutes on arm bike using BUE, modA for maintaining upright as pt trunk leans R continuously. Pt reports fatigue today, HR elevated to 100bpm during activity. On mat, pt performs 10 chest presses with unweighted ball, 10 trunk rotations to each side holding ball, requires maxA to maintain dynamic sitting balance.  Tx 2: Pt greeted sitting in TIS, sister present in room but not throughout session, nurse finishing administering meds, agreeable to therapy. Therapist answered sister's questions regarding TIS vs manual WC, postural control progression, and abdominal binder use. Pt transported dependently to therapy gym, transfers WC<>mat with totalA +2 using slide board. Pt performs 1x 10 sit-ups from therapy ball using BUE to weight shift forward, attempted forearms propped on knees however pt unable to maintain balance in this position, 1 x 10 push-ups from propped on knees position, 1 x 10 push-ups from lateral propped position with varying UE BOS, wheelchair glove with dycem on palm added to simulate wheelchair pushing cuffs with improved ability to grip onto mat. Modifications made to manual WC for optimal positioning, pt propels WC using BUE approximately 210ft with theraband wrapped around rims for added grip. At end of session,  pt returned to room, transfers WC>bed with totalA +2 using slideboard, transitions sit>supine with totalA for nursing to perform catheterization, sister present in room, denies any needs at this time.    Therapy Documentation Precautions:  Precautions Precautions: Fall, Cervical Precaution Comments: [name] - no ROM restrictions and WBAT 06/09/17;  [name] with cast  Required Braces or Orthoses: Cervical Brace Cervical Brace: Hard collar, Other (comment) Restrictions Weight Bearing Restrictions: Yes RUE Weight Bearing: Non weight bearing LUE Weight Bearing: Weight bearing as tolerated Other Position/Activity Restrictions: pt with subluxation of Rt thumb early June    See Function Navigator for Current Functional Status.   Therapy/Group: Individual Therapy  Theodosia QuayMorgan Jaun Galluzzo 06/23/2017, 12:23 PM

## 2017-06-23 NOTE — Progress Notes (Signed)
Occupational Therapy Session Note  Patient Details  Name: Keith [name] MRN: [id number] Date of Birth: 03/23/1996  Today's Date: 06/23/2017 OT Individual Time: 1100-1155 OT Individual Time Calculation (min): 55 min    Short Term Goals: Week 2:  OT Short Term Goal 1 (Week 2): Pt will sit EOB/EOM for 5 min with MAX A for sitting balance during functional activity OT Short Term Goal 2 (Week 2): Pt will groom at sink with min A using AE PRN OT Short Term Goal 3 (Week 2): Pt will wash UB with MOD HOH A and AE PRN OT Short Term Goal 4 (Week 2): Pt will roll B in bed during ADLs with MAX A of 1 caregiver  Skilled Therapeutic Interventions/Progress Updates:    Pt resting in w/c upon arrival with sister present.  OT intervention with focus on BUE AAROM and active assist for elbow flexion, horizontal adduction, and shoulder flexion/extension on table top with towel to reduce friction.  Pt required assistance at end ranges.  B shoulder PROM performed with no pain. Pt remained in w/c with sister present.   Therapy Documentation Precautions:  Precautions Precautions: Fall, Cervical Precaution Comments: [name] - no ROM restrictions and WBAT 06/09/17;  [name] with cast  Required Braces or Orthoses: Cervical Brace Cervical Brace: Hard collar, Other (comment) Restrictions Weight Bearing Restrictions: Yes RUE Weight Bearing: Non weight bearing LUE Weight Bearing: Weight bearing as tolerated Other Position/Activity Restrictions: pt with subluxation of Rt thumb early June    Pain:  Pt denies pain  See Function Navigator for Current Functional Status.   Therapy/Group: Individual Therapy  [name], Benn Tarver Chappell 06/23/2017, 12:11 PM

## 2017-06-23 NOTE — Progress Notes (Signed)
Occupational Therapy Note  Patient Details  Name: Keith [name] MRN: [id number] Date of Birth: 04/07/1996  Today's Date: 06/23/2017 OT Individual Time: 4098-11911330-1425 OT Individual Time Calculation (min): 55 min   Pt denies pain Individual Therapy  Pt resting in TIS w/c upon arrival with sister present.  OT intervention with focus on grasp/release with tenodesis.  Applied estim to facilitate finger flexion without tenodesis without success.  Focus transition to BUE PROM/AAROM at shoulder and elbow.  Pt engaged in BUE therex with 1# weight bar (hands ace wrapped to grasp). Pt performed 3 sets of 15 with chest presses and overhead presses with min A to maintain bar against gravity.  Pt remained in w/c with all needs within reach.    [name], Sheria Rosello [facility] Hospital[facility]appell 06/23/2017, 2:43 PM
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
## 2017-06-24 ENCOUNTER — Inpatient Hospital Stay (HOSPITAL_COMMUNITY): Payer: BLUE CROSS/BLUE SHIELD | Admitting: Physical Therapy

## 2017-06-24 ENCOUNTER — Inpatient Hospital Stay (HOSPITAL_COMMUNITY): Payer: BLUE CROSS/BLUE SHIELD | Admitting: Occupational Therapy

## 2017-06-24 NOTE — Progress Notes (Signed)
Occupational Therapy Session Note  Patient Details  Name: Keith [name] MRN: [id number] Date of Birth: 05/19/1995  Today's Date: 06/24/2017 OT Individual Time: 7829-56210930-1055 OT Individual Time Calculation (min): 85 min    Short Term Goals: Week 2:  OT Short Term Goal 1 (Week 2): Pt will sit EOB/EOM for 5 min with MAX A for sitting balance during functional activity OT Short Term Goal 2 (Week 2): Pt will groom at sink with min A using AE PRN OT Short Term Goal 3 (Week 2): Pt will wash UB with MOD HOH A and AE PRN OT Short Term Goal 4 (Week 2): Pt will roll B in bed during ADLs with MAX A of 1 caregiver  Skilled Therapeutic Interventions/Progress Updates:    OT intervention with focus on sitting balance, BUE functional use, BADL retraining, bed mobility, and activity tolerance. Pt transferred to rolling shower chair with Maxi Lift.  Pt used LUE/hand with bath mitt to participate in bathing tasks.  Pt able to bathe chest, stomach, and initiate bathing face.  Pt required assistance to complete all other bathing tasks.  Pt required tot A for dressing tasks at bed level.  Pt required tot A for sitting balance and weight shifts when sitting in shower chair.  Pt remained in bed with NT attending and sister present.   Therapy Documentation Precautions:  Precautions Precautions: Fall, Cervical Precaution Comments: [name] - no ROM restrictions and WBAT 06/09/17;  [name] with cast  Required Braces or Orthoses: Cervical Brace Cervical Brace: Hard collar, Other (comment) Restrictions Weight Bearing Restrictions: Yes RUE Weight Bearing: Non weight bearing LUE Weight Bearing: Weight bearing as tolerated Other Position/Activity Restrictions: pt with subluxation of Rt thumb early June     Pain: Pain Assessment Pain Score: 0-No pain  See Function Navigator for Current Functional Status.   Therapy/Group: Individual Therapy  [name], Sheronica Corey Chappell 06/24/2017, 12:15 PM

## 2017-06-24 NOTE — Progress Notes (Signed)
Physical Therapy Session Note  Patient Details  Name: Keith [name] MRN: [id number] Date of Birth: Mar 29, 1996  Today's Date: 06/24/2017 PT Individual Time: 0800-0900 and 1430-1530 PT Individual Time Calculation (min): 60 min and 60 min (total 120 min)   Short Term Goals: Week 2:  PT Short Term Goal 1 (Week 2): Pt will demonstrate rolling R/L maxA +1 PT Short Term Goal 2 (Week 2): Pt will demonstrate upright tolerance x45 min sitting in w/c without change in vitals PT Short Term Goal 3 (Week 2): Pt will direct care for transfer w/c <>bed with minimal cueing. PT Short Term Goal 4 (Week 2): Pt will perform car transfer with maxA +2  Skilled Therapeutic Interventions/Progress Updates: Tx 1: Pt received seated in bed, requesting pain medication and RN present at beginning of session to administer. BLE TEDs, ace wraps, shorts, leg loops totalA. Rolling R/L maxA +2 to pull up pants and transition supine>sit. Lateral scoot transfer x3 during session with maxA +2; improving positioning and functional use of LUE during transfers in correct fisted position to maintain shortened finger flexor length and functional tenodesis. Sitting balance on mat in long sitting standbyA>maxA with LOBs, however able to demo static balance 20-30 sec at a time. Practiced reaching UEs for feet; able to reach feet but not able to perform any functional task or assist with LE movement from that position. Pt able to assist with pulling LE positioning one at a time into half circle sitting for functional positioning and hip ER PROM. Pt reports since mother has gone out of town, sister primarily staying with pt to assist as needed but has not been performing daily PROM to LEs; encouraged pt to direct care for sister performing stretches and refer to handout as needed. Discussed with pt and family goal of performing slideboard transfer for car transfers at d/c, and plan to practice in/out of family's vehicle prior to d/c. Sister  wondering if they'll "be able to stand him up to get in the car" by the time they leave; discussed that LEs not currently demonstrating any activation, and attempting to stand/lift pt into a standing position to transfer could be dangerous for pt and caregiver; reiterated plan for transfer board in/out of car. Pt propelled w/c x10' with min/modA, hand-over-hand assist for correct placement on w/c rims. Remained seated in w/c at end of session, all needs in reach.   Tx 2: Pt received seated in bed, denies pain and agreeable to treatment. Lateral scoot transfer bed <> tilt table totalA +2 in supine. Pt educated in benefits of tilt table for cardiopulmonary benefits, bowel/bladder function and reduced risk of infection, PROM to BLE, WB to promote possible neuromotor recovery. Supine BP 146/67 with BLE TEDs, ace wraps x3 to upper thigh, abdominal binder. Standing x10 min at 30 degrees with BP dropped to 97/56 however pt asymptomatic. Increased table to 40 degrees x3 min before pt reporting "fish bowl" sound in L ear; returned to supine for 3-4 min rest break to allow BP to regulate. Returned to 40 degrees x8 min with pt asymptomatic. Progressed to 50 degrees and pt reports blurry vision after 2-3 min. Returned to supine; transferred back to bed as above. Remained supine in bed at end of session, all needs in reach.      Therapy Documentation Precautions:  Precautions Precautions: Fall, Cervical Precaution Comments: [name] - no ROM restrictions and WBAT 06/09/17;  [name] with cast  Required Braces or Orthoses: Cervical Brace Cervical Brace:  Hard collar, Other (comment) Restrictions Weight Bearing Restrictions: Yes RUE Weight Bearing: Non weight bearing LUE Weight Bearing: Weight bearing as tolerated Other Position/Activity Restrictions: pt with subluxation of Rt thumb early June    See Function Navigator for Current Functional Status.   Therapy/Group: Individual  Therapy  Vista Lawmanlizabeth J Tygielski 06/24/2017, 10:20 AM

## 2017-06-24 NOTE — Progress Notes (Signed)
Orthopedic Tech Progress Note Patient Details:  Keith [name] 10/29/1995 [id number]  Ortho Devices Type of Ortho Device: Thumb velcro splint Ortho Device/Splint Location: rue Ortho Device/Splint Interventions: Application   Post Interventions Patient Tolerated: Well Instructions Provided: Care of device   Nikki DomCrawford, Torrin Frein 06/24/2017, 11:24 AM Viewed order from doctor's order list
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
## 2017-06-25 ENCOUNTER — Encounter (HOSPITAL_COMMUNITY): Payer: BLUE CROSS/BLUE SHIELD | Admitting: Psychology

## 2017-06-25 ENCOUNTER — Inpatient Hospital Stay (HOSPITAL_COMMUNITY): Payer: BLUE CROSS/BLUE SHIELD | Admitting: Occupational Therapy

## 2017-06-25 ENCOUNTER — Inpatient Hospital Stay (HOSPITAL_COMMUNITY): Payer: BLUE CROSS/BLUE SHIELD

## 2017-06-25 ENCOUNTER — Inpatient Hospital Stay (HOSPITAL_COMMUNITY): Payer: BLUE CROSS/BLUE SHIELD | Admitting: *Deleted

## 2017-06-25 NOTE — Progress Notes (Signed)
Occupational Therapy Session Note  Patient Details  Name: Keith SellCameron R XXXDillard MRN: [id number] Date of Birth: 01/23/1996  Today's Date: 06/25/2017 OT Individual Time: 0700-0800 OT Individual Time Calculation (min): 60 min    Short Term Goals: Week 2:  OT Short Term Goal 1 (Week 2): Pt will sit EOB/EOM for 5 min with MAX A for sitting balance during functional activity OT Short Term Goal 2 (Week 2): Pt will groom at sink with min A using AE PRN OT Short Term Goal 3 (Week 2): Pt will wash UB with MOD HOH A and AE PRN OT Short Term Goal 4 (Week 2): Pt will roll B in bed during ADLs with MAX A of 1 caregiver  Skilled Therapeutic Interventions/Progress Updates:    OT intervention with focus on bed mobility, semi-circle sitting in bed, UB dressing tasks, and activity tolerance to increase independence with BADLs.  Pt resting in bed upon arrival.  Pt stated he had not received suppository until midnight and presently incontinent of bowel.  Pt required tot A + 2 for rolling in bed multiple times to perform hygiene, changing brief, and donning pants.  Teds and ace wraps applied before sitting in bed.  Pt required max A for sitting unsupported in bed with BLE placed in semi-circle position.  Pt required mod A for sitting balance and max A for balance when attempting to don shirt.  Pt remained in bed with all needs within reach and father present.   Therapy Documentation Precautions:  Precautions Precautions: Fall, Cervical Precaution Comments: [name] - no ROM restrictions and WBAT 06/09/17;  [name] with cast  Required Braces or Orthoses: Cervical Brace Cervical Brace: Hard collar, Other (comment) Restrictions Weight Bearing Restrictions: Yes RUE Weight Bearing: Non weight bearing LUE Weight Bearing: Weight bearing as tolerated Other Position/Activity Restrictions: pt with subluxation of Rt thumb early June  Pain:  Pt denies pain   See Function Navigator for Current  Functional Status.   Therapy/Group: Individual Therapy  [name], Deryn Massengale Chappell 06/25/2017, 8:05 AM

## 2017-06-25 NOTE — Progress Notes (Signed)
Occupational Therapy Note  Patient Details  Name: Keith [name] MRN: [id number] Date of Birth: 12/09/1995  Today's Date: 06/25/2017 OT Individual Time: [id number] OT Individual Time Calculation (min): 28 min   Pt denies pain Individual Therapy  Pt resting in bed upon arrival with PT present.  OT intervention with focus on BUE therex combined with reaching tasks-emphasis on controlled UE movements.  Pt with improved shoulder strength/movement but requires assistance with maintaining control of forearm when moving against gravity.  Focus on maintaining eccentric/concentric control to maintain forearm in upright position.  Pt with improved shoulder AROM when supine in bed with HOB elevated.  Pt able to reach R/L ear/temple area but unable to reach forehead.  Pt remained in bed with soft call bell placed on chest.  Pt able to activate call bell.    Keith [name], Keith [name] [facility]ell 06/25/2017, 2:44 PM

## 2017-06-25 NOTE — Progress Notes (Signed)
Physical Therapy Session Note  Patient Details  Name: Keith [name] MRN: [id number] Date of Birth: 07/11/1995  Today's Date: 06/25/2017 PT Individual Time: 4098-11910845-0940 PT Individual Time Calculation (min): 55 min   Short Term Goals: Week 2:  PT Short Term Goal 1 (Week 2): Pt will demonstrate rolling R/L maxA +1 PT Short Term Goal 2 (Week 2): Pt will demonstrate upright tolerance x45 min sitting in w/c without change in vitals PT Short Term Goal 3 (Week 2): Pt will direct care for transfer w/c <>bed with minimal cueing. PT Short Term Goal 4 (Week 2): Pt will perform car transfer with maxA +2  Skilled Therapeutic Interventions/Progress Updates:    Session focused on functional bed mobility for rolling for donning of sling and maxislide (AB donned prior to OOB also) in preparation for use of tilt table for NMR. Pt able to hook with UE during rolling and max assist with +2 for assist with equipment and LE placement in bed and on the tilt table. Utilized maxislide for lateral transfer from bed <> w/c with +2 assist. Re-educated on benefits from use of Tilt Table and pt verbalized understanding including weightbearing, respiratory function, and postural control and bowel and bladder function. Pt able to demonstrate improved tolerance today with vitals indicated below for longer time, higher degree, and pt asymptomatic.  HOB elevated BP = 121/69 mmHg; HR = 92 bpm 30 degrees BP = 100/52 mmHg; HR = 102 bpm x 4.5 min 30 degrees BP = 108/61 mmHg; HR = 98 bpm x 7 min 35 degrees BP = 105/56 mmHg; HR = 97 bpm x 10 min 40 degrees BP = 100/51 mmHg; HR = 102 bpm x 13 min 40 degrees BP = 95/49 mmHg; HR = 104 bpm x 15.5 min Supine BP = 121/81 mmHg; HR = 89 bpm after being up for 16 min total  Utilized maxi move to transfer from tilt table to TIS w/c and repositioned and left in care of family.    Therapy Documentation Precautions:  Precautions Precautions: Fall, Cervical Precaution Comments: Lt  scapular fracture - no ROM restrictions and WBAT 06/09/17;  [name] with cast  Required Braces or Orthoses: Cervical Brace Cervical Brace: Hard collar, Other (comment) Restrictions Weight Bearing Restrictions: Yes RUE Weight Bearing: Non weight bearing LUE Weight Bearing: Weight bearing as tolerated Other Position/Activity Restrictions: pt with subluxation of Rt thumb early June   Pain: Denies pain.    See Function Navigator for Current Functional Status.   Therapy/Group: Individual Therapy  Karolee StampsGray, Keith [name]  Keith [name], PT, DPT  06/25/2017, 9:44 AM

## 2017-06-25 NOTE — Progress Notes (Addendum)
Physical Therapy Session Note  Patient Details  Name: Keith [name] MRN: [id number] Date of Birth: 11/06/1995  Today's Date: 06/25/2017 PT Individual Time: 1001-1030 PT Individual Time Calculation (min): 29 min   Short Term Goals: Week 2:  PT Short Term Goal 1 (Week 2): Pt will demonstrate rolling R/L maxA +1 PT Short Term Goal 2 (Week 2): Pt will demonstrate upright tolerance x45 min sitting in w/c without change in vitals PT Short Term Goal 3 (Week 2): Pt will direct care for transfer w/c <>bed with minimal cueing. PT Short Term Goal 4 (Week 2): Pt will perform car transfer with maxA +2  Skilled Therapeutic Interventions/Progress Updates: Pt presented in [place] chair with family present agreeable to therapy. Pt transported to rehab gym and performed SB transfer to mat maxA x 2. Pt participated in upright sitting with physioball support with UE reaching within available range. Pt able to show trace amounts of core activation to initiate trunk flexion.  Pt indicated feeling "fishbowl" affect, BP checked 93/54 HR 102. Pt placed in semi-recumbent position and indicated feeling better. Pt returned to TIS in maxA x 2 and returned to room with needs met and family present.      Therapy Documentation Precautions:  Precautions Precautions: Fall, Cervical Precaution Comments: [name] - no ROM restrictions and WBAT 06/09/17;  [name] with cast  Required Braces or Orthoses: Cervical Brace Cervical Brace: Hard collar, Other (comment) Restrictions Weight Bearing Restrictions: Yes RUE Weight Bearing: Non weight bearing LUE Weight Bearing: Weight bearing as tolerated Other Position/Activity Restrictions: pt with subluxation of Rt thumb early June  General:   Vital Signs: Therapy Vitals Temp: 98.5 F (36.9 C) Temp Source: Oral Pulse Rate: 66 Resp: 16 BP: 114/63 Patient Position (if appropriate): Lying Oxygen Therapy SpO2: 99 % O2 Device: Not Delivered  See Function  Navigator for Current Functional Status.   Therapy/Group: Individual Therapy  Mikki Ziff  Vermelle Cammarata, PTA  06/25/2017, 4:47 PM
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
## 2017-06-25 NOTE — Progress Notes (Signed)
Pt's bladder scanned after 4 hours and Bladder scanner reads 0. Bladder scanner battery put on charger due to reading being inaccurate. RN scanned patients bladder at 1830 and pt's bladder scan reads 450. Pt refused to allow RN to empty bladder at this time because he would like to eat first. Pt educated on importance of following cath schedule to keep volumes low and establishing a regular bladder program.

## 2017-06-25 NOTE — Progress Notes (Signed)
Physical Therapy Note  Patient Details  Name: Keith [name] MRN: [id number] Date of Birth: 12/03/1995 Today's Date: 06/25/2017  tx 1:  1300-1315, 15 min indivvidaul tx Pain: none  [place], RN needed to cath pt.  PT assisted in placing sling behind pt in tilt in space w/c and transferring him to bed.  tx 2:  1325-1410, 45 min individual tx Pain: none per pt  PT returned after cathing.  Pt able to use controlller with R knucle to activate HOB to lower bed.  tx focused on bed mobility: rolling from supine/ part side lying> R/L side lying with set up assist for bil LEs, wearing non slip socks, using bil UEs to pull on side rail when rolling L, and LUE to pull on side rail when rolling R.  PT donned leg loops, and instructed pt in use with mod assist to get strap around wrist for flexing LEs toward chest, and max assist for controlling neutral hip rotation as LE allowed to extend flat onto bed.  Blocked practice R/LEs using leg loops.  neuromuscular re-education via multimodal cues and demo for isolating R/L shoulder abduction without elbow flexion in order to safely use momentum/punch with UE to initiate rolling. Pt often flexed elbow at same time, resulting in a safety risk of hitting himself in the face with his hands as he initiated rolling.  Pt left resting in bed with all needs within reach; passed of to next therapist, Elijah Birkom, COTA.  See function navigator for current status.  Keith [name] 06/25/2017, 1:47 PM
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
## 2017-06-26 ENCOUNTER — Inpatient Hospital Stay (HOSPITAL_COMMUNITY): Payer: BLUE CROSS/BLUE SHIELD | Admitting: Occupational Therapy

## 2017-06-26 ENCOUNTER — Inpatient Hospital Stay (HOSPITAL_COMMUNITY): Payer: BLUE CROSS/BLUE SHIELD
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
## 2017-06-26 NOTE — Progress Notes (Signed)
Occupational Therapy Session Note  Patient Details  Name: Keith [name] MRN: [id number] Date of Birth: 08/27/1995  Today's Date: 06/26/2017 OT Individual Time: 0700-0800 OT Individual Time Calculation (min): 60 min    Short Term Goals: Week 2:  OT Short Term Goal 1 (Week 2): Pt will sit EOB/EOM for 5 min with MAX A for sitting balance during functional activity OT Short Term Goal 2 (Week 2): Pt will groom at sink with min A using AE PRN OT Short Term Goal 3 (Week 2): Pt will wash UB with MOD HOH A and AE PRN OT Short Term Goal 4 (Week 2): Pt will roll B in bed during ADLs with MAX A of 1 caregiver  Skilled Therapeutic Interventions/Progress Updates:    OT intervention with focus on bed mobility and sitting balance in long sitting (bed level). Pt requires tot A+2 for rolling side to side in bed to change brief and pull pants over hips.  After [name] and Ace wraps donned, HOB elevated to 80 degrees and pt challenged to come to long sitting.  Pt required max A to come to long sitting.  Pt challenged with maintaining sitting balance when reaching with BUE alternately towards feet.  Pt able to minor adjustments but requires increased assistance when leaning laterally.  Pt remained in bed with family present.   Therapy Documentation Precautions:  Precautions Precautions: Fall, Cervical Precaution Comments: [name] - no ROM restrictions and WBAT 06/09/17;  [name] with cast  Required Braces or Orthoses: Cervical Brace Cervical Brace: Hard collar, Other (comment) Restrictions Weight Bearing Restrictions: Yes RUE Weight Bearing: Non weight bearing LUE Weight Bearing: Weight bearing as tolerated Other Position/Activity Restrictions: pt with subluxation of Rt thumb early June  Pain:  Pt denies pain  See Function Navigator for Current Functional Status.   Therapy/Group: Individual Therapy  [name], Venita Seng Chappell 06/26/2017, 8:05 AM
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
## 2017-06-26 NOTE — Progress Notes (Signed)
Social Work Patient ID: Keith [name], male   DOB: 11/26/1995, 22 y.o.   MRN: [id number]  Have reviewed team conference with pt (brother present as well) who is aware team continues to plan toward 3/1 discharge date and max assist w/c level goals.  Pt continues to be very reserved with me and always denies any questions or concerns.  Neuropsychology is continuing to follow for emotional support.  Family remains very involved and supportive as well.    Keith Kinnear, LCSW

## 2017-06-26 NOTE — Progress Notes (Signed)
Physical Therapy Session Note  Patient Details  Name: [name] MRN: [id number] Date of Birth: 10/09/1995  Today's Date: 06/26/2017 PT Individual Time: 1100-1155 PT Individual Time Calculation (min): 55 min   Short Term Goals: Week 2:  PT Short Term Goal 1 (Week 2): Pt will demonstrate rolling R/L maxA +1 PT Short Term Goal 2 (Week 2): Pt will demonstrate upright tolerance x45 min sitting in w/c without change in vitals PT Short Term Goal 3 (Week 2): Pt will direct care for transfer w/c <>bed with minimal cueing. PT Short Term Goal 4 (Week 2): Pt will perform car transfer with maxA +2  Skilled Therapeutic Interventions/Progress Updates:    Session focused on pt instruction to PT for assistance for bed mobility, transition supine -> sit, and slideboard transfer into w/c with education on importance of being able to advocate for self and instruct caregivers especially in case of emergency. Pt will benefit from continued practice to be more specific in his commands, but overall able to grossly instruct through correct sequencing. NMR throughout this to address functional balance, bed mobility, and trunk control during slideboard transfer and repositioning in w/c. Pt utilizing leg loops throughout with cues for technique and assist for balance due to postural control impairments. Focused on utilizing leg loops for balance once in w/c, management of BLE during donning of leg rests, and while PT assisted with repositioning hips in manual w/c (+2 assist needed). Pt requires +2 assist for safety and physical assist during these mobility tasks. In ultralightweight manual w/c, focused on NMR in BUE for w/c propulsion (w/c glove and grippy sock on R hand over brace for increased grip). Pt able to propel ranging from supervision to mod assist during turning or uneven surfaces on the floor. Rest breaks needed due to fatigue. Chest strap and seatbelt utilized for safety due to poor postural control. Pt  able to propel total 150' with rest breaks about every 10-25'.   Therapy Documentation Precautions:  Precautions Precautions: Fall, Cervical Precaution Comments: [name] - no ROM restrictions and WBAT 06/09/17;  [name] with wrist splint to be worn during WB only Required Braces or Orthoses: Cervical Brace, Other Brace/Splint Cervical Brace: Hard collar, Other (comment)(can be off in the bed; on for OOB) Other Brace/Splint: R wrist brace for WB 2/13 Restrictions Weight Bearing Restrictions: No RUE Weight Bearing: Weight bearing as tolerated(only with R wrist brace donned) LUE Weight Bearing: Weight bearing as tolerated Other Position/Activity Restrictions: pt with subluxation of Rt thumb early June    Pain: Denies pain.    See Function Navigator for Current Functional Status.   Therapy/Group: Individual Therapy  Karolee StampsGray, Kayron Hicklin Darrol PokeBrescia  Lorrena Goranson B. Lamarion Mcevers, PT, DPT  06/26/2017, 12:09 PM

## 2017-06-26 NOTE — Progress Notes (Signed)
Occupational Therapy Session Note  Patient Details  Name: Keith [name] MRN: [id number] Date of Birth: 04/16/1996  Today's Date: 06/26/2017 OT Individual Time: 1430-1500 OT Individual Time Calculation (min): 30 min     Skilled Therapeutic Interventions/Progress Updates:    Pt seated in wheelchair upon entering the room with no c/o pain this session. Pt requesting to continue working on UE's and propelling wheelchair. Pt has coband on L hand and gripper sock placed on R hand to increase pt's success with task. Pt having 3 bouts of 30-50 feet with propelling chair with min - mod A depending on level of fatigue. Pt taking seated rest break.  Focus on AAROM B UE's for shoulder and elbow in all planes 3 sets of 15. Pt returning to room with family members present and remaining in wheelchair. All needs within reach.   Therapy Documentation Precautions:  Precautions Precautions: Fall, Cervical Precaution Comments: [name] - no ROM restrictions and WBAT 06/09/17;  [name] with wrist splint to be worn during WB only Required Braces or Orthoses: Cervical Brace, Other Brace/Splint Cervical Brace: Hard collar, Other (comment)(can be off in the bed; on for OOB) Other Brace/Splint: R wrist brace for WB 2/13 Restrictions Weight Bearing Restrictions: No RUE Weight Bearing: Weight bearing as tolerated(only with R wrist brace donned) LUE Weight Bearing: Weight bearing as tolerated Other Position/Activity Restrictions: pt with subluxation of Rt thumb early June  General:   Vital Signs: Therapy Vitals Temp: 98.9 F (37.2 C) Temp Source: Oral Pulse Rate: 85 Resp: 16 BP: 122/70 Patient Position (if appropriate): Sitting Oxygen Therapy SpO2: 100 % O2 Device: Not Delivered Pain:   ADL:   Vision   Perception    Praxis   Exercises:   Other Treatments:    See Function Navigator for Current Functional Status.   Therapy/Group: Individual Therapy  Alen BleacherBradsher,  [name] 06/26/2017, 4:48 PM

## 2017-06-26 NOTE — Progress Notes (Signed)
Occupational Therapy Note  Patient Details  Name: Keith [name] MRN: [id number] Date of Birth: 08/10/1995  Today's Date: 06/26/2017 OT Individual Time: 1300-1345 OT Individual Time Calculation (min): 45 min   Pt denies pain Individual Therapy  Pt resting in w/c upon arrival.  OT intervention with focus on w/c mobility and BUE activities with emphasis on control and reaction.  Coband wrapped around L hand/glove to improve ability of pt to propel w/c.  Pt propelled w/c from room to gym.  Pt remained in w/c and engaged in hitting beach ball when tossed at him with focus on BUE extension.  Pt also practiced tossing ball back to therapist with focus on arm extension with elbows tucked next to trunk to isolate triceps. Pt returned to room and remained in w/c with friend present.     [name], [name] [name] 06/26/2017, 2:40 PM

## 2017-06-27 ENCOUNTER — Inpatient Hospital Stay (HOSPITAL_COMMUNITY): Payer: BLUE CROSS/BLUE SHIELD | Admitting: Occupational Therapy

## 2017-06-27 ENCOUNTER — Inpatient Hospital Stay (HOSPITAL_COMMUNITY): Payer: BLUE CROSS/BLUE SHIELD | Admitting: *Deleted

## 2017-06-27 ENCOUNTER — Inpatient Hospital Stay (HOSPITAL_COMMUNITY): Payer: BLUE CROSS/BLUE SHIELD

## 2017-06-27 ENCOUNTER — Inpatient Hospital Stay (HOSPITAL_COMMUNITY): Payer: BLUE CROSS/BLUE SHIELD | Admitting: Physical Therapy
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
## 2017-06-27 NOTE — Progress Notes (Signed)
Physical Therapy Session Note  Patient Details  Name: Keith [name] MRN: [id number] Date of Birth: 1995-08-11  Today's Date: 06/27/2017 PT Individual Time: 1450-1517 PT Individual Time Calculation (min): 27 min   Short Term Goals: Week 1:  PT Short Term Goal 1 (Week 1): Pt will roll R/L with maxA +1  PT Short Term Goal 1 - Progress (Week 1): Progressing toward goal PT Short Term Goal 2 (Week 1): Pt will transition supine to sit with max A +2 PT Short Term Goal 2 - Progress (Week 1): Met PT Short Term Goal 3 (Week 1): Pt will transfer bed<>TIS with max A +2  PT Short Term Goal 3 - Progress (Week 1): Met PT Short Term Goal 4 (Week 1): Pt will maintain static sitting balance with min A +1 for 5 minutes with no change in vitals  PT Short Term Goal 4 - Progress (Week 1): Met Week 2:  PT Short Term Goal 1 (Week 2): Pt will demonstrate rolling R/L maxA +1 PT Short Term Goal 2 (Week 2): Pt will demonstrate upright tolerance x45 min sitting in w/c without change in vitals PT Short Term Goal 3 (Week 2): Pt will direct care for transfer w/c <>bed with minimal cueing. PT Short Term Goal 4 (Week 2): Pt will perform car transfer with maxA +2  Skilled Therapeutic Interventions/Progress Updates:    Session focused on PROM/stretching to BLE in supine for increasing flexilibility and ROM to aid with overall mobility including heel cord stretch, hip abduction/adduction, piriformis stretch (with pt utilizing leg loops to assist), and hamstring stretch. +2 assist for rolling for removal of abdominal binder and repositioning. Deferred donning PRAFO's due to pt family reporting NT to come in after session for bathing.   Therapy Documentation Precautions:  Precautions Precautions: Fall, Cervical Precaution Comments: [name] - no ROM restrictions and WBAT 06/09/17;  [name] with wrist splint to be worn during WB only Required Braces or Orthoses: Cervical Brace, Other  Brace/Splint Cervical Brace: Hard collar, Other (comment)(can be off in the bed; on for OOB) Other Brace/Splint: R wrist brace for WB 2/13 Restrictions Weight Bearing Restrictions: Yes RUE Weight Bearing: WB with wrist brace LUE Weight Bearing: Weight bearing as tolerated Other Position/Activity Restrictions: pt with subluxation of Rt thumb early June    Pain:  Denies pain.    See Function Navigator for Current Functional Status.   Therapy/Group: Individual Therapy  Canary Brim Ivory Broad, PT, DPT  06/27/2017, 3:35 PM

## 2017-06-27 NOTE — Progress Notes (Signed)
Occupational Therapy Session Note  Patient Details  Name: Keith [name] MRN: [id number] Date of Birth: 07/10/1995  Today's Date: 06/27/2017 OT Individual Time: [id number] OT Individual Time Calculation (min): 30 min    Short Term Goals: Week 2:  OT Short Term Goal 1 (Week 2): Pt will sit EOB/EOM for 5 min with MAX A for sitting balance during functional activity OT Short Term Goal 2 (Week 2): Pt will groom at sink with min A using AE PRN OT Short Term Goal 3 (Week 2): Pt will wash UB with MOD HOH A and AE PRN OT Short Term Goal 4 (Week 2): Pt will roll B in bed during ADLs with MAX A of 1 caregiver  Skilled Therapeutic Interventions/Progress Updates:    Treatment session with focus on BUE strengthening.  Pt received reclined in tilt in space w/c willing to engage in activity to address strength and motor control in BUE.  Therapist donned c-collar.  Engaged in checkers activity on vertical checker board to address shoulder flexion and tricep activation.  Pt able to lift arms to move closest checkers, however unable to elevate >90*.  Modified activity to placing checkerboard on table to increase horizontal movement with greater activation of shoulder flexion and triceps.  Engaged in horizontal abduction/adduction with hand over hand assist for abduction.  Educated on various movements to increase success with activity and identify increased challenges.  Pt returned to room and left reclined in tilt in space w/c.  Therapy Documentation Precautions:  Precautions Precautions: Fall, Cervical Precaution Comments: [name] - no ROM restrictions and WBAT 06/09/17;  [name] with wrist splint to be worn during WB only Required Braces or Orthoses: Cervical Brace, Other Brace/Splint Cervical Brace: Hard collar, Other (comment)(can be off in the bed; on for OOB) Other Brace/Splint: R wrist brace for WB 2/13 Restrictions Weight Bearing Restrictions: Yes RUE Weight Bearing: Non  weight bearing LUE Weight Bearing: Weight bearing as tolerated Other Position/Activity Restrictions: pt with subluxation of Rt thumb early June  Pain:  Pt with no c/o pain  See Function Navigator for Current Functional Status.   Therapy/Group: Individual Therapy  Rosalio LoudHOXIE, Makaylen Thieme 06/27/2017, 9:50 AM

## 2017-06-27 NOTE — Progress Notes (Signed)
Physical Therapy Weekly Progress Note  Patient Details  Name: HOLBERT CAPLES MRN: [id number] Date of Birth: 1995-09-30  Beginning of progress report period: June 20, 2017 End of progress report period: July 07, 2017  Today's Date: 06/27/2017 PT Individual Time: 1315-1430 PT Individual Time Calculation (min): 75 min   Patient has met 2 of 4 short term goals.  Pt is able to demonstrate rolling R/L maxA +1, supine <>sit maxA +2, dynamic sitting balance variable min guard>totalA however demonstrating improved use of UE and head positioning to obtain/regain balance during functional tasks. Pt additionally demonstrating improvements in UE strength and functional use including use of leg loops to assist with LE management. Transfers currently require maxA +2. Plan to train family in bed <>w/c and w/c <>car with transfer board, as well as hoyer use for w/c <>bed. Ongoing education with pt and family regarding SCI functional outcomes based on injury level. Will require extensive family education prior to d/c, and plan for family conference as well as w/c evaluation next week.   Patient continues to demonstrate the following deficits muscle weakness and muscle paralysis, decreased cardiorespiratoy endurance and decreased BP regulation, abnormal tone, unbalanced muscle activation and decreased coordination and decreased sitting balance, decreased postural control, decreased balance strategies and quadriplegia and therefore will continue to benefit from skilled PT intervention to increase functional independence with mobility.  Patient progressing slowly toward long term goals..  Continue plan of care.  PT Short Term Goals Week 2:  PT Short Term Goal 1 (Week 2): Pt will demonstrate rolling R/L maxA +1 PT Short Term Goal 1 - Progress (Week 2): Met PT Short Term Goal 2 (Week 2): Pt will demonstrate upright tolerance x45 min sitting in w/c without change in vitals PT Short Term Goal 2 - Progress  (Week 2): Met PT Short Term Goal 3 (Week 2): Pt will direct care for transfer w/c <>bed with minimal cueing. PT Short Term Goal 3 - Progress (Week 2): Progressing toward goal PT Short Term Goal 4 (Week 2): Pt will perform car transfer with maxA +2 PT Short Term Goal 4 - Progress (Week 2): Not met Week 3:  PT Short Term Goal 1 (Week 3): Pt will perform supine <>sit maxA +1 PT Short Term Goal 2 (Week 3): Pt will demonstrate static sitting balance with S x30 sec  PT Short Term Goal 3 (Week 3): Pt will trial power w/c with mod instructional cueing PT Short Term Goal 4 (Week 3): Pt will initiate car transfer training with therapy staff maxA +2  Skilled Therapeutic Interventions/Progress Updates: Pt initially missed 15 min PT d/t nursing staff meeting with pt and family regarding concerns related to bathing with OT vs nursing. Upon arrival pt in w/c, agreeable to treatment. Transferred w/c<>tilt table totalA in maximove. BLE TEDs, ace wraps donned; abdominal binder donned after first attempt at standing limited quickly by orthostatic hypotension. Performed 4 standing trials at 25>40 degrees based on tolerance; one achieving 40 degrees pt with sudden drop in BP and is symptomatic. Educated pt in role of standing for BP regulation, LE ROM, cardiovascular and pulmonary health, bowel/bladder function; pt was unable to recall benefits of standing from previous PT education. Pt attempted propelling w/c with BUE, RUE grip sock on hand for increased grip d/t brace, however difficulty likely due to positioning and fatigue. Returned to bed via [employer] d/t report of incontinent bowel movement. Rolling R/L with maxA with pt hooking UEs on therapist's UE, and LE positioned in hook lying.  Hygiene and clothing management totalA +2. Remained supine in bed at end of session, all needs in reach.      Therapy Documentation Precautions:  Precautions Precautions: Fall, Cervical Precaution Comments: [name] - no  ROM restrictions and WBAT 06/09/17;  [name] with wrist splint to be worn during WB only Required Braces or Orthoses: Cervical Brace, Other Brace/Splint Cervical Brace: Hard collar, Other (comment)(can be off in the bed; on for OOB) Other Brace/Splint: R wrist brace for WB 2/13 Restrictions Weight Bearing Restrictions: Yes RUE Weight Bearing: Non weight bearing LUE Weight Bearing: Weight bearing as tolerated Other Position/Activity Restrictions: pt with subluxation of Rt thumb early June  General: PT Amount of Missed Time (min): 15 Minutes PT Missed Treatment Reason: Nursing care(nursing meeting with family re: concerns)   See Function Navigator for Current Functional Status.  Therapy/Group: Individual Therapy  Luberta Mutter 06/27/2017, 2:36 PM

## 2017-06-27 NOTE — Progress Notes (Signed)
Occupational Therapy Session Note  Patient Details  Name: Keith [name] MRN: [id number] Date of Birth: 06/20/1995  Today's Date: 06/27/2017 OT Individual Time: 0700-0800 OT Individual Time Calculation (min): 60 min    Short Term Goals: Week 2:  OT Short Term Goal 1 (Week 2): Pt will sit EOB/EOM for 5 min with MAX A for sitting balance during functional activity OT Short Term Goal 2 (Week 2): Pt will groom at sink with min A using AE PRN OT Short Term Goal 3 (Week 2): Pt will wash UB with MOD HOH A and AE PRN OT Short Term Goal 4 (Week 2): Pt will roll B in bed during ADLs with MAX A of 1 caregiver  Skilled Therapeutic Interventions/Progress Updates:    OT intervention with focus on bed mobility, UB dressing tasks, sitting balance in long sitting during functional tasks.  Pt requires tot A for supine (HOB elevated to 40 degrees) to long sitting and min A/mod A for sitting balance when doffing/donning pullover shirt.  Pt requires tot A +2 for bed mobility and sitting EOB in preparation for slide board transfer to w/c with tot A + 2.  Pt's family initially concerned because nursing stated he was going to get a shower today since they didn't give him a night bath.  Discussed with sister via phone therapy goals and nursing does not determine when pt gets shower.  Showers for pt are scheduled and will be communicated at that time.  Pt's sister verbalized understanding. Pt remained in TIS w/c with father present.   Therapy Documentation Precautions:  Precautions Precautions: Fall, Cervical Precaution Comments: [name] - no ROM restrictions and WBAT 06/09/17;  [name] with wrist splint to be worn during WB only Required Braces or Orthoses: Cervical Brace, Other Brace/Splint Cervical Brace: Hard collar, Other (comment)(can be off in the bed; on for OOB) Other Brace/Splint: R wrist brace for WB 2/13 Restrictions Weight Bearing Restrictions: Yes RUE Weight Bearing: Non  weight bearing LUE Weight Bearing: Weight bearing as tolerated Other Position/Activity Restrictions: pt with subluxation of Rt thumb early June  Pain:  Pt denies pain but c/o soreness in shoulders from previous day activities/tasks  See Function Navigator for Current Functional Status.   Therapy/Group: Individual Therapy  [name], Annalena Piatt Chappell 06/27/2017, 9:55 AM

## 2017-06-28 ENCOUNTER — Inpatient Hospital Stay (HOSPITAL_COMMUNITY): Payer: BLUE CROSS/BLUE SHIELD

## 2017-06-28 ENCOUNTER — Inpatient Hospital Stay (HOSPITAL_COMMUNITY): Payer: BLUE CROSS/BLUE SHIELD | Admitting: Physical Therapy
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
## 2017-06-28 NOTE — Significant Event (Signed)
Attempt to give Tylenol or Ibuprofen for low grade temp. Refuses to take, has company. Mother requesting another bath, first one given at 10 am nurse tech, [phone]

## 2017-06-28 NOTE — Progress Notes (Signed)
Physical Therapy Session Note  Patient Details  Name: Keith [name] MRN: [id number] Date of Birth: Sep 15, 1995  Today's Date: 06/28/2017 PT Individual Time: 1015-1045 PT Individual Time Calculation (min): 30 min   Short Term Goals: Week 3:  PT Short Term Goal 1 (Week 3): Pt will perform supine <>sit maxA +1 PT Short Term Goal 2 (Week 3): Pt will demonstrate static sitting balance with S x30 sec  PT Short Term Goal 3 (Week 3): Pt will trial power w/c with mod instructional cueing PT Short Term Goal 4 (Week 3): Pt will initiate car transfer training with therapy staff maxA +2  Skilled Therapeutic Interventions/Progress Updates:   Pt in supine and agreeable to therapy, no c/o pain. Focused on assisting pt OOB this session to spend time in w/c for a few hours before next therapy session. Total assist to don [name], ace wraps on BLEs, leg loops, and c-collar prior to transferring to EOB, max assist +2. Verbal cues for leg loop use w/ UEs. Maintaining static sitting w/ max assist while 2nd helper set up w/c for transfer. Transferred to w/c via slide board w/ total assist x2. Pt requesting therapist to stretch UEs before end of session, provided passive ROM to both shoulders into horizontal abduction and flexion w/ gentle stretch within pain tolerance in 10-15 sec bouts. Ended session in w/c and in care of family, all needs met.   Therapy Documentation Precautions:  Precautions Precautions: Fall, Cervical Precaution Comments: [name] - no ROM restrictions and WBAT 06/09/17;  [name] with wrist splint to be worn during WB only Required Braces or Orthoses: Cervical Brace, Other Brace/Splint Cervical Brace: Hard collar, Other (comment)(can be off in the bed; on for OOB) Other Brace/Splint: R wrist brace for WB 2/13 Restrictions Weight Bearing Restrictions: Yes RUE Weight Bearing: Non weight bearing LUE Weight Bearing: Weight bearing as tolerated Other Position/Activity  Restrictions: pt with subluxation of Rt thumb early June  Pain: Pain Assessment Pain Assessment: Faces Pain Score: 3  Faces Pain Scale: Hurts a little bit 2nd Pain Site Pain Intervention(s): Medication (See eMAR);Back rub  See Function Navigator for Current Functional Status.   Therapy/Group: Individual Therapy  Dayami Taitt K Arnette 06/28/2017, 12:42 PM

## 2017-06-29 ENCOUNTER — Inpatient Hospital Stay (HOSPITAL_COMMUNITY): Payer: BLUE CROSS/BLUE SHIELD
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
## 2017-06-29 NOTE — Progress Notes (Signed)
Occupational Therapy Weekly Progress Note  Patient Details  Name: Keith [name] MRN: [id number] Date of Birth: 1995-09-11  Beginning of progress report period: June 20, 2017 End of progress report period: June 29, 2017   Patient has met 3 of 4 short term goals.  Pt progress with BADLs and sitting balance EOB or long sitting in bed has been steady during the past week.  Pt engaged in bathing at shower level requiring tot A + 2 with Maxi Lift for transfer to rolling shower chair.  Pt able to bathe chest and abdomen with bath mit donned on L hand.  Pt requires max A/tot A for sitting balance when seated in shower chair.  Pt able to sit in long sitting with max A for UB dressing tasks.  Pt with increased BUE AROM.  Pt requires tot A + 2 for rolling in bed for bathing and LB dressing tasks.    Patient continues to demonstrate the following deficits: muscle weakness and muscle paralysis, decreased cardiorespiratoy endurance, impaired timing and sequencing, abnormal tone, unbalanced muscle activation and decreased coordination and decreased sitting balance, decreased postural control and decreased balance strategies and therefore will continue to benefit from skilled OT intervention to enhance overall performance with BADL, iADL and Reduce care partner burden.  Patient progressing toward long term goals..  Continue plan of care.  OT Short Term Goals Week 2:  OT Short Term Goal 1 (Week 2): Pt will sit EOB/EOM for 5 min with MAX A for sitting balance during functional activity OT Short Term Goal 1 - Progress (Week 2): Met OT Short Term Goal 2 (Week 2): Pt will groom at sink with min A using AE PRN OT Short Term Goal 2 - Progress (Week 2): Met OT Short Term Goal 3 (Week 2): Pt will wash UB with MOD HOH A and AE PRN OT Short Term Goal 3 - Progress (Week 2): Met OT Short Term Goal 4 (Week 2): Pt will roll B in bed during ADLs with MAX A of 1 caregiver OT Short Term Goal 4 - Progress (Week 2):  Progressing toward goal Week 3:  OT Short Term Goal 1 (Week 3): Pt will roll B in bed during ADLs with MAX A of 1 caregiver OT Short Term Goal 2 (Week 3): Pt will sit in long sitting with mod A while engaged in UB bathing/dressing tasks OT Short Term Goal 3 (Week 3): Pt will perform slide board transfer with max A + 2       Therapy Documentation Precautions:  Precautions Precautions: Fall, Cervical Precaution Comments: [name] - no ROM restrictions and WBAT 06/09/17;  [name] with wrist splint to be worn during WB only Required Braces or Orthoses: Cervical Brace, Other Brace/Splint Cervical Brace: Hard collar, Other (comment)(can be off in the bed; on for OOB) Other Brace/Splint: R wrist brace for WB 2/13 Restrictions Weight Bearing Restrictions: Yes RUE Weight Bearing: Non weight bearing LUE Weight Bearing: Weight bearing as tolerated Other Position/Activity Restrictions: pt with subluxation of Rt thumb early June     See Function Navigator for Current Functional Status.     Leotis Shames [facility] 06/29/2017, 8:35 AM

## 2017-06-29 NOTE — Progress Notes (Signed)
Occupational Therapy Session Note  Patient Details  Name: Keith [name] MRN: [id number] Date of Birth: 04/17/1996  Today's Date: 06/29/2017 OT Individual Time: [id number] OT Individual Time Calculation (min): 58 min    Short Term Goals: Week 1:  OT Short Term Goal 1 (Week 1): Pt will sit EOB/EOM for 5 min with MAX A for sitting balance during functional activity OT Short Term Goal 1 - Progress (Week 1): Progressing toward goal OT Short Term Goal 2 (Week 1): Pt will groom at sink with min A using AE PRN OT Short Term Goal 2 - Progress (Week 1): Progressing toward goal OT Short Term Goal 3 (Week 1): Pt will wash UB with MOD HOH A and AE PRN OT Short Term Goal 3 - Progress (Week 1): Progressing toward goal OT Short Term Goal 4 (Week 1): Pt will roll B in bed during ADLs with MAX A of 1 caregiver OT Short Term Goal 4 - Progress (Week 1): Progressing toward goal  Skilled Therapeutic Interventions/Progress Updates:    1:1. Pt and family assisting with dressing this session. PT able to scratch top of head today with own volition. OT dons teds, ace wraps, pants and shoes in bed and pt rolls B with MAX A as +2 advances pants past hips. Pt completes supine>sitting EOB with total A of 2 and slide board transfer MAX A of 2 EOB>w/c. Seated in w/c OT selects modular hose system C clamp system and positions tablet/phone to improve access/reach to technology in order for pt to engage in preferred activities on tablet. Gave family handout with website should patient decide he would like to purchase for home. Discussed voice to text programs available for computer should patient want to post on social media or email physicians. Exited session wit pt seated in w/c and call light in reach Therapy Documentation Precautions:  Precautions Precautions: Fall, Cervical Precaution Comments: [name] - no ROM restrictions and WBAT 06/09/17;  [name] with wrist splint to be worn during WB  only Required Braces or Orthoses: Cervical Brace, Other Brace/Splint Cervical Brace: Hard collar, Other (comment)(can be off in the bed; on for OOB) Other Brace/Splint: R wrist brace for WB 2/13 Restrictions Weight Bearing Restrictions: Yes RUE Weight Bearing: Non weight bearing LUE Weight Bearing: Weight bearing as tolerated Other Position/Activity Restrictions: pt with subluxation of Rt thumb early June   See Function Navigator for Current Functional Status.   Therapy/Group: Individual Therapy  Shon HaleStephanie M Jenni Thew 06/29/2017, 10:43 AM

## 2017-06-30 ENCOUNTER — Inpatient Hospital Stay (HOSPITAL_COMMUNITY): Payer: BLUE CROSS/BLUE SHIELD

## 2017-06-30 ENCOUNTER — Inpatient Hospital Stay (HOSPITAL_COMMUNITY): Payer: BLUE CROSS/BLUE SHIELD | Admitting: Physical Therapy

## 2017-06-30 ENCOUNTER — Inpatient Hospital Stay (HOSPITAL_COMMUNITY): Payer: BLUE CROSS/BLUE SHIELD | Admitting: *Deleted

## 2017-06-30 MED ORDER — BACLOFEN 5 MG HALF TABLET
5.0000 mg | ORAL_TABLET | Freq: Three times a day (TID) | ORAL | Status: DC | PRN
  Administered 2017-06-30 – 2017-07-02 (×4): 5 mg via ORAL
  Filled 2017-06-30 (×5): qty 1

## 2017-06-30 MED ORDER — BISACODYL 10 MG RE SUPP
10.0000 mg | RECTAL | Status: DC
  Administered 2017-06-30 – 2017-07-10 (×7): 10 mg via RECTAL
  Filled 2017-06-30 (×12): qty 1

## 2017-06-30 MED ORDER — POLYETHYLENE GLYCOL 3350 17 G PO PACK
17.0000 g | PACK | Freq: Every day | ORAL | Status: DC
  Administered 2017-07-03 – 2017-07-11 (×5): 17 g via ORAL
  Filled 2017-06-30 (×11): qty 1
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
## 2017-06-30 NOTE — Progress Notes (Signed)
Occupational Therapy Note  Patient Details  Name: [name] MRN: [id number] Date of Birth: 02/25/1996  Today's Date: 06/30/2017 OT Individual Time: 1400-1430 OT Individual Time Calculation (min): 30 min   Pt denies pain Individual Therapy  Pt resting in power w/c upon arrival.  Pt able to manipulate w/c controls to place in proper position to propel to gym.  Pt engaged in BUE theract with plastic ball with focus on shoulder flexion and adduction to hold ball between both hands and pushing up.  Pt also engaged in hitting ball back to therapist.  Pt c/o he could feel his shoulders getting tired from the activity.  Pt returned to room and remained in w/c with friend present.    [name], Mickeal Daws [facility]ell 06/30/2017, 2:58 PM

## 2017-06-30 NOTE — Progress Notes (Signed)
Occupational Therapy Session Note  Patient Details  Name: Keith [name] MRN: [id number] Date of Birth: 02/04/1996  Today's Date: 06/30/2017 OT Individual Time: 0700-0800 OT Individual Time Calculation (min): 60 min    Short Term Goals: Week 3:  OT Short Term Goal 1 (Week 3): Pt will roll B in bed during ADLs with MAX A of 1 caregiver OT Short Term Goal 2 (Week 3): Pt will sit in long sitting with mod A while engaged in UB bathing/dressing tasks OT Short Term Goal 3 (Week 3): Pt will perform slide board transfer with max A + 2   Skilled Therapeutic Interventions/Progress Updates:    Pt resting in bed upon arrival with Mom and Dad present.  OT intervention with focus on bed mobility, UB dressing, sitting balance, functional transfers, BUE AROM, education, and safety awareness to increase independence with BADLs. Pt requires tot A +2 for rolling in bed.  Pt able to hook elbow on bed rails to assist with maintaining sidelying.  [name], Ace wraps, and shorts donned with tot A +2. Pt required tot A for moving from [facility] elevated at 80 degrees to sitting in long sitting with BUE support.  Pt required max A for unsupported sitting to assist with doffing/donning pullover shirt.  Pt able to thread BUE into shirt sleeves but required assistance with pulling over elbows and over head.  Pt able to pull shirt over trunk in front but required assistance with pulling over back.  Pt performed slide board transfers with tot A + 2 to w/c.  Pt stated he felt a "little woozy" and abdominal binder donned.  BP at 98/61.  Pt remained in w/c with family present.  Instructions to notify RN if symptoms worsened.   Therapy Documentation Precautions:  Precautions Precautions: Fall, Cervical Precaution Comments: [name] - no ROM restrictions and WBAT 06/09/17;  [name] with wrist splint to be worn during WB only Required Braces or Orthoses: Cervical Brace, Other Brace/Splint Cervical Brace: Hard  collar, Other (comment)(can be off in the bed; on for OOB) Other Brace/Splint: R wrist brace for WB 2/13 Restrictions Weight Bearing Restrictions: Yes RUE Weight Bearing: Non weight bearing LUE Weight Bearing: Weight bearing as tolerated Other Position/Activity Restrictions: pt with subluxation of Rt thumb early June    Pain:  Pt denies pain  See Function Navigator for Current Functional Status.   Therapy/Group: Individual Therapy  [name], Britiany Silbernagel Chappell 06/30/2017, 8:37 AM

## 2017-06-30 NOTE — Progress Notes (Signed)
Patient refuses his bowel program and was educated.

## 2017-06-30 NOTE — Plan of Care (Signed)
PT unable to void 

## 2017-06-30 NOTE — Progress Notes (Signed)
Occupational Therapy Note  Patient Details  Name: Keith [name] MRN: [id number] Date of Birth: 12/21/1995  Today's Date: 06/30/2017 OT Individual Time: 1100-1200 OT Individual Time Calculation (min): 60 min   Pt denies pain Individual Therapy  OT intervention with focus on w/c mobility with power chair in hallway and ADL apartment and self feeding with U-cuff with adaptive utensil.  Several options explored and pt agreed that U-cuff with elastic back works best for self feeding.  Spoon bent at ~75 degree angle to facilitate pt able to scoop pudding from container and bring to mouth.  Pt drove power chair to room and backed next to bed.  Min verbal cues for speed setting for chair in various environmental settings.  [name] with questions about w/c accessible vans; side entrance vs rear entrance. Pt remained in w/c with family present.    [name], Meagan Spease [facility] 06/30/2017, 12:09 PM

## 2017-06-30 NOTE — Progress Notes (Signed)
Physical Therapy Session Note  Patient Details  Name: Keith [name] MRN: [id number] Date of Birth: 09/17/1995  Today's Date: 06/30/2017 PT Individual Time: 0900-1010 PT Individual Time Calculation (min): 70 min   Short Term Goals: Week 3:  PT Short Term Goal 1 (Week 3): Pt will perform supine <>sit maxA +1 PT Short Term Goal 2 (Week 3): Pt will demonstrate static sitting balance with S x30 sec  PT Short Term Goal 3 (Week 3): Pt will trial power w/c with mod instructional cueing PT Short Term Goal 4 (Week 3): Pt will initiate car transfer training with therapy staff maxA +2  Skilled Therapeutic Interventions/Progress Updates: Pt received seated in manual chair; denies pain and agreeable to treatment. Attempted w/c propulsion in manual chair, grip sock used on RUE brace to increase grip; difficulty propelling and ultimately transported totalA to gym. Transfer w/c >mat table>power w/c with maxA +2 and transfer board; pt continues to assist using LUE as able but insufficient push through LUE to reduce level of caregiver assist. Demonstrated various power chair features to patient including tilt, recline, seat elevator (discussed this is an optional feature not covered by insurance), different drive speeds. Pt propelled w/c with RUE on joystick through doorways and wide hallways, weaving through cones with minimal cueing for technique and chair clearance due to large back end. Discussed at length with pt's father regarding follow up therapy vs nursing, home health vs outpatient, HEP, equipment including w/c options, options for vehicle and home access with power chair. Father asked about LE activation at this time; educated that no muscle activation had been noted below the level of his injury and while prognosis is variable and hard to predict, the current plan is to continue progressing with plans based on pt's current functional level. Demonstrated to patient and father how to use tilt, recline  features related to comfort and pressure relief; pt able to control joystick with moderate accuracy, however unable to reach control panel buttons without accidentally pushing joystick d/t placement and decreased fine motor control. Would recommend field goal joystick for increased control, and dispersed placement of  Pt remained seated in w/c at end of session, all needs in reach.      Therapy Documentation Precautions:  Precautions Precautions: Fall, Cervical Precaution Comments: [name] - no ROM restrictions and WBAT 06/09/17;  [name] with wrist splint to be worn during WB only Required Braces or Orthoses: Cervical Brace, Other Brace/Splint Cervical Brace: Hard collar, Other (comment)(can be off in the bed; on for OOB) Other Brace/Splint: R wrist brace for WB 2/13 Restrictions Weight Bearing Restrictions: Yes RUE Weight Bearing: Non weight bearing LUE Weight Bearing: Weight bearing as tolerated Other Position/Activity Restrictions: pt with subluxation of Rt thumb early June  Pain: Pain Assessment Pain Assessment: No/denies pain   See Function Navigator for Current Functional Status.   Therapy/Group: Individual Therapy  Keith [name] 06/30/2017, 10:20 AM

## 2017-06-30 NOTE — Progress Notes (Signed)
Patient's mother refuses all HS meds except for Gabapentin. She said her son have been prescribed meds that he doesn't need. Both patient and family were educated about meds. Patient's mother also threatened to sue, because according to her staffs (MDs, Therapists, Nurses and NTs) are not doing their jobs. This RN tries to talk to her and was able to explained to her how certain thing are done and she expressed understanding. We continue to monitor.

## 2017-07-01 ENCOUNTER — Inpatient Hospital Stay (HOSPITAL_COMMUNITY): Payer: BLUE CROSS/BLUE SHIELD | Admitting: Occupational Therapy

## 2017-07-01 ENCOUNTER — Inpatient Hospital Stay (HOSPITAL_COMMUNITY): Payer: BLUE CROSS/BLUE SHIELD | Admitting: Physical Therapy

## 2017-07-01 LAB — BASIC METABOLIC PANEL
Anion gap: 11 (ref 5–15)
BUN: 16 mg/dL (ref 6–20)
CHLORIDE: 105 mmol/L (ref 101–111)
CO2: 21 mmol/L — AB (ref 22–32)
CREATININE: 0.59 mg/dL — AB (ref 0.61–1.24)
Calcium: 9.9 mg/dL (ref 8.9–10.3)
GFR calc Af Amer: >60 mL/min (ref >60)
GFR calc non Af Amer: >60 mL/min (ref >60)
Glucose, Bld: 94 mg/dL (ref 65–99)
Potassium: 4.6 mmol/L (ref 3.5–5.1)
Sodium: 137 mmol/L (ref 135–145)

## 2017-07-01 LAB — CBC
HEMATOCRIT: 32.4 % — AB (ref 39.0–52.0)
HEMOGLOBIN: 10.4 g/dL — AB (ref 13.0–17.0)
MCH: 27.9 pg (ref 26.0–34.0)
MCHC: 32.1 g/dL (ref 30.0–36.0)
MCV: 86.9 fL (ref 78.0–100.0)
Platelets: 335 10*3/uL (ref 150–400)
RBC: 3.73 MIL/uL — ABNORMAL LOW (ref 4.22–5.81)
RDW: 13.3 % (ref 11.5–15.5)
WBC: 6.9 10*3/uL (ref 4.0–10.5)

## 2017-07-01 NOTE — Progress Notes (Addendum)
Physical Therapy Session Note  Patient Details  Name: Keith [name] MRN: [id number] Date of Birth: 07/12/1995  Today's Date: 07/01/2017 PT Individual Time: 1100-1200 and 1430-1515 PT Individual Time Calculation (min): 60 min and 45 min (total 105 min)   Short Term Goals: Week 3:  PT Short Term Goal 1 (Week 3): Pt will perform supine <>sit maxA +1 PT Short Term Goal 2 (Week 3): Pt will demonstrate static sitting balance with S x30 sec  PT Short Term Goal 3 (Week 3): Pt will trial power w/c with mod instructional cueing PT Short Term Goal 4 (Week 3): Pt will initiate car transfer training with therapy staff maxA +2  Skilled Therapeutic Interventions/Progress Updates: Tx 1: Pt received seated in w/c, denies pain and agreeable to treatment. Pt drove power chair throughout session with S and min cues for technique and safety. Transferred w/c <>tilt table totalA in maximove. Tolerated standing x4 trials at 30-40 degrees; tolerates approximately 5-7 min at a time before pt reports symptomatic hypotension, assessed at 80's/40's. Returned to baseline BP 142/86 following short rest break in supine. Abdominal binder, TEDs, and ace wraps donned to improve BP regulation. Navigated power chair around unit with min cues for practice navigating in new spaces. Remained seated in w/c at end of session, all needs in reach.   Tx 2: Pt received seated in w/c, denies pain and agreeable to treatment. W/c mobility with S throughout session in power chair. Performed car transfer with lateral scoot and maxA; +2 for stabilizing equipment. Pt with incontinent bowel movement during transfer. Returned to bed maxA +2 slideboard transfer. Sit >supine totalA +2. Therapist, PT student and mother performed peri hygiene and clothing management with totalA. Pt assists with rolling R/L using UE to hook on therapist's arm and bedrail. Remained supine in bed with handoff to NTs to perform I&O catheter. Remained supine in bed, all  needs in reach.      Therapy Documentation Precautions:  Precautions Precautions: Fall, Cervical Precaution Comments: [name] - no ROM restrictions and WBAT 06/09/17;  [name] with wrist splint to be worn during WB only Required Braces or Orthoses: Cervical Brace, Other Brace/Splint Cervical Brace: Hard collar, Other (comment)(can be off in the bed; on for OOB) Other Brace/Splint: R wrist brace for WB 2/13 Restrictions Weight Bearing Restrictions: Yes RUE Weight Bearing: Non weight bearing LUE Weight Bearing: Weight bearing as tolerated Other Position/Activity Restrictions: pt with subluxation of Rt thumb early June   General: PT Missed time: 15 min d/t nursing care   See Function Navigator for Current Functional Status.   Therapy/Group: Individual Therapy  Vista Lawmanlizabeth J Tygielski 07/01/2017, 12:41 PM

## 2017-07-01 NOTE — Progress Notes (Signed)
Occupational Therapy Session Note  Patient Details  Name: Keith [name] MRN: [id number] Date of Birth: 06/12/1995  Today's Date: 07/01/2017 OT Individual Time: 0900-1030 OT Individual Time Calculation (min): 90 min    Short Term Goals: Week 3:  OT Short Term Goal 1 (Week 3): Pt will roll B in bed during ADLs with MAX A of 1 caregiver OT Short Term Goal 2 (Week 3): Pt will sit in long sitting with mod A while engaged in UB bathing/dressing tasks OT Short Term Goal 3 (Week 3): Pt will perform slide board transfer with max A + 2   Skilled Therapeutic Interventions/Progress Updates:    OT intervention with focus on sitting balance, activity tolerance, bed mobility, family education, BUE functional use for bathing/dressing tasks, and slide board transfers to increase independence with BADLs.  After [name] donned and Ace wraps applied pt transferred to rolling shower chair with Maxi Lift for shower.  Pt used BUE with bath mit to bathe abdomin, chest and lower UE.  Pt required tot A for thoroughness to complete all bathing tasks.  Pt c/o ringing in ears but reduced with continued activity.  Pt returned to room and Mom and Dad assisted with dressing tasks.  Mom had to take rest breaks X 3 when assisting.  Pt requires tot A + 2 for LB dressing tasks and supne>long sitting in bed to work on UB dressing tasks.  Pt requires max A/tot A for sitting balance in long sitting.  Pt donned pullover shirt with mod A.  Pt performed slide board transfer to w/c with tot A + 2.  Pt required tot A for repositioning in w/c.  Pt remained in w/c with family present.   Therapy Documentation Precautions:  Precautions Precautions: Fall, Cervical Precaution Comments: [name] - no ROM restrictions and WBAT 06/09/17;  [name] with wrist splint to be worn during WB only Required Braces or Orthoses: Cervical Brace, Other Brace/Splint Cervical Brace: Hard collar, Other (comment)(can be off in the bed;  on for OOB) Other Brace/Splint: R wrist brace for WB 2/13 Restrictions Weight Bearing Restrictions: Yes RUE Weight Bearing: Non weight bearing LUE Weight Bearing: Weight bearing as tolerated Other Position/Activity Restrictions: pt with subluxation of Rt thumb early June    Pain:  Pt denies pain  See Function Navigator for Current Functional Status.   Therapy/Group: Individual Therapy  [name], [name] 07/01/2017, 12:22 PM
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
## 2017-07-02 ENCOUNTER — Inpatient Hospital Stay (HOSPITAL_COMMUNITY): Payer: BLUE CROSS/BLUE SHIELD | Admitting: Physical Therapy

## 2017-07-02 ENCOUNTER — Inpatient Hospital Stay (HOSPITAL_COMMUNITY): Payer: BLUE CROSS/BLUE SHIELD | Admitting: Occupational Therapy

## 2017-07-02 NOTE — Plan of Care (Signed)
  RH Wheelchair Mobility LTG Patient will propel w/c in controlled environment (PT) Description LTG: Patient will propel wheelchair in controlled environment, # of feet with assist (PT) 07/02/2017 1652 by Vista Lawmanygielski, Liana Camerer J, PT Flowsheets Taken 07/02/2017 1652  LTG: Pt will propel w/c in controlled environ  assist needed: 5 - Supervision/set up (power chair) LTG: Propel w/c distance in controlled environment 150' Note Goal adjusted d/t change in w/c use

## 2017-07-02 NOTE — Progress Notes (Signed)
RN educated father and patient on proper clean technique for I&O caths. Father demonstrated clean technique to RN with minimal assistance from RN. Father stated that the I&O cath schedule will be managed at home with a white board and done q 4 hrs. RN discussed for education to other members of the family with proper technique to assure cathing will be done correctly. RN will continue to educate on bowel and bladder management and care.

## 2017-07-02 NOTE — Progress Notes (Signed)
Father demonstrated proper technique to RN while giving patient Lovenox injection.

## 2017-07-02 NOTE — Progress Notes (Signed)
RN educated proper clean technique to girlfriend and sister. Quita SkyeGF, Simone, demonstrated proper clean technique with minimal assistance from RN when cathing pt. Will continue to educate family on bowel and bladder management.

## 2017-07-02 NOTE — Progress Notes (Signed)
RN educated mother on proper clean technique for I&O caths. Mother demonstrated clean technique to RN with minimal assistance/guidance from patients father and Girl friend. RN to continue to educate bowel and bladder management with family and caregivers.

## 2017-07-02 NOTE — Progress Notes (Addendum)
Physical Therapy Session Note  Patient Details  Name: Keith [name] MRN: [id number] Date of Birth: [birth year]  Today's Date: 07/02/2017 PT Individual Time: 1000-1130 1400-1445 PT Individual Time Calculation (min): 90 min +61mn total 1379ms  Short Term Goals: Week 2:  PT Short Term Goal 1 (Week 2): Pt will demonstrate rolling R/L maxA +1 PT Short Term Goal 1 - Progress (Week 2): Met PT Short Term Goal 2 (Week 2): Pt will demonstrate upright tolerance x45 min sitting in w/c without change in vitals PT Short Term Goal 2 - Progress (Week 2): Met PT Short Term Goal 3 (Week 2): Pt will direct care for transfer w/c <>bed with minimal cueing. PT Short Term Goal 3 - Progress (Week 2): Progressing toward goal PT Short Term Goal 4 (Week 2): Pt will perform car transfer with maxA +2 PT Short Term Goal 4 - Progress (Week 2): Not met Week 3:  PT Short Term Goal 1 (Week 3): Pt will perform supine <>sit maxA +1 PT Short Term Goal 2 (Week 3): Pt will demonstrate static sitting balance with S x30 sec  PT Short Term Goal 3 (Week 3): Pt will trial power w/c with mod instructional cueing PT Short Term Goal 4 (Week 3): Pt will initiate car transfer training with therapy staff maxA +2  Skilled Therapeutic Interventions/Progress Updates:    Tx 1: Pt greeted supine in bed, agreeable to therapy, grandmother present. Shoes and cervical collar donned in supine with totalA. Pt transitions supine to sit with maxA +2, uses leg loops to assist with pulling legs off bed and to pull trunk forward. Pt transfers bed>power WC with maxA +2 using sliding board, pt uses power WC with supervision around obstacles in hallway, requires intermittent vcing to be mindful of back of chair. Pt completes 10 minutes on arm bike using BUE level 7, supervision to maintain trunk balance with 1 LOS to L side with modA to correct. Utilizing pressure mapping to educate pt on pressure relief, pt able to recall frequency and duration of  pressure relief recommendations, used visual aid of pressure map to determine optimal pressure relief technique. Pt drives W/C to therapy gym, transfers WC<>mat using sliding board with maxA +2, utilizes UE to help push/pull. Sitting edge of mat, pt performs 5 x sit-ups supported on therapy ball using head lean and arms to help facilitate anterior lean. Pt leans to either side on elbow and maintains propped on elbow position for 3-5 seconds, reports anterior R shoulder pain with propping on R elbow. Pt performs 1x 10 scapular rows with 3 sec hold, 1 x 20 scapular punches against level 1 therapy band, elbows supported in full extension with decreased coordination of movement noted with RUE. At end of session, pt returned to room in power W/C, grandma present, denies any needs at this time.   Tx 2: Pt greeted sitting upright in power W/C, handed off from nursing staff. Pt drives power W/C to therapy gym with decreased vcing for steering. Discussed use of hoyer lift at home with family with pt agreement, transfers W/C<>mat with totalA by hoyer lift. On mat, PROM to BLE performed for hip flexion and ER, ankle DF, pt reports family is  Continuing with daily PROM routine. Pt transitions supine to long sit with totalA, able to maintain sitting balance in long sit for approximately 3 minutes with min guard for safety. Practices utilizing leg loops to assume figure four and circle sitting positions for additional hip ER stretch, Pt able to  maintain sitting balance in each position for up to 5 minutes. Pt demonstrates improved ability to use arms and head to maintain sitting balance and find equilibrium. At end of session, pt returns to room in power [facility], denies any needs at this time.  Therapy Documentation Precautions:  Precautions Precautions: Fall, Cervical Precaution Comments: [name] - no ROM restrictions and WBAT 06/09/17;  [name] with wrist splint to be worn during WB only Required Braces  or Orthoses: Cervical Brace, Other Brace/Splint Cervical Brace: Hard collar, Other (comment)(can be off in the bed; on for OOB) Other Brace/Splint: R wrist brace for WB 2/13 Restrictions Weight Bearing Restrictions: Yes RUE Weight Bearing: Non weight bearing LUE Weight Bearing: Weight bearing as tolerated Other Position/Activity Restrictions: pt with subluxation of Rt thumb early June  Pain: Pain Assessment Pain Assessment: No/denies pain   See Function Navigator for Current Functional Status.   Therapy/Group: Individual Therapy  Salvatore Decent 07/02/2017, 12:18 PM

## 2017-07-02 NOTE — Progress Notes (Signed)
Occupational Therapy Session Note  Patient Details  Name: Keith [name] MRN: [id number] Date of Birth: April 10, 1996  Today's Date: 07/02/2017 OT Individual Time: 3335-4562 OT Individual Time Calculation (min): 60 min    Short Term Goals: Week 1:  OT Short Term Goal 1 (Week 1): Pt will sit EOB/EOM for 5 min with MAX A for sitting balance during functional activity OT Short Term Goal 1 - Progress (Week 1): Progressing toward goal OT Short Term Goal 2 (Week 1): Pt will groom at sink with min A using AE PRN OT Short Term Goal 2 - Progress (Week 1): Progressing toward goal OT Short Term Goal 3 (Week 1): Pt will wash UB with MOD HOH A and AE PRN OT Short Term Goal 3 - Progress (Week 1): Progressing toward goal OT Short Term Goal 4 (Week 1): Pt will roll B in bed during ADLs with MAX A of 1 caregiver OT Short Term Goal 4 - Progress (Week 1): Progressing toward goal Week 2:  OT Short Term Goal 1 (Week 2): Pt will sit EOB/EOM for 5 min with MAX A for sitting balance during functional activity OT Short Term Goal 1 - Progress (Week 2): Met OT Short Term Goal 2 (Week 2): Pt will groom at sink with min A using AE PRN OT Short Term Goal 2 - Progress (Week 2): Met OT Short Term Goal 3 (Week 2): Pt will wash UB with MOD HOH A and AE PRN OT Short Term Goal 3 - Progress (Week 2): Met OT Short Term Goal 4 (Week 2): Pt will roll B in bed during ADLs with MAX A of 1 caregiver OT Short Term Goal 4 - Progress (Week 2): Progressing toward goal Week 3:  OT Short Term Goal 1 (Week 3): Pt will roll B in bed during ADLs with MAX A of 1 caregiver OT Short Term Goal 2 (Week 3): Pt will sit in long sitting with mod A while engaged in UB bathing/dressing tasks OT Short Term Goal 3 (Week 3): Pt will perform slide board transfer with max A + 2   Skilled Therapeutic Interventions/Progress Updates:    Pt seen this session with a focus on achieving skills to meet first 2 STGs of bed mobility and long sitting with ADL  training.  Pt received in bed and therapist worked on B hip stretches and educated pt's dad on how to do them. Suggested his dad do at least 5 min of hip stretches before pt works on self care skills to loosen his joints and to reduce spasms.  Pt did well rolling L/R in bed with mod A with therapist flexing one knee and moving knee across body.  Cued pt to use his head to lead movement patterns.  [name] and ACE wraps donned.  Pt then worked on long sitting with mod A and half circle sits when donning pants over feet with max A to guide fabric over feet guiding his hands.  Leg loops donned and pt practiced looping hands to pull up from reclined on bed at 60 degree angle to leaning forward over hips.  Donned shirt with max A to manipulate fabric and pull up arms. Pt with limited shoulder ROM, educated dad on stretches he can do with his son for his shoulders.  Pt worked on active tricep ext with a/arom.  Also worked on active trunk extension. In long sitting, pt is not able to achieve fully upright sit so worked on movements of scapular retraction  and lifting his chin to work towards that movement pattern.  Pt tolerated activity well with no leg spasms.  Pt resting in bed with RN in room with him.  Therapy Documentation Precautions:  Precautions Precautions: Fall, Cervical Precaution Comments: [name] - no ROM restrictions and WBAT 06/09/17;  [name] with wrist splint to be worn during WB only Required Braces or Orthoses: Cervical Brace, Other Brace/Splint Cervical Brace: Hard collar, Other (comment)(can be off in the bed; on for OOB) Other Brace/Splint: R wrist brace for WB 2/13 Restrictions Weight Bearing Restrictions: Yes RUE Weight Bearing: Non weight bearing LUE Weight Bearing: Weight bearing as tolerated Other Position/Activity Restrictions: pt with subluxation of Rt thumb early June    Pain: Pain Assessment Pain Assessment: No/denies pain ADL:    See Function  Navigator for Current Functional Status.   Therapy/Group: Individual Therapy  [place] 07/02/2017, 10:02 AM
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
## 2017-07-03 ENCOUNTER — Inpatient Hospital Stay (HOSPITAL_COMMUNITY): Payer: BLUE CROSS/BLUE SHIELD | Admitting: Occupational Therapy

## 2017-07-03 ENCOUNTER — Inpatient Hospital Stay (HOSPITAL_COMMUNITY): Payer: BLUE CROSS/BLUE SHIELD | Admitting: Physical Therapy

## 2017-07-03 ENCOUNTER — Inpatient Hospital Stay (HOSPITAL_COMMUNITY): Payer: BLUE CROSS/BLUE SHIELD

## 2017-07-03 NOTE — Progress Notes (Signed)
Physical Therapy Weekly Progress Note  Patient Details  Name: Keith [name] MRN: [id number] Date of Birth: 1995/11/27  Beginning of progress report period: June 26, 2017 End of progress report period: July 03, 2017  Today's Date: 07/03/2017 PT Individual Time: 0800-0930 1000-1040 PT Individual Time Calculation (min): 90 min and 40 mins total 182mns   Patient has met 2 of 4 short term goals.  Pt demonstrates rolling R/L maxA +1, supine <>sit maxA +2, dynamic sitting balance requires min guard to totalA continuing to demonstrate improved use of UE and head positioning to maintain balance during functional tasks, able to maintain static sitting balance approximately 8-10secs in short sitting, up to 2 minutes in long and circle sitting.Transfers bed<>WC and WC<> simulated car using slideboard with maxA +2. Plan to train family in bed <>w/c and w/c <>car with transfer board, as well as hoyer use for w/c <>bed. Ongoing pt and family education on functional outcomes associated with SCI, family conference held today to discuss needs upon discharge, power W/C evaluation conducted today.   Patient continues to demonstrate the following deficits muscle weakness and muscle paralysis, abnormal tone and unbalanced muscle activation and decreased sitting balance, decreased postural control, decreased balance strategies and tetraplegia and therefore will continue to benefit from skilled PT intervention to increase functional independence with mobility.  Patient progressing toward long term goals..  Continue plan of care.  PT Short Term Goals Week 4:  PT Short Term Goal 1 (Week 4): =LTG d/t ELOS  Skilled Therapeutic Interventions/Progress Updates:    Tx 1: Pt is greeted supine n bed, mom feeding him breakfast, agreeable to therapy. [name] and ace wraps donned BLE with totalA, pt transitions supine>sit with maxA +2 for sliding legs off bed, pt able to hook arms and pull to sit with A for balance.  Transfers bed<>WC<>mat using sliding board with maxA +2, able to utilize BUE to help push from surface. Sitting edge of mat, pt able to maintain sitting balance for approximately 8 seconds, maxA +1 to maintain sitting balance for approximately 15 minutes during power mobility evaluation, BUE/BLE strength and ROM assessed as part of W/C evaluation. At end of session, pt returned to room in power W/C, denies any needs at this time.   Tx 2: Pt participated in family ed conference including mom and dad, grandma, 2 sisters, OT, PT, RN, MD, and CSW. Discussed plans for home environment, transfers, caregiver management, and follow-up.  Therapy Documentation Precautions:  Precautions Precautions: Fall, Cervical Precaution Comments: [name] - no ROM restrictions and WBAT 06/09/17;  [name] with wrist splint to be worn during WB only Required Braces or Orthoses: Cervical Brace, Other Brace/Splint Cervical Brace: Hard collar, Other (comment)(can be off in the bed; on for OOB) Other Brace/Splint: R wrist brace for WB 2/13 Restrictions Weight Bearing Restrictions: Yes RUE Weight Bearing: Non weight bearing LUE Weight Bearing: Weight bearing as tolerated Other Position/Activity Restrictions: pt with subluxation of Rt thumb early June    See Function Navigator for Current Functional Status.  Therapy/Group: Individual Therapy  MSalvatore Decent2/21/2019, 12:03 PM

## 2017-07-03 NOTE — Progress Notes (Addendum)
Physical Therapy Note  Patient Details  Name: Keith [name] MRN: [id number] Date of Birth: 06/19/1995 Today's Date: 07/03/2017  1300-1345, 45 min individual tx Pain: none per pt  Pt describes feeling "kind of shaky". Misty StanleyStacey, RN entered, and stated pt had just been cath'd, had eaten lunch.  Pt willing to participate.    tx focused on therapeutic activity in supine: rolling partial side lying> full side lying after set up of bil LEs in slight flexion and donning non slip socks for traction.    PT donned leg loops, which pt used for lateral movement bil LEs iwht assistance during supine> sit EOB.  Pt sat EOB x 5 minutes focusing on static balance, with brief moments finding balance point.  No c/o dizziness, without abdominal binder.  Pt left resting in bed with HOB at 30 degrees, with needs at hand and alarm set.  Mother in room, sleeping heavily.  See function navigator for current status.  Tyden Kann 07/03/2017, 12:25 PM

## 2017-07-03 NOTE — Progress Notes (Signed)
Occupational Therapy Session Note  Patient Details  Name: VASILIOS OTTAWAY MRN: [id number] Date of Birth: 05/16/1995  Today's Date: 07/03/2017 OT Individual Time: 1100-1130 OT Individual Time Calculation (min): 30 min    Short Term Goals: Week 2:  OT Short Term Goal 1 (Week 2): Pt will sit EOB/EOM for 5 min with MAX A for sitting balance during functional activity OT Short Term Goal 1 - Progress (Week 2): Met OT Short Term Goal 2 (Week 2): Pt will groom at sink with min A using AE PRN OT Short Term Goal 2 - Progress (Week 2): Met OT Short Term Goal 3 (Week 2): Pt will wash UB with MOD HOH A and AE PRN OT Short Term Goal 3 - Progress (Week 2): Met OT Short Term Goal 4 (Week 2): Pt will roll B in bed during ADLs with MAX A of 1 caregiver OT Short Term Goal 4 - Progress (Week 2): Progressing toward goal Week 3:  OT Short Term Goal 1 (Week 3): Pt will roll B in bed during ADLs with MAX A of 1 caregiver OT Short Term Goal 2 (Week 3): Pt will sit in long sitting with mod A while engaged in UB bathing/dressing tasks OT Short Term Goal 3 (Week 3): Pt will perform slide board transfer with max A + 2   Skilled Therapeutic Interventions/Progress Updates:    1:1 Focus on positioning in power w/c for cathing to be completed with family and nursing present. Pt able to position self in reclined position with leg strap doffed and easy acceptable clothing. Then demonstrated and discussed manual hoyer lift for d/c home and then demonstrated with pt in sling to transfer into the bed with therapist performing.   Therapy Documentation Precautions:  Precautions Precautions: Fall, Cervical Precaution Comments: [name] - no ROM restrictions and WBAT 06/09/17;  [name] with wrist splint to be worn during WB only Required Braces or Orthoses: Cervical Brace, Other Brace/Splint Cervical Brace: Hard collar, Other (comment)(can be off in the bed; on for OOB) Other Brace/Splint: R wrist brace  for WB 2/13 Restrictions Weight Bearing Restrictions: Yes RUE Weight Bearing: Non weight bearing LUE Weight Bearing: Weight bearing as tolerated Other Position/Activity Restrictions: pt with subluxation of Rt thumb early June  General:   Vital Signs: Therapy Vitals Temp: 98.3 F (36.8 C) Temp Source: Oral Pulse Rate: 85 Resp: 16 BP: (!) 104/57 Patient Position (if appropriate): Sitting Oxygen Therapy SpO2: 99 % O2 Device: Not Delivered Pain:  no c/o pain  See Function Navigator for Current Functional Status.   Therapy/Group: Individual Therapy  Willeen Cass [facility] 07/03/2017, 4:20 PM

## 2017-07-03 NOTE — Progress Notes (Signed)
RN observed patient's mother performed in & out cath with proper technique with minimal guidance from the RN at 0240. Pt tolerated the procedure well, denies any discomfort.

## 2017-07-03 NOTE — Progress Notes (Signed)
Physical Therapy Session Note  Patient Details  Name: Keith [name] MRN: [id number] Date of Birth: 10/24/1995  Today's Date: 07/03/2017 PT Individual Time: [id number] PT Individual Time Calculation (min): 40 min   Skilled Therapeutic Interventions/Progress Updates:  Pt received in bed with mother present. No c/o pain reported. Pt's mother donned c-collar. Pt agreeable to transferring to power w/c and pt's mother positioned hoyer sling - pt requires total assist for rolling L<>R for sling placement. Therapist and pt provided cuing for appropriate sling management (pt requires encouragement to instruct his mother but is then able to do so). Pt's mother, therapist, and tech assisted pt bed>w/c as pt requires assistance to transition to a more upright position 2/2 limited positioning in manual hoyer sling. Pt's mother reports she is considering buying an Sport and exercise psychologistelectric hoyer lift because of this. Once in w/c pt requires cuing and assistance to don seat belt & chest strap. Pt able to instruct therapist in managing w/c buttons. Pt practiced driving power chair throughout unit, between cones, and over ramp to simulate home environment and community environment. Therapist provides education to position w/c upright when negotiating ramp & pt able to return demonstrate. At end of session pt left sitting in power w/c in room with mother present to supervise.   Therapy Documentation Precautions:  Precautions Precautions: Fall, Cervical Precaution Comments: [name] - no ROM restrictions and WBAT 06/09/17;  [name] with wrist splint to be worn during WB only Required Braces or Orthoses: Cervical Brace, Other Brace/Splint Cervical Brace: Hard collar, Other (comment)(can be off in the bed; on for OOB) Other Brace/Splint: R wrist brace for WB 2/13 Restrictions Weight Bearing Restrictions: Yes RUE Weight Bearing: Non weight bearing LUE Weight Bearing: Weight bearing as tolerated Other  Position/Activity Restrictions: pt with subluxation of Rt thumb early June    See Function Navigator for Current Functional Status.   Therapy/Group: Individual Therapy  Sandi MariscalVictoria M Latrell Potempa 07/03/2017, 3:05 PM
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
## 2017-07-04 ENCOUNTER — Inpatient Hospital Stay (HOSPITAL_COMMUNITY): Payer: BLUE CROSS/BLUE SHIELD | Admitting: Physical Therapy

## 2017-07-04 ENCOUNTER — Inpatient Hospital Stay (HOSPITAL_COMMUNITY): Payer: BLUE CROSS/BLUE SHIELD | Admitting: Occupational Therapy

## 2017-07-04 NOTE — Progress Notes (Signed)
Occupational Therapy Session Note  Patient Details  Name: Keith [name] MRN: [id number] Date of Birth: 02-May-1996  Today's Date: 07/04/2017 OT Individual Time: 1300-1345 OT Individual Time Calculation (min): 45 min    Short Term Goals: Week 2:  OT Short Term Goal 1 (Week 2): Pt will sit EOB/EOM for 5 min with MAX A for sitting balance during functional activity OT Short Term Goal 1 - Progress (Week 2): Met OT Short Term Goal 2 (Week 2): Pt will groom at sink with min A using AE PRN OT Short Term Goal 2 - Progress (Week 2): Met OT Short Term Goal 3 (Week 2): Pt will wash UB with MOD HOH A and AE PRN OT Short Term Goal 3 - Progress (Week 2): Met OT Short Term Goal 4 (Week 2): Pt will roll B in bed during ADLs with MAX A of 1 caregiver OT Short Term Goal 4 - Progress (Week 2): Progressing toward goal Week 3:  OT Short Term Goal 1 (Week 3): Pt will roll B in bed during ADLs with MAX A of 1 caregiver OT Short Term Goal 2 (Week 3): Pt will sit in long sitting with mod A while engaged in UB bathing/dressing tasks OT Short Term Goal 3 (Week 3): Pt will perform slide board transfer with max A + 2   Skilled Therapeutic Interventions/Progress Updates:    1:1 Therapeutic activity . Transtioned to the tilt table via maxi move. Performed upright positioning on tilt table working up to 40 degrees for cardio pulmonary endurance, weight bearing, activity tolerance. Pt's BP remained around 89/35 to 106/50s in upright position. While in upright position on table engaged bilateral UEs in boxing game on the Wii (hand controls reinforced in hands with co band.   Once transitioned via lift back into power chair- discussed and trialed use of pencil and stylus with u cuff to be able to access and press buttons on power w/c controls.   Left up in power chair in prep for next session.   Therapy Documentation Precautions:  Precautions Precautions: Fall, Cervical Precaution Comments: [name] -  no ROM restrictions and WBAT 06/09/17 Required Braces or Orthoses: Cervical Brace Cervical Brace: Hard collar, Other (comment) Other Brace/Splint: R wrist brace d/c'd 07/04/17, C collar can be off in bed Restrictions Weight Bearing Restrictions: Yes RUE Weight Bearing: Weight bearing as tolerated LUE Weight Bearing: Weight bearing as tolerated Other Position/Activity Restrictions: pt with R thumb subluxation in early January   Pain:  no c/o pain  See Function Navigator for Current Functional Status.   Therapy/Group: Individual Therapy  Willeen Cass [facility] 07/04/2017, 2:53 PM

## 2017-07-04 NOTE — Plan of Care (Signed)
  SCI BLADDER ELIMINATION RH STG MANAGE BLADDER WITH EQUIPMENT WITH ASSISTANCE Description STG Manage Bladder With Equipment With max Assistance  07/04/2017 1129 - Progressing by Thurston HoleBennett, Tyrique Sporn, RN   RH SKIN INTEGRITY RH STG MAINTAIN SKIN INTEGRITY WITH ASSISTANCE Description STG Maintain Skin Integrity With Assistance. Max  07/04/2017 1129 - Progressing by Thurston HoleBennett, Donnica Jarnagin, RN   RH PAIN MANAGEMENT RH STG PAIN MANAGED AT OR BELOW PT'S PAIN GOAL Description Less than 3  07/04/2017 1129 - Progressing by Thurston HoleBennett, Raelene Trew, RN

## 2017-07-04 NOTE — Progress Notes (Signed)
Social Work Patient ID: Keith [name], male   DOB: 03/06/1996, 22 y.o.   MRN: [id number]   Patient/Family Conference  Patient/family in attendance: pt, parents, grandmother and two sisters  Staff in attendance:  Dr. Riley KillSwartz; Roney MansJennifer Smith, OT;  Grace IsaacLizzie Tygeilski, PT; Chana Bodeeborah Sharp, RN  Main focus: Education of SCI and pts current function and projected longer term recovery  Synopsis of information shared: Dr. Riley KillSwartz and RN presented medical/ clinical picture and therapists able to present their clinical education as well.  Social Work review of DME, follow up services and community resources.  Barriers/concerns expressed by patient and family:  Family very focused on DME and HH support  Patient/family response: Pt did not engage with team and denied any questions/ concerns when questioned.  Family appreciative of information provided.  Follow-up/action plans:  Continue "hands on education next week.  Helen Cuff, LCSW

## 2017-07-04 NOTE — Progress Notes (Signed)
Physical Therapy Note  Patient Details  Name: Keith [name] MRN: [id number] Date of Birth: 01/03/1996 Today's Date: 07/04/2017    Time: 1400-1445 45 minutes  1:1 No c/o pain.  Pt able to drive power w/c throughout unit with supervision for set up for transfers, mod I for controlled environment.  Transfers with maxi move.  Session focused on sitting balance and tolerance edge of mat x 25 minutes with wt shifts laterally and anterior/posterior with pt requiring from min-max A depending on positioning and grading of movements. Pt improved grading with repetition, continues to require min/mod A for static sitting. Pt left in bed with needs at hand, family present   [facility],Adelynn Gipe 07/04/2017, 2:54 PM

## 2017-07-04 NOTE — Plan of Care (Signed)
  SCI BOWEL ELIMINATION RH STG MANAGE BOWEL WITH ASSISTANCE Description STG Manage Bowel with Assistance. Max  07/04/2017 0431 - Progressing by Mason JimKooy, Elley Harp, RN RH STG SCI MANAGE BOWEL PROGRAM W/ASSIST OR AS APPROPRIATE Description STG SCI Manage bowel program w/assist or as appropriate. Max  07/04/2017 0431 - Progressing by Mason JimKooy, Miamor Ayler, RN   SCI BLADDER ELIMINATION RH STG MANAGE BLADDER WITH EQUIPMENT WITH ASSISTANCE Description STG Manage Bladder With Equipment With max Assistance  07/04/2017 0431 - Progressing by Mason JimKooy, Cisco Kindt, RN   RH SKIN INTEGRITY RH STG SKIN FREE OF INFECTION/BREAKDOWN Description Free of breakdown and infection with max assist  07/04/2017 0431 - Progressing by Mason JimKooy, Marrion Finan, RN RH STG MAINTAIN SKIN INTEGRITY WITH ASSISTANCE Description STG Maintain Skin Integrity With Assistance. Max  07/04/2017 0431 - Progressing by Mason JimKooy, Celsa Nordahl, RN   RH PAIN MANAGEMENT RH STG PAIN MANAGED AT OR BELOW PT'S PAIN GOAL Description Less than 3  07/04/2017 0431 - Progressing by Mason JimKooy, Talynn Lebon, RN
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
## 2017-07-04 NOTE — Progress Notes (Signed)
Occupational Therapy Session Note  Patient Details  Name: Keith [name] MRN: [id number] Date of Birth: 01/18/1996  Today's Date: 07/04/2017 OT Individual Time: [id number] OT Individual Time Calculation (min): 60 min  (missed 15 min at start of session for nursing care)  Short Term Goals: Week 3:  OT Short Term Goal 1 (Week 3): Pt will roll B in bed during ADLs with MAX A of 1 caregiver OT Short Term Goal 2 (Week 3): Pt will sit in long sitting with mod A while engaged in UB bathing/dressing tasks OT Short Term Goal 3 (Week 3): Pt will perform slide board transfer with max A + 2   Skilled Therapeutic Interventions/Progress Updates:    At start of session, pt's father working with RN on cathing his son.  Pt was ready for OT to begin after 15 min.  His father stated that he worked on LE ROM earlier this morning and pt's hips were more flexible today.  Donned [name] and ACE wraps and leg loops.  Pt changed tshirt with max A using hand over hand guiding using tenodesis method for pulling clothing over arms.  He then practiced using leg loops to pull self forward from semi reclined position to forward flexion to work on reaching towards toes.  Worked on half circle sit pulling legs in with loops with max A and then pushing legs back out to straight position with back of hand.  The majority of the session was focused on pt education with pt and father on UE exercises to do to facilitate more active movement and strength in UB.   -a/arom of B shoulders into flexion (pt has approx. 80-90 active flexion) -resisted extension of B arms by pushing elbow back towards bed -resisted B elbow flexion with wrist weight -L elbow resisted extension with mod resistance, R with min resistance to facilitate strength -resisted wrist extension -education on tenodesis and importance of not over stretching fingers into extension -facilitated tenodesis grasp with picking up and releasing wash cloth.  Provided  dad with hand out.     Therapy Documentation Precautions:  Precautions Precautions: Fall, Cervical Precaution Comments: [name] - no ROM restrictions and WBAT 06/09/17;  Required Braces or Orthoses: Cervical Brace, Other Brace/Splint Cervical Brace: Hard collar, Other (comment)(can be off in the bed; on for OOB) Other Brace/Splint: R wrist brace d/c'd 07/04/17 Restrictions Weight Bearing Restrictions: Yes RUE Weight Bearing: Weight bearing as tolerated LUE Weight Bearing: Weight bearing as tolerated   Pain: Pain Assessment Pain Score: 0-No pain  ADL:   See Function Navigator for Current Functional Status.   Therapy/Group: Individual Therapy  Keith [name] 07/04/2017, 12:26 PM
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
## 2017-07-05 ENCOUNTER — Inpatient Hospital Stay (HOSPITAL_COMMUNITY): Payer: BLUE CROSS/BLUE SHIELD
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
## 2017-07-05 NOTE — Progress Notes (Signed)
Occupational Therapy Session Note  Patient Details  Name: Keith [name] MRN: [id number] Date of Birth: 07/01/1995  Today's Date: 07/05/2017 OT Individual Time: 1400-1515 OT Individual Time Calculation (min): 75 min    Short Term Goals: Week 3:  OT Short Term Goal 1 (Week 3): Pt will roll B in bed during ADLs with MAX A of 1 caregiver OT Short Term Goal 2 (Week 3): Pt will sit in long sitting with mod A while engaged in UB bathing/dressing tasks OT Short Term Goal 3 (Week 3): Pt will perform slide board transfer with max A + 2   Skilled Therapeutic Interventions/Progress Updates:    1:1. Pt and sister present along with nephew. OT dons teds and ace wraps as well as shoes with total A. Pt supine>sitting EOB with +2 assist. Sister educated on threading sleeves into long sleeve sweater prior to putting overhead while pt seated EOB with MAX A for sitting balance and 1 LOB anteriorly/R with MOD A to recover. Pt slide board transfer EOB>PWC with total +2 and VC for hand placement. Pt maneuvers w/c to/from all tx destinations and uses U-cuff and stylus to push buttons for tilting features. Pt plays game of volleyball with nephew punching beachball across table in alternating pattern for coordination and reaction timing and improving cardio endurance 6x25 volleys. Pt completes towel glides 1x1 min against manual resistance shouler flex/ext, elbow flex/ext, circles in B directions and horizontal ab/adduction to improve BUE strength required for BADLs. Pt given "homework assignment" to look up youtube videos and type in search engine "tenodesis grasp" to see what other people with SCI can do functionally using grasp. Exited session with pt seated in w/c, lap and chest belt in place and all needs met with family present.  Therapy Documentation Precautions:  Precautions Precautions: Fall, Cervical Precaution Comments: [name] - no ROM restrictions and WBAT 06/09/17 Required Braces or  Orthoses: Cervical Brace Cervical Brace: Hard collar, Other (comment) Other Brace/Splint: R wrist brace d/c'd 07/04/17, C collar can be off in bed Restrictions Weight Bearing Restrictions: Yes RUE Weight Bearing: Non weight bearing LUE Weight Bearing: Weight bearing as tolerated Other Position/Activity Restrictions: pt with R thumb subluxation in early January  See Function Navigator for Current Functional Status.   Therapy/Group: Individual Therapy  [name] 07/05/2017, 3:12 PM

## 2017-07-05 NOTE — Progress Notes (Signed)
Pt's sister completed am cath this morning. She attempted to use coloplast sample and used correct technique. She advanced the catheter all the way but got no urine return. Pt repositioned to allow gravity to aid in urine flow. No urine return present. RN was at bedside to help problem solve. Pt became uncomfortable so RN switched to hospital kit and Pt's sister got urine return on first attempt. Will probably need to consider a different product for home use.

## 2017-07-06 ENCOUNTER — Inpatient Hospital Stay (HOSPITAL_COMMUNITY): Payer: BLUE CROSS/BLUE SHIELD

## 2017-07-06 MED ORDER — ACETAMINOPHEN 325 MG PO TABS
650.0000 mg | ORAL_TABLET | Freq: Four times a day (QID) | ORAL | Status: DC | PRN
  Administered 2017-07-06: 650 mg via ORAL

## 2017-07-06 MED ORDER — MUPIROCIN CALCIUM 2 % EX CREA
TOPICAL_CREAM | Freq: Two times a day (BID) | CUTANEOUS | Status: DC
  Administered 2017-07-06 – 2017-07-08 (×6): via TOPICAL
  Filled 2017-07-06: qty 15

## 2017-07-06 NOTE — Progress Notes (Signed)
This nurse attempted to give patient scheduled suppository multiple times between 2000-0100. Patient at first wanted to wait until back in bed and visitors had left. Patient then wanted to wait until after family had given bath. Patients friend came to desk @0110  and stated that patient had a large bowel movement during bath and did not require suppository. Education given regarding bowel program. Will continue to monitor.

## 2017-07-06 NOTE — Progress Notes (Signed)
This nurse attempted to educate patient regarding need for consistent time for bowel program. Patient stated "I don't know what time, I'll just have to wait 'til I get home". Nurse educated patient about need to begin consistent bowel program asap. Will continue to monitor.

## 2017-07-06 NOTE — Progress Notes (Signed)
Physical Therapy Session Note  Patient Details  Name: Keith [name] MRN: [id number] Date of Birth: 05/06/1996  Today's Date: 07/06/2017 PT Individual Time: 1300-1359 PT Individual Time Calculation (min): 59 min   Short Term Goals: Week 4:  PT Short Term Goal 1 (Week 4): =LTG d/t ELOS  Skilled Therapeutic Interventions/Progress Updates:    Pt supine in bed upon PT arrival agreeable to therapy and denies pain. C collar, Teds and ace wraps donned BLE with totalA, pt transitions supine>sit with maxA +2 for sliding legs off bed, pt able to hook arms and pull to sit with A for balance. Transfers bed<>WC<>mat using sliding board with maxA +2, verbal/tactile cues for B UE use. Worked on static seated balance while sitting edge of mat, pt able to maintain sitting balance 3-5 second bouts, otherwise requiring up to maxA +1 to maintain sitting balance. Pt performed leans on elbow each side x 2, requiring max assist to push back up into sitting.Pt transferred sitting edge of mat to long sit with totalA, able to maintain sitting balance in long sit for approximately 8 minutes with min guard for safety. While in long sitting pt worked on UE strength to perform shoulder abduction and bicep curls with 2# wrist weight. Pt with verbal cues to utilize leg loops in order to move LEs when transitioning long sit>seated edge of mat. Pt transferred back to w/c and left seated in TIS with family present at end of session.        Therapy Documentation Precautions:  Precautions Precautions: Fall, Cervical Precaution Comments: [name] - no ROM restrictions and WBAT 06/09/17 Required Braces or Orthoses: Cervical Brace Cervical Brace: Hard collar, Other (comment) Other Brace/Splint: R wrist brace d/c'd 07/04/17, C collar can be off in bed Restrictions Weight Bearing Restrictions: Yes RUE Weight Bearing: Non weight bearing LUE Weight Bearing: Weight bearing as tolerated Other Position/Activity  Restrictions: pt with R thumb subluxation in early January   See Function Navigator for Current Functional Status.   Therapy/Group: Individual Therapy  Cresenciano GenreEmily van Schagen, PT, DPT 07/06/2017, 7:59 AM
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
## 2017-07-07 ENCOUNTER — Inpatient Hospital Stay (HOSPITAL_COMMUNITY): Payer: BLUE CROSS/BLUE SHIELD | Admitting: Physical Therapy

## 2017-07-07 ENCOUNTER — Encounter (HOSPITAL_COMMUNITY): Payer: BLUE CROSS/BLUE SHIELD | Admitting: Psychology

## 2017-07-07 ENCOUNTER — Inpatient Hospital Stay (HOSPITAL_COMMUNITY): Payer: BLUE CROSS/BLUE SHIELD

## 2017-07-07 DIAGNOSIS — L738 Other specified follicular disorders: Secondary | ICD-10-CM

## 2017-07-07 MED ORDER — DOXYCYCLINE HYCLATE 100 MG PO TABS
100.0000 mg | ORAL_TABLET | Freq: Two times a day (BID) | ORAL | Status: DC
  Administered 2017-07-07 – 2017-07-11 (×9): 100 mg via ORAL
  Filled 2017-07-07 (×9): qty 1
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
## 2017-07-07 NOTE — Progress Notes (Addendum)
Occupational Therapy Note  Patient Details  Name: Keith SellCameron R XXXDillard MRN: [id number] Date of Birth: 01/29/1996  Today's Date: 07/07/2017 OT Individual Time: 1345-1415 OT Individual Time Calculation (min): 30 min   Pt denies pain Individual Therapy  Pt resting in w/c upon arrival.  OT intervention with focus on BUE AROM and therex with 2.5# cuffs.  Pt completed 3 sets of 8 with focus on shoulder and elbow flexion.  Pt c/o increased malaise and n/v.  Pt stated he felt the same way all day after taking new medication.  Pt did not c/o lightheadedness or dizziness.  Pt stated he felt like he was going to throw up.  Pt returned to room and transferred back to bed (slide board with +2 assistance). Pt required tot A + 2 for repositioning in bed.  Pt remained in bed with NT present.  Pt missed 15 mins skilled OT services.   [name], Desarea [place] [facility] 07/07/2017, 2:32 PM
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
## 2017-07-07 NOTE — Plan of Care (Signed)
  SCI BLADDER ELIMINATION RH STG MANAGE BLADDER WITH EQUIPMENT WITH ASSISTANCE Description STG Manage Bladder With Equipment With max Assistance  07/07/2017 1500 - Progressing by Thurston HoleBennett, Lunetta Marina, RN   RH SKIN INTEGRITY RH STG MAINTAIN SKIN INTEGRITY WITH ASSISTANCE Description STG Maintain Skin Integrity With Assistance. Max  07/07/2017 1500 - Progressing by Thurston HoleBennett, Kamaron Deskins, RN   RH PAIN MANAGEMENT RH STG PAIN MANAGED AT OR BELOW PT'S PAIN GOAL Description Less than 3  07/07/2017 1500 - Progressing by Thurston HoleBennett, Heatherly Stenner, RN

## 2017-07-07 NOTE — Progress Notes (Addendum)
Physical Therapy Session Note  Patient Details  Name: Keith [name] MRN: [id number] Date of Birth: November 06, 1995  Today's Date: 07/07/2017 PT Individual Time: 1423-9532 1115-1200 1300-1345 PT Individual Time Calculation (min): 35 min + 45 min + 45 total 125 mins  Short Term Goals: Week 3:  PT Short Term Goal 1 (Week 3): Pt will perform supine <>sit maxA +1 PT Short Term Goal 1 - Progress (Week 3): Progressing toward goal PT Short Term Goal 2 (Week 3): Pt will demonstrate static sitting balance with S x30 sec  PT Short Term Goal 2 - Progress (Week 3): Progressing toward goal PT Short Term Goal 3 (Week 3): Pt will trial power w/c with mod instructional cueing PT Short Term Goal 3 - Progress (Week 3): Met PT Short Term Goal 4 (Week 3): Pt will initiate car transfer training with therapy staff maxA +2 PT Short Term Goal 4 - Progress (Week 3): Met  Skilled Therapeutic Interventions/Progress Updates:    Tx 1: Pt received supine in bed from nursing staff after catheter care and changing brief, agreeable to therapy Therapist provdied handout for mom to don ted hose and ACE wraps in bed, needs reinforcement of ace wrap pattern, mom also donned leg loops with verbal cues. Blisters present on medial right lower leg, RN suggested placing xeroform impregnated gauze over blisters before donning ted hose.  Discussed hoyer vs sliding board transfer with mom, mom mentions desire to get electric hoyer lift for home use. Mom placed sling under pt and used mechanical hoyer with verbal cues for placement and technique, transfer bed>power W/C. At end of session, pt left upright in power W/C mom and dad present. Denies any needs at this time.   Tx 2: Pt greeted upright in power W/C, friend present in room but not throughout session, agreeable to therapy, reports some neck pain and friend dons cervical collar with verbal cues. Pt drives power mobility to therapy gym with minimal verbal cues for steering, transfers  W/C<>mat using sliding board max A +2. Doffed R ace wraps and ted hose to inspect blisters, look worse vs earlier this morning. Spoke with PA about blisters and he suggests leaving leaving off ted hose and ace wraps to reduce compression, educated Cam on monitoring BP more closely without ted hose and ace wraps. Rolls supine> prone on wedge with totalA, pt unable to fully support upper body weight in elbows, maintains prone on wedge for approxiamtely 5 minutes, discussed benefits of position for maintaining B hip flexor length. Passive quad stretch provided to BLE, pt reports decreased feeling LLE compared to RLE, increased tone noted in LLE. Pt transitions prone> supine>sit with totalA, transfers mat>W/C as above. At end of session, pt returned to room in power W/C, nursing staff notified as pt requests to return to bed for catheterization, denies any needs at this time.   Tx 3: Pt sitting upright in power W/C, friends present, agreeable to therapy but reports fatigue. Pt drives power W/C to apartment gym, attempts to reach light switch, kitchen cabinets, peep hole, and thermostat from chair height, shoulder abduction measured 110 degrees reaching to overhead cabinet. Unable to successfully use light switch and peep hole, able to reach kitchen cabinet but has difficulty opening it. Transfers from loaner power W/C<>mat<>power W/C with seat elevation mode, utilized seat elevation mode in apartment kitchen with improved success of accessing light switch, kitchen cabinets and peep hole. At end of session, pt handed off to OT in therapy gym, denies any needs  at this time.   Therapy Documentation Precautions:  Precautions Precautions: Fall, Cervical Precaution Comments: [name] - no ROM restrictions and WBAT 06/09/17 Required Braces or Orthoses: Cervical Brace Cervical Brace: Hard collar, Other (comment) Other Brace/Splint: R wrist brace d/c'd 07/04/17, C collar can be off in  bed Restrictions Weight Bearing Restrictions: Yes RUE Weight Bearing: Non weight bearing LUE Weight Bearing: Weight bearing as tolerated Other Position/Activity Restrictions: pt with R thumb subluxation in early January General: PT Amount of Missed Time (min): 10 Minutes PT Missed Treatment Reason: Nursing care   See Function Navigator for Current Functional Status.   Therapy/Group: Individual Therapy  Salvatore Decent 07/07/2017, 10:50 AM

## 2017-07-07 NOTE — Progress Notes (Signed)
Occupational Therapy Session Note  Patient Details  Name: Keith [name] MRN: [id number] Date of Birth: 06/11/1995  Today's Date: 07/07/2017 OT Individual Time: 0930-1030 OT Individual Time Calculation (min): 60 min    Short Term Goals: Week 3:  OT Short Term Goal 1 (Week 3): Pt will roll B in bed during ADLs with MAX A of 1 caregiver OT Short Term Goal 2 (Week 3): Pt will sit in long sitting with mod A while engaged in UB bathing/dressing tasks OT Short Term Goal 3 (Week 3): Pt will perform slide board transfer with max A + 2   Skilled Therapeutic Interventions/Progress Updates:    Pt resting in w/c upon arrival.  OT intervention with focus on sitting balance and BUE functional use and strengthening.  Pt performed slide board transfer with tot A + 2 to mat and engaged in sitting balance tasks while tossing and catching large ball.  Pt required max/tot A for sitting balance when engaged in tasks.  Pt returned to w/c and transitioned to table for BUE reaching activities with focus on reaching and grasping bean bags.  Pt also engaged in tossing/catching large ball while seated in w/c with focus on continued BUE strengthening.  Pt returned to room and remained in w/c with family present.   Therapy Documentation Precautions:  Precautions Precautions: Fall, Cervical Precaution Comments: [name] - no ROM restrictions and WBAT 06/09/17 Required Braces or Orthoses: Cervical Brace Cervical Brace: Hard collar, Other (comment) Other Brace/Splint: R wrist brace d/c'd 07/04/17, C collar can be off in bed Restrictions Weight Bearing Restrictions: Yes RUE Weight Bearing: Non weight bearing LUE Weight Bearing: Weight bearing as tolerated Other Position/Activity Restrictions: pt with R thumb subluxation in early January Pain: Pt denies pain  See Function Navigator for Current Functional Status.   Therapy/Group: Individual Therapy  [name], Khriz Liddy Chappell 07/07/2017, 12:12 PM

## 2017-07-08 ENCOUNTER — Inpatient Hospital Stay (HOSPITAL_COMMUNITY): Payer: BLUE CROSS/BLUE SHIELD | Admitting: *Deleted

## 2017-07-08 ENCOUNTER — Inpatient Hospital Stay (HOSPITAL_COMMUNITY): Payer: BLUE CROSS/BLUE SHIELD | Admitting: Physical Therapy

## 2017-07-08 ENCOUNTER — Inpatient Hospital Stay (HOSPITAL_COMMUNITY): Payer: BLUE CROSS/BLUE SHIELD

## 2017-07-08 LAB — BASIC METABOLIC PANEL
Anion gap: 10 (ref 5–15)
BUN: 10 mg/dL (ref 6–20)
CALCIUM: 9.9 mg/dL (ref 8.9–10.3)
CO2: 21 mmol/L — ABNORMAL LOW (ref 22–32)
Chloride: 108 mmol/L (ref 101–111)
Creatinine, Ser: 0.54 mg/dL — ABNORMAL LOW (ref 0.61–1.24)
Glucose, Bld: 104 mg/dL — ABNORMAL HIGH (ref 65–99)
POTASSIUM: 4.2 mmol/L (ref 3.5–5.1)
SODIUM: 139 mmol/L (ref 135–145)

## 2017-07-08 LAB — CBC
HCT: 34.1 % — ABNORMAL LOW (ref 39.0–52.0)
Hemoglobin: 11.1 g/dL — ABNORMAL LOW (ref 13.0–17.0)
MCH: 28.4 pg (ref 26.0–34.0)
MCHC: 32.6 g/dL (ref 30.0–36.0)
MCV: 87.2 fL (ref 78.0–100.0)
PLATELETS: 313 10*3/uL (ref 150–400)
RBC: 3.91 MIL/uL — AB (ref 4.22–5.81)
RDW: 13.5 % (ref 11.5–15.5)
WBC: 10 10*3/uL (ref 4.0–10.5)

## 2017-07-08 NOTE — Progress Notes (Addendum)
Physical Therapy Session Note  Patient Details  Name: Keith [name] MRN: [id number] Date of Birth: 10/29/1995  Today's Date: 07/08/2017  PT Individual Time: 1100-1200 1330-1500 PT Individual Time Calculation (min): 60 min and 90 min 150min  total  Skilled Therapeutic Interventions/Progress Updates:    Tx 1: Pt received sitting upright in power W/C, mom present and discussed D/C plans including ramp for house vs accessible apartment, W/C van vs car, and HHPT scheduling. Pt drove power W/C to therapy gym with supervision, loses balance to R side when trying to undo seatbelt, attempts to push up from mat using RUE however unable to complete. Transfers W/C<>mat using sliding board with max A +2, pt maintains sitting balance on mat for approximately 1 minute with supervision, maxA to recover LOB. Performs 1 x 12 BUE bicep curls, shoulder shrugs, scapular rows, serratus punches with 1.5# cuff weights about wrists. Assumed propped position with unilateral UE extended behind for shoulder and elbow extension ROM, attempts to prop using BUE however pt unable to hold weight of upper body. Transitions short sit<>long sit with totalA, maintains sitting balance for approximately 8 minutes while performing reaching tasks to number spots, loses balance to L with maxA to recover. Engages in beach ball hitting task with supervision for balance, able to maintain more upright posture with repetition. Pt transfers back to W/C as above, drives power W/C to room, discussed D/C plan updates with mom with nothing finalized yet, mom is expecting call from accessible apartment this afternoon and plans to get accessible [name] tomorrow, will follow up with case manager about HHPT and equipment delivery.   Tx 2: Pt received supine in bed after nap, agreeable to therapy session. Pt transitions supine to sit with totalA +2 with mom as second helper. Transfers bed<>power W/C using slide board with maxA +2, mom as second helper. Pt  drives power W/C to therapy gym, transfers W/C<>mat using slide board with maxA +2 for balance, emphasizing pt using BUE and head hips relationship to scoot laterally, maxA for balance to prevent anterior LOB, frequent repositioning of hands on dycem, re-enforcing hand positioning in fist to preserve tenodesis. Utilized teach back method of education on transfers, skin care and pressure relief, autonomic dysreflexia symptoms and management, and discussed other concerns with returning to home. Pt encouraged to go out into community with family/friends as much as possible with adaptations when necessary, may benefit from session with RT to discuss activity modifications and recreation. At end of session, pt returned to supine in bed as above d/t BM and need for catheterization. Educational handouts provided and pt denies any needs a this time  Therapy Documentation Precautions:  Precautions Precautions: Fall, Cervical Precaution Comments: [name] - no ROM restrictions and WBAT 06/09/17 Required Braces or Orthoses: Cervical Brace Cervical Brace: Hard collar, Other (comment) Other Brace/Splint: R wrist brace d/c'd 07/04/17, C collar can be off in bed Restrictions Weight Bearing Restrictions: Yes RUE Weight Bearing: Non weight bearing LUE Weight Bearing: Weight bearing as tolerated Other Position/Activity Restrictions: pt with R thumb subluxation in early January   See Function Navigator for Current Functional Status.   Therapy/Group: Individual Therapy  Theodosia QuayMorgan Urban [employer] 07/08/2017, 12:00 PM
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
## 2017-07-08 NOTE — Progress Notes (Signed)
Occupational Therapy Note  Patient Details  Name: Keith SellCameron R XXXDillard MRN: [id number] Date of Birth: 03/24/1996  Today's Date: 07/08/2017 OT Individual Time: 1030-1100 OT Individual Time Calculation (min): 30 min   Pt denies pain Individual Therapy  OT intervention with focus on BUE therex with 2/5# cuffs for biceps/triceps exercises and shoulder flexion/extension exercises to increase functional use of BUE for BADLs. .  Pt also engaged in "hitting" beach bal with 2.5# cuffs on wrists.   [name], Lesslie Mossa [facility] 07/08/2017, 12:15 PM

## 2017-07-08 NOTE — Progress Notes (Signed)
Occupational Therapy Weekly Progress Note  Patient Details  Name: Keith [name] MRN: [id number] Date of Birth: [birth year]  Beginning of progress report period: June 29, 2017 End of progress report period: July 08, 2017  Patient has met 3 of 3 short term goals.  Pt continues to make steady progress with BUE functional use, bed mobility, functional transfers with slide board, and sitting balance in long sitting.  Pt requires mod A for dynamic sitting balance in long sitting to don shirt.  Pt initiates rolling in bed and completes task with max A.  Pt's family has been present for therapy sessions and have participated in basic BADLs at bed level. Family conference did take place on the 21st with pt and his family to discuss d/c plans and review recommendations. Focus on therapy this week is to  Conclude hands on training with family. FAmily is insistant on showering patient at home - not recommended at this time and only in a tilt in space shower chair with lift.  Patient continues to demonstrate the following deficits: muscle weakness, decreased cardiorespiratoy endurance, impaired timing and sequencing, abnormal tone and unbalanced muscle activation and decreased sitting balance, decreased postural control and decreased balance strategies and therefore will continue to benefit from skilled OT intervention to enhance overall performance with BADL and Reduce care partner burden.  Patient progressing toward long term goals..  Continue plan of care.  OT Short Term Goals Week 3:  OT Short Term Goal 1 (Week 3): Pt will roll B in bed during ADLs with MAX A of 1 caregiver OT Short Term Goal 1 - Progress (Week 3): Met OT Short Term Goal 2 (Week 3): Pt will sit in long sitting with mod A while engaged in UB bathing/dressing tasks OT Short Term Goal 2 - Progress (Week 3): Met OT Short Term Goal 3 (Week 3): Pt will perform slide board transfer with max A + 2  OT Short Term Goal 3 - Progress  (Week 3): Met Week 4:  OT Short Term Goal 1 (Week 4): STG=LTG secondary to ELOS     Therapy Documentation Precautions:  Precautions Precautions: Fall, Cervical Precaution Comments: [name] - no ROM restrictions and WBAT 06/09/17 Required Braces or Orthoses: Cervical Brace Cervical Brace: Hard collar, Other (comment) Other Brace/Splint: R wrist brace d/c'd 07/04/17, C collar can be off in bed Restrictions Weight Bearing Restrictions: Yes RUE Weight Bearing: Non weight bearing LUE Weight Bearing: Weight bearing as tolerated Other Position/Activity Restrictions: pt with R thumb subluxation in early January  See Function Navigator for Current Functional Status.    Leotis Shames [facility] 07/08/2017, 2:59 PM

## 2017-07-08 NOTE — Progress Notes (Signed)
Occupational Therapy Session Note  Patient Details  Name: Keith [name] MRN: [id number] Date of Birth: 07/14/1995  Today's Date: 07/08/2017 OT Individual Time: 0800-0900 OT Individual Time Calculation (min): 60 min    Short Term Goals: Week 3:  OT Short Term Goal 1 (Week 3): Pt will roll B in bed during ADLs with MAX A of 1 caregiver OT Short Term Goal 2 (Week 3): Pt will sit in long sitting with mod A while engaged in UB bathing/dressing tasks OT Short Term Goal 3 (Week 3): Pt will perform slide board transfer with max A + 2   Skilled Therapeutic Interventions/Progress Updates:    OT intervention with focus on bed mobility, UB dressing tasks, sitting balance EOB, and functional slide board transfer to w/c.  Pt requires max A for rolliing in bed to assist with donning pants.  Pt able to sit in long sitting in bed to thread shirts sleeves with mod A.  Pt requires tot A for supine>sitting EOB in preparation for slide board transfer to w/c with tot A + 2.  RN applied dressing to R lower leg prior to wrapping RLE with Ace wraps and donning Teds on LLE with Ace wrapping.  Pt directs care with min verbal cues.    Therapy Documentation Precautions:  Precautions Precautions: Fall, Cervical Precaution Comments: [name] - no ROM restrictions and WBAT 06/09/17 Required Braces or Orthoses: Cervical Brace Cervical Brace: Hard collar, Other (comment) Other Brace/Splint: R wrist brace d/c'd 07/04/17, C collar can be off in bed Restrictions Weight Bearing Restrictions: Yes RUE Weight Bearing: Non weight bearing LUE Weight Bearing: Weight bearing as tolerated Other Position/Activity Restrictions: pt with R thumb subluxation in early January   Pain:  Pt denies pain  See Function Navigator for Current Functional Status.   Therapy/Group: Individual Therapy  [name], [name] 07/08/2017, 12:09 PM
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
## 2017-07-09 ENCOUNTER — Inpatient Hospital Stay (HOSPITAL_COMMUNITY): Payer: BLUE CROSS/BLUE SHIELD

## 2017-07-09 ENCOUNTER — Inpatient Hospital Stay (HOSPITAL_COMMUNITY): Payer: BLUE CROSS/BLUE SHIELD | Admitting: Physical Therapy

## 2017-07-09 DIAGNOSIS — L739 Follicular disorder, unspecified: Secondary | ICD-10-CM

## 2017-07-09 NOTE — Progress Notes (Signed)
Physical Therapy Session Note  Patient Details  Name: Keith [name] MRN: [id number] Date of Birth: 03/02/1996  Today's Date: 07/09/2017 PT Individual Time: 1030-1100 and 1300-1345 PT Individual Time Calculation (min): 30 min and 45 min (total 75 min)   Short Term Goals: Week 4:  PT Short Term Goal 1 (Week 4): =LTG d/t ELOS  Skilled Therapeutic Interventions/Progress Updates: Tx 1: Pt received seated in w/c, denies pain and agreeable to treatment. Pt propelled w/c on unit over level surfaces including weaving in and out of cones, in and out of accessible bathroom with incidental cueing for technique and safety d/t size of equipment. Pt managed power w/c on ramp with S. Transfer back to bed at end of session with slideboard and +2A. TotalA sit >supine and repositioning in bed. Remained in bed at end of session, mother present and all needs in reach.   Tx 2: Pt received seated in bed, denies pain and agreeable to treatment. Pt performed supine>long sit totalA to demo stretching and functional position to sister. From long sit performed long>short sit with totalA +2 with sister assisting. Transfer bed>w/c with maxA +2 with sister performing lead of transfer. Educated regarding head/hips and goal of shifting pt forward to scoot hips as opposed to dead lifting pt's body. Transfer w/c >mat table maxA +2; utilized dycem for hands and momentum with anterior/L lateral lean forward to facilitate hips up and right towards mat; still requires maxA +2 however less lifting required from therapists. Sitting balance on edge of mat in short sit with S for periods up to 1 min; LOBs requiring totalA to recover however improving righting reactions and use of UE positioning to slow descent during LOB or prevent LOB. Performed alternating UE movements in/out of lap, high five to therapist in front of him, and ball hits 2-3 hits in a row before LOB. Handoff to OT at end of session.      Therapy  Documentation Precautions:  Precautions Precautions: Fall, Cervical Precaution Comments: [name] - no ROM restrictions and WBAT 06/09/17 Required Braces or Orthoses: Cervical Brace Cervical Brace: Hard collar, Other (comment) Other Brace/Splint: R wrist brace d/c'd 07/04/17, C collar can be off in bed Restrictions Weight Bearing Restrictions: Yes RUE Weight Bearing: Non weight bearing LUE Weight Bearing: Weight bearing as tolerated Other Position/Activity Restrictions: pt with R thumb subluxation in early January Pain: Pain Assessment Pain Assessment: 0-10 Pain Score: 0-No pain   See Function Navigator for Current Functional Status.   Therapy/Group: Individual Therapy  Vista Lawmanlizabeth J Tygielski 07/09/2017, 12:07 PM

## 2017-07-09 NOTE — Progress Notes (Signed)
Occupational Therapy Session Note  Patient Details  Name: Keith [name] MRN: [id number] Date of Birth: [birth year]  Today's Date: 07/09/2017 OT Individual Time: 0900-1000 OT Individual Time Calculation (min): 60 min    Short Term Goals: Week 3:  OT Short Term Goal 1 (Week 3): Pt will roll B in bed during ADLs with MAX A of 1 caregiver OT Short Term Goal 1 - Progress (Week 3): Met OT Short Term Goal 2 (Week 3): Pt will sit in long sitting with mod A while engaged in UB bathing/dressing tasks OT Short Term Goal 2 - Progress (Week 3): Met OT Short Term Goal 3 (Week 3): Pt will perform slide board transfer with max A + 2  OT Short Term Goal 3 - Progress (Week 3): Met  Skilled Therapeutic Interventions/Progress Updates:    Pt resting in bed upon arrival with Mom and Dad present.  Parents had just completed dressing tasks including donning [name] and wrapping Ace wraps on BLE.  Pt's Dad took lead in bed mobility and slide board transfer to wc.  Pt's Dad required max verbal cues for sequencing and positioning.  Educated Dad on proper and safe body mechanics.  Pt's Mom attempted to assist with transfer and pt started slipping required tot A + 3 to lift into chair.  Debriefed Mom and Dad and explained the importance of board position and position of pt during transfer to prevent slipping off slide board.  Pt powered to gym and engaged in sitting balance tasks in long sitting.  Pt engaged in hitting beach ball and catching/tossing beach ball with occasional min A for balance with rapid movement. Pt returned to room and remained in w/c with family present.   Therapy Documentation Precautions:  Precautions Precautions: Fall, Cervical Precaution Comments: [name] - no ROM restrictions and WBAT 06/09/17 Required Braces or Orthoses: Cervical Brace Cervical Brace: Hard collar, Other (comment) Other Brace/Splint: R wrist brace d/c'd 07/04/17, C collar can be off in  bed Restrictions Weight Bearing Restrictions: Yes RUE Weight Bearing: Non weight bearing LUE Weight Bearing: Weight bearing as tolerated Other Position/Activity Restrictions: pt with R thumb subluxation in early January   Pain: Pain Assessment Pain Assessment: 0-10 Pain Score: 0-No pain  See Function Navigator for Current Functional Status.   Therapy/Group: Individual Therapy  Leroy Libman 07/09/2017, 12:22 PM
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
## 2017-07-09 NOTE — Progress Notes (Signed)
Social Work Patient ID: Keith [name], male   DOB: December 19, 1995, 22 y.o.   MRN: [id number]  Met with pt and mother yesterday following team conference to review d/c arrangements/ referrals.  Discussed DME needs and HH.  Mother reports that she is renting a ramp for home entry and that they are going to pick up a w/c accessible [name] today.  Preparing for d/c on Friday.  Torien Ramroop, LCSW

## 2017-07-09 NOTE — Progress Notes (Signed)
Occupational Therapy Note  Patient Details  Name: Keith [name] MRN: [id number] Date of Birth: 10/13/1995  Today's Date: 07/09/2017 OT Individual Time: 1345-1430 OT Individual Time Calculation (min): 45 min   Pt denies pain Individual Therapy  Pt sitting EOM with PT upon arrival.  OT intervention with initial focus on sitting balance EOM while hitting and tossing beach ball the Rehab Tech.  Pt required min/mod A for sitting balance with occasional max A when reaching significantly outside BOS.  Pt transferred to w/c with tot A + 2 for slide board transfer.  Pt powered w/c to table and engaged in game of checkers with focus on BUE use to push checker markers to appropriate space.  Pt required occasional assistance to move checkers to higher spaces.  Pt returned to room and remained in w/c with Mom present.    [name], Aneudy Champlain [facility] 07/09/2017, 2:52 PM

## 2017-07-10 ENCOUNTER — Inpatient Hospital Stay (HOSPITAL_COMMUNITY): Payer: BLUE CROSS/BLUE SHIELD | Admitting: Occupational Therapy

## 2017-07-10 ENCOUNTER — Telehealth: Payer: Self-pay | Admitting: Internal Medicine

## 2017-07-10 ENCOUNTER — Inpatient Hospital Stay (HOSPITAL_COMMUNITY): Payer: BLUE CROSS/BLUE SHIELD | Admitting: *Deleted

## 2017-07-10 ENCOUNTER — Inpatient Hospital Stay (HOSPITAL_COMMUNITY): Payer: BLUE CROSS/BLUE SHIELD

## 2017-07-10 ENCOUNTER — Inpatient Hospital Stay (HOSPITAL_COMMUNITY): Payer: BLUE CROSS/BLUE SHIELD | Admitting: Physical Therapy

## 2017-07-10 NOTE — Progress Notes (Signed)
RN was instructed to educate mother on administration of lovenox for home use in preparation for upcoming discharge. Pt's mother was instructed and demonstrated proper technique for administering this medication.

## 2017-07-10 NOTE — Telephone Encounter (Signed)
Copied from CRM [phone]#[id number]. Topic: Quick Communication - See Telephone Encounter >> Jul 10, 2017  3:45 PM Lelon FrohlichGolden, Tashia, RMA wrote: CRM for notification. See Telephone encounter for:   07/10/17 verbal orders needed for nursing PT OT and eval and treat please call [phone] Kenney Housemananya

## 2017-07-10 NOTE — Progress Notes (Signed)
Occupational Therapy Session Note  Patient Details  Name: Keith [name] MRN: [id number] Date of Birth: Oct 02, 1995  Today's Date: 07/10/2017 OT Individual Time: 1300-1430 OT Individual Time Calculation (min): 90 min    Short Term Goals: Week 3:  OT Short Term Goal 1 (Week 3): Pt will roll B in bed during ADLs with MAX A of 1 caregiver OT Short Term Goal 1 - Progress (Week 3): Met OT Short Term Goal 2 (Week 3): Pt will sit in long sitting with mod A while engaged in UB bathing/dressing tasks OT Short Term Goal 2 - Progress (Week 3): Met OT Short Term Goal 3 (Week 3): Pt will perform slide board transfer with max A + 2  OT Short Term Goal 3 - Progress (Week 3): Met  Skilled Therapeutic Interventions/Progress Updates:    OT intervention with focus on bathing at shower level and dressing at bed level.  Pt's Mom and Dad present to assist with transfer and bathing/dressing tasks.  Pt's parents assisted with placing sling for Maxi Move transfer to rolling shower chair.  Pt engaged in bathing tasks after bath mit donned on LUE.  Pt able to bathe RUE, abdomen, chest, perineal area, and B upper legs.  Pt required tot A for LB dressing tasks after transferred back to bed.  Pt able to thread BUE into shirt sleeves but required assistance donning over head and pulling over trunk.  Pt performed slide board transfer to w/c with tot A + 2.  Pt remained in w/c with family present.   Therapy Documentation Precautions:  Precautions Precautions: Fall, Cervical Precaution Comments: [name] - no ROM restrictions and WBAT 06/09/17 Required Braces or Orthoses: Cervical Brace Cervical Brace: Hard collar, Other (comment) Other Brace/Splint: R wrist brace d/c'd 07/04/17, C collar can be off in bed Restrictions Weight Bearing Restrictions: Yes RUE Weight Bearing: Non weight bearing LUE Weight Bearing: Weight bearing as tolerated Other Position/Activity Restrictions: pt with R thumb subluxation in  early January Pain:  Pt c/o "slight" HA at beginning of session but stated it was better after sitting up  See Function Navigator for Current Functional Status.   Therapy/Group: Individual Therapy  Leroy Libman 07/10/2017, 3:10 PM
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
## 2017-07-10 NOTE — Progress Notes (Signed)
Occupational Therapy Discharge Summary  Patient Details  Name: Keith [name] MRN: [id number] Date of Birth: 1995-09-24  Patient has met 5 of 5 long term goals due to improved activity tolerance, improved balance, functional use of  RIGHT upper and LEFT upper extremity and ability to direct his care.  Pt made steady progress with BADLs and sitting balance during this admission.  Pt is mod A for bathing tasks in supported sitting and mod A for UB dressing tasks when seated unsupported with mod A.  Pt is tot A for LB dressing tasks. Pt requires close supervision for static sitting in long sitting and mod A/max A for dynamic sitting in long sitting.  Pt is tot A + 2 for slide board transfers.  Pt's Mom and Dad had received education for hoyer lift transfers, slide board transfers and BADLs.  Mom and Dad verbalized understanding of recommendation for no shower at home until cleared by [place].  Patient's care partner is independent to provide the necessary physical assistance at discharge.    Recommendation:  Patient will benefit from ongoing skilled OT services in home health setting to continue to advance functional skills in the area of BADL and Reduce care partner burden.  Equipment: Hoyer lift  Reasons for discharge: treatment goals met and discharge from hospital  Patient/family agrees with progress made and goals achieved: Yes  OT Discharge Vision Baseline Vision/History: No visual deficits Vision Assessment?: No apparent visual deficits Perception  Perception: Within Functional Limits Praxis Praxis: Intact Cognition Overall Cognitive Status: Within Functional Limits for tasks assessed Arousal/Alertness: Awake/alert Attention: Selective Selective Attention: Appears intact Memory: Appears intact Awareness: Appears intact Problem Solving: Appears intact Safety/Judgment: Appears intact Sensation Sensation Light Touch: Impaired by gross assessment Hot/Cold: Appears Intact Motor   Motor Motor: Tetraplegia Motor - Skilled Clinical Observations: pt with bicep, supniation/pronation, shoulder elevation, flexion and abduction grossly at 2+/3 R>L    Trunk/Postural Assessment  Cervical Assessment Cervical Assessment: Exceptions to WFL(cervical collar) Thoracic Assessment Thoracic Assessment: Exceptions to WFL(rounderd shoulders) Lumbar Assessment Lumbar Assessment: Exceptions to WFL(posterior pelvic tilt) Postural Control Postural Control: Deficits on evaluation(delayed/absent)  Balance Static Sitting Balance Static Sitting - Balance Support: Bilateral upper extremity supported Static Sitting - Level of Assistance: 5: Stand by assistance Dynamic Sitting Balance Sitting balance - Comments: max A with BUE support Extremity/Trunk Assessment RUE Assessment RUE Assessment: Exceptions to [facility] 2+ overall; no finger flexion/extension or wrist LUE Assessment LUE Assessment: Exceptions to [facility] 2+ overall' no finger flexion/extensio or wrist   See Function Navigator for Current Functional Status.  Leotis Shames [facility] 07/10/2017, 3:21 PM

## 2017-07-10 NOTE — Discharge Instructions (Signed)
Inpatient Rehab Discharge Instructions  Keith [name] Discharge date and time: No discharge date for patient encounter.   Activities/Precautions/ Functional Status: Activity: Cervical collar when out of bed Diet: regular diet Wound Care: Routine skin checks Functional status:  ___ No restrictions     ___ Walk up steps independently ___ 24/7 supervision/assistance   ___ Walk up steps with assistance ___ Intermittent supervision/assistance  ___ Bathe/dress independently ___ Walk with walker     _x__ Bathe/dress with assistance ___ Walk Independently    ___ Shower independently ___ Walk with assistance    ___ Shower with assistance ___ No alcohol     ___ Return to work/school ________    COMMUNITY REFERRALS UPON DISCHARGE:    Home Health:   PT     OT    RN                     Agency:  [facility] Phone: [phone]   Medical Equipment/Items Ordered: wheelchair, hospital bed, hoyer lift, drop arm commode                                                     Agency/Supplier:  [facility] @ [phone]   GENERAL COMMUNITY RESOURCES FOR PATIENT/FAMILY: Employment Assistance:  Vocational Rehab  Home Modification: Independent Living       Special Instructions:  Continue subcutaneous Lovenox 60 mg daily until 08/25/2017 and stop  My questions have been answered and I understand these instructions. I will adhere to these goals and the provided educational materials after my discharge from the hospital.  Patient/Caregiver Signature _______________________________ Date __________  Clinician Signature _______________________________________ Date __________  Please bring this form and your medication list with you to all your follow-up doctor's appointments.

## 2017-07-10 NOTE — Progress Notes (Signed)
Physical Therapy Session Note  Patient Details  Name: Keith [name] MRN: [id number] Date of Birth: 06/30/1995  Today's Date: 07/10/2017 PT Individual Time: 1500-1600 PT Individual Time Calculation (min): 60 min   Short Term Goals: Week 4:  PT Short Term Goal 1 (Week 4): =LTG d/t ELOS  Skilled Therapeutic Interventions/Progress Updates: Pt received seated in w/c with family present. Security escort provided for transport to/from hospital entrance to assess w/c fit in accessible [place] with company vendor. Pt navigated power chair on level/unlevel surfaces indoors/outdoors, including on/off elevators and in/out of van via ramp with incidental cueing for safety and technique. Discussed pros/cons of various [name] features with vendor, patient and parents. Discussed d/c planning to have [name] available for d/c tomorrow. Remained seated in w/c at end of session, all needs in reach and family present.      Therapy Documentation Precautions:  Precautions Precautions: Fall, Cervical Precaution Comments: [name] - no ROM restrictions and WBAT 06/09/17 Required Braces or Orthoses: Cervical Brace Cervical Brace: Hard collar, Other (comment) Other Brace/Splint: R wrist brace d/c'd 07/04/17, C collar can be off in bed Restrictions Weight Bearing Restrictions: Yes RUE Weight Bearing: Non weight bearing LUE Weight Bearing: Weight bearing as tolerated Other Position/Activity Restrictions: pt with R thumb subluxation in early January   See Function Navigator for Current Functional Status.   Therapy/Group: Individual Therapy  Vista Lawmanlizabeth J Tygielski 07/10/2017, 3:59 PM

## 2017-07-10 NOTE — Progress Notes (Signed)
Occupational Therapy Session Note  Patient Details  Name: Keith [name] MRN: [id number] Date of Birth: [birth year]  Today's Date: 07/10/2017 OT Individual Time: 0800-0900 OT Individual Time Calculation (min): 60 min    Short Term Goals: Week 3:  OT Short Term Goal 1 (Week 3): Pt will roll B in bed during ADLs with MAX A of 1 caregiver OT Short Term Goal 1 - Progress (Week 3): Met OT Short Term Goal 2 (Week 3): Pt will sit in long sitting with mod A while engaged in UB bathing/dressing tasks OT Short Term Goal 2 - Progress (Week 3): Met OT Short Term Goal 3 (Week 3): Pt will perform slide board transfer with max A + 2  OT Short Term Goal 3 - Progress (Week 3): Met  Skilled Therapeutic Interventions/Progress Updates:    Ot intervention with focus on continuing family education, sitting balance EOB, and BUE therapeutic activities with emphasis on functional use.  Pt's Dad completed dressing tasks without assistance and assisted with slide board transfer to w/c.  Pt powered to gym and engaged in [place] activities hitting beach ball, catching, and tossing with emphasis on controlled movement.  Pt returned to room and remained in w/c with family present.   Therapy Documentation Precautions:  Precautions Precautions: Fall, Cervical Precaution Comments: [name] - no ROM restrictions and WBAT 06/09/17 Required Braces or Orthoses: Cervical Brace Cervical Brace: Hard collar, Other (comment) Other Brace/Splint: R wrist brace d/c'd 07/04/17, C collar can be off in bed Restrictions Weight Bearing Restrictions: Yes RUE Weight Bearing: Non weight bearing LUE Weight Bearing: Weight bearing as tolerated Other Position/Activity Restrictions: pt with R thumb subluxation in early January   Pain: Pain Assessment Pain Assessment: 0-10 Pain Score: 4  Pain Type: Acute pain Pain Location: Arm Pain Orientation: Right Pain Descriptors / Indicators: Aching Pain Intervention(s): Medication  (See eMAR)  See Function Navigator for Current Functional Status.   Therapy/Group: Individual Therapy  Leroy Libman 07/10/2017, 12:22 PM

## 2017-07-10 NOTE — Progress Notes (Signed)
[facility] with [facility].  AD complete .copies given to patient and agent and one to nurse for chart.   07/10/17 1413  Clinical Encounter Type  Visited With Patient and family together;Health care provider  Visit Type Initial;Spiritual support  Referral From Nurse;Social work  Spiritual Encounters  Spiritual Needs Literature;Emotional  Stress Factors  Patient Stress Factors None identified  Family Stress Factors None identified  Advance Directives (For Healthcare)  Does Patient Have a Medical Advance Directive? Yes  Type of Estate agentAdvance Directive Healthcare Power of [place];Living will  Copy of Healthcare Power of Attorney in Chart? Yes  Copy of Living Will in Chart? Yes  Fae PippinWatlington, Chryl [place], Chaplain, [facility], Pager [phone]

## 2017-07-10 NOTE — Progress Notes (Addendum)
Physical Therapy Discharge Summary  Patient Details  Name: Keith [name] MRN: [id number] Date of Birth: 08/28/1995  Today's Date: 07/10/2017 PT Individual Time: 1500-1600 PT Individual Time Calculation (min): 60 min    Patient has met 3 of 5 long term goals due to improved activity tolerance, improved balance, improved postural control, increased strength, ability to compensate for deficits, functional use of  right upper extremity and left upper extremity and improved coordination.  Patient to discharge at a wheelchair level Supervision for power W/C, max/totalA +2 for bed mobility, sitting balance, trasnfers.   Patient's care partner is independent to provide the necessary physical assistance at discharge.  Reasons goals not met: Pt unable to perform bed mobility with modA d/t body mass, however demonstrates improved ability to assist with rolling and pulls to sit by hooking UE, use of leg loop on RLE was discontinued d/t blisters on R lower leg therefore further progress could not be made. Pt demonstrates dynamic sitting balance with supervision in long-sitting, maxA in short-sitting however demonstrates improved ability to utilize head positioning and BUE to maintain and recover balance.   Recommendation:  Patient will benefit from ongoing skilled PT services in home health setting to continue to advance safe functional mobility, address ongoing impairments in transfers, dynamic sitting balance, and minimize fall risk.  Equipment: loaner power W/C, slide board, hospital bed, manual hoyer lift  Reasons for discharge: treatment goals met and unmet goals with sufficient progress and family sufficiently educated for return to home environment  Patient/family agrees with progress made and goals achieved: Yes  Pt greeted sitting upright in power W/C with sister present, agreeable to therapy. Pt expressed desire to sit on couch at home upon D/C, discussed need for pressure relief on couch as  well. Pt drives power W/C to therapy apartment, transfers W/C<>couch with maxA +2 using slide board. Pt reports is very comfortable on couch, is observant that couch is low surface. Discussed options for transferring at home, suggests using slideboard to couch and using lift couch> chair. Encouraged pt to continue assisting family/caregivers and to explore different ways to interact with his environment. Pt expresses readiness to return to home and expresses understanding of self-advocacy. Pt engages in Wii golf activity emphasizing sitting balance, remote control adapted with coban to compensate decreased grip. At end of session, pt returns to room in power W/C, sister assists W/C> bed maxA+2  slideboard transfer, transitions sit to supine with totalA, pt positioned in supine for catheterization, denies any needs at this time. PT Discharge Precautions/Restrictions   cervical collar to be worn OOB Vision/Perception  Perception Perception: Within Functional Limits Praxis Praxis: Intact  Cognition Overall Cognitive Status: Within Functional Limits for tasks assessed Arousal/Alertness: Awake/alert Attention: Selective Selective Attention: Appears intact Memory: Appears intact Awareness: Appears intact Problem Solving: Appears intact Safety/Judgment: Appears intact Sensation Sensation  Light Touch: Impaired by gross assessment below T6/7 level Hot/Cold: Appears Intact Motor  Motor Motor: Tetraplegia Motor - Skilled Clinical Observations: pt with bicep, supniation/pronation, shoulder elevation, flexion and abduction grossly at 2+/3 R>L 0/5 BLE  Mobility Bed Mobility Bed Mobility: Rolling Right;Rolling Left Rolling Right: 1: +2 Total assist Rolling Right: Patient Percentage: 20% Rolling Right Details: Verbal cues for sequencing;Verbal cues for technique;Verbal cues for precautions/safety Rolling Left: 1: +2 Total assist Rolling Left: Patient Percentage: 20% Rolling Left Details: Verbal  cues for technique;Verbal cues for sequencing;Verbal cues for precautions/safety Transfers Transfers: Yes Lateral/Scoot Transfers: With slide board;2: Max assist Lateral/Scoot Transfer Details: Manual facilitation for weight  shifting;Manual facilitation for placement;Manual facilitation for weight bearing;Verbal cues for technique;Verbal cues for sequencing Locomotion  Ambulation Ambulation: No Gait Gait: No Stairs / Additional Locomotion Stairs: No Wheelchair Mobility Wheelchair Mobility: Yes Wheelchair Assistance: 5: Careers information officer: Power Wheelchair Parts Management: Needs assistance Distance: 335f including 3% grade ramp, doorways, turns   Trunk/Postural Assessment  Cervical Assessment Cervical Assessment: Exceptions to WFL(cervical collar) Thoracic Assessment Thoracic Assessment: Exceptions to WFL(rounderd shoulders) Lumbar Assessment Lumbar Assessment: Exceptions to WFL(posterior pelvic tilt) Postural Control Postural Control: Deficits on evaluation(delayed/absent)  Balance Static Sitting Balance Static Sitting - Balance Support: Bilateral upper extremity supported Static Sitting - Level of Assistance: 5: Stand by assistance Dynamic Sitting Balance Sitting balance - Comments: max A with BUE support Extremity Assessment  RUE Assessment RUE Assessment: Exceptions to [facility] triceps  LUE Assessment LUE Assessment: Exceptions to [facility] triceps  RLE Assessment RLE Assessment: Exceptions to WFL(0/5 strength throughout) LLE Assessment LLE Assessment: Exceptions to WFL(0/5 throughout)   See Function Navigator for Current Functional Status.  MSalvatore Decent2/28/2019, 4:17 PM

## 2017-07-10 NOTE — Progress Notes (Signed)
Physical Therapy Session Note  Patient Details  Name: Keith [name] MRN: [id number] Date of Birth: 09/07/1995  Today's Date: 07/10/2017 PT Individual Time: 0900-0928 PT Individual Time Calculation (min): 28 min   Short Term Goals: Week 4:  PT Short Term Goal 1 (Week 4): =LTG d/t ELOS  Skilled Therapeutic Interventions/Progress Updates:    Session focused on w/c mobility training with power w/c in home and community environment including navigating tight obstacles, ramp/uneven surface, and ADL apartment with overall supervision demonstrating good control of w/c. Education on positioning of w/c in tilted position during ramp and uneven terrain negotiation for safety. 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 Pt able to manipulate controls on w/c with extra time. Pt denies concerns and excited for d/c tomorrow.  Therapy Documentation Precautions:  Precautions Precautions: Fall, Cervical Precaution Comments: [name] - no ROM restrictions and WBAT 06/09/17 Required Braces or Orthoses: Cervical Brace Cervical Brace: Hard collar, Other (comment) Other Brace/Splint: R wrist brace d/c'd 07/04/17, C collar can be off in bed Restrictions Weight Bearing Restrictions: Yes RUE Weight Bearing: Non weight bearing LUE Weight Bearing: Weight bearing as tolerated Other Position/Activity Restrictions: pt with R thumb subluxation in early January   Pain: Denies pain. Reports feeling kind of off today from not sleeping well last night.    See Function Navigator for Current Functional Status.   Therapy/Group: Individual Therapy  Karolee StampsGray, Aniel Hubble Darrol PokeBrescia  Angelli Baruch B. Conall Vangorder, PT, DPT  07/10/2017, 10:20 AM

## 2017-07-10 NOTE — Discharge Summary (Signed)
Discharge summary job [phone]

## 2017-07-10 NOTE — Progress Notes (Signed)
Due to permanent quadriplegia and neurogenic bladder patient should catheterize every 4 hours for lifetime need.

## 2017-07-11 MED ORDER — BACLOFEN 5 MG PO TABS: 5.0000 mg | ORAL_TABLET | Freq: Three times a day (TID) | ORAL | 1 refills | Status: DC | PRN

## 2017-07-11 MED ORDER — FERROUS SULFATE 325 (65 FE) MG PO TABS: 325.0000 mg | ORAL_TABLET | Freq: Every day | ORAL | 3 refills | Status: DC

## 2017-07-11 MED ORDER — OXYCODONE HCL 5 MG PO TABS: 5.0000 mg | ORAL_TABLET | ORAL | 0 refills | Status: DC | PRN

## 2017-07-11 MED ORDER — IBUPROFEN 400 MG PO TABS: 400.0000 mg | ORAL_TABLET | Freq: Four times a day (QID) | ORAL | 0 refills | Status: DC | PRN

## 2017-07-11 MED ORDER — NORTRIPTYLINE HCL 10 MG PO CAPS: 10.0000 mg | ORAL_CAPSULE | Freq: Every day | ORAL | 1 refills | Status: DC

## 2017-07-11 MED ORDER — POLYETHYLENE GLYCOL 3350 17 G PO PACK: 17.0000 g | PACK | Freq: Every day | ORAL | 0 refills | Status: DC

## 2017-07-11 MED ORDER — CYCLOBENZAPRINE HCL 7.5 MG PO TABS
7.5000 mg | ORAL_TABLET | Freq: Three times a day (TID) | ORAL | 0 refills | Status: DC | PRN
Start: 2017-07-11 — End: 2018-12-04

## 2017-07-11 MED ORDER — DOXYCYCLINE HYCLATE 100 MG PO TABS: 100.0000 mg | ORAL_TABLET | Freq: Two times a day (BID) | ORAL | 0 refills | Status: DC

## 2017-07-11 MED ORDER — PANTOPRAZOLE SODIUM 40 MG PO TBEC: 40.0000 mg | DELAYED_RELEASE_TABLET | Freq: Every day | ORAL | 0 refills | Status: DC

## 2017-07-11 MED ORDER — GABAPENTIN 300 MG PO CAPS: 300.0000 mg | ORAL_CAPSULE | Freq: Three times a day (TID) | ORAL | 1 refills | Status: DC

## 2017-07-11 MED ORDER — ENOXAPARIN SODIUM 60 MG/0.6ML ~~LOC~~ SOLN: 60.0000 mg | SUBCUTANEOUS | 0 refills | Status: DC

## 2017-07-11 MED ORDER — BISACODYL 10 MG RE SUPP: 10.0000 mg | RECTAL | 0 refills | Status: DC

## 2017-07-11 MED ORDER — BISACODYL 10 MG RE SUPP: 10.0000 mg | Freq: Every day | RECTAL | 0 refills | Status: DC | PRN

## 2017-07-11 NOTE — Telephone Encounter (Signed)
I haven t seen this patient since 2014   Thus I am not his PCP.  He will have to establish with new provider  .  However ir seems that the rehabilitation  Team should be doing these orders in the interim.

## 2017-07-11 NOTE — Telephone Encounter (Signed)
[facility]  Please advise Dr Fabian SharpPanosh, thanks.

## 2017-07-11 NOTE — Telephone Encounter (Signed)
Returned call to Keith [name], aware of rec's below. States that an MD at Rehab is following his care and Dr Fabian SharpPanosh is no longer needed to give orders.  Nothing further needed.

## 2017-07-11 NOTE — Progress Notes (Signed)
Social Work  Discharge Note  The overall goal for the admission was met for:   Discharge location: Yes- home with family providing 24/7 assistance  Length of Stay: Yes - 29 days  Discharge activity level: Yes - max - total +2 w/c level  Home/community participation: Yes  Services provided included: MD, RD, PT, OT, RN, TR, Pharmacy, [place]: Private Insurance: [employer]  Follow-up services arranged: Home Health: RN, PT, OT via [facility], DME: power wheelchair, hoyer lift, hospital bed with pressure pad, wide drop arm commode, 30" transfer board all via [place] and Patient/Family has no preference for HH/DME agencies  Comments (or additional information):  Patient/Family verbalized understanding of follow-up arrangements: Yes  Individual responsible for coordination of the follow-up plan: pt  Confirmed correct DME delivered: Celestine Prim 07/11/2017    Tonae Livolsi
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
## 2017-07-11 NOTE — Progress Notes (Signed)
Pt in room with family ready to leave, packed up, [facility]

## 2017-07-15 ENCOUNTER — Telehealth: Payer: Self-pay | Admitting: Internal Medicine

## 2017-07-15 NOTE — Telephone Encounter (Signed)
No message attached

## 2017-07-15 NOTE — Telephone Encounter (Signed)
Copied from CRM [phone]. Topic: Quick Communication - See Telephone Encounter >> Jul 15, 2017  1:53 PM Landry MellowFoltz, Melissa J wrote: CRM for notification. See Telephone encounter for:  07/15/17.

## 2017-07-17 ENCOUNTER — Telehealth: Payer: Self-pay | Admitting: Internal Medicine

## 2017-07-17 NOTE — Telephone Encounter (Signed)
Copied from CRM [phone]. Topic: Quick Communication - See Telephone Encounter >> Jul 17, 2017  4:08 PM Rudi CocoLathan, Analiese Krupka M, NT wrote: CRM for notification. See Telephone encounter for:   07/17/17. Radovan calling from [facility] to get verbal orders for pt. For OT 1 week 1 2 week 4 Radovan can be reached at [phone] can leave vm
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
## 2017-07-18 ENCOUNTER — Telehealth: Payer: Self-pay

## 2017-07-18 NOTE — Telephone Encounter (Signed)
Baruch GoutyErin Webb PT [facility] called requesting verbal orders for patient to be seen for PT for 2xwk X 4wks for safety and management.   Called her back and approved verbal orders.  Also she stated that patient did not receive baclofen medication which was prescribed for this patient from the pharmacy.  Called patient for information on which pharmacy they went to.  Called [facility] on [address] and [address] and spoke with pharmacy tech there, they stated that the patients family member did pick up that medication at the same time as the cyclobenzaprine.  Called patient back with my findings and advised them that they should get in contact with that pharmacy to see how the missing medication issue can be resolved.

## 2017-07-18 NOTE — Telephone Encounter (Signed)
See staff message   Will not do these orders   Not an active patient in my practice  Refer  Orders to the managing md team rehab    If pt needs to reestablsh can do this after rehab medicine    manamgement  Pos hospital

## 2017-07-21 ENCOUNTER — Telehealth: Payer: Self-pay | Admitting: *Deleted

## 2017-07-21 NOTE — Telephone Encounter (Signed)
Called patient and left message to return call. Hospital follow-up appointment canceled per instructions.
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
## 2017-07-21 NOTE — Telephone Encounter (Signed)
Left detailed message with recommendations Per Dr Fabian SharpPanosh.  Advised if any questions to give our office a call back.  Nothing further needed.

## 2017-07-22 ENCOUNTER — Encounter (HOSPITAL_COMMUNITY): Payer: Self-pay | Admitting: Emergency Medicine

## 2017-07-22 ENCOUNTER — Emergency Department (HOSPITAL_COMMUNITY)
Admission: EM | Admit: 2017-07-22 | Discharge: 2017-07-22 | Disposition: A | Discharge status: Home / Self Care | Payer: BLUE CROSS/BLUE SHIELD | Attending: Physician Assistant | Admitting: Physician Assistant

## 2017-07-22 ENCOUNTER — Emergency Department (HOSPITAL_COMMUNITY): Payer: BLUE CROSS/BLUE SHIELD

## 2017-07-22 DIAGNOSIS — F17210 Nicotine dependence, cigarettes, uncomplicated: Secondary | ICD-10-CM | POA: Diagnosis not present

## 2017-07-22 DIAGNOSIS — M542 Cervicalgia: Secondary | ICD-10-CM | POA: Insufficient documentation

## 2017-07-22 DIAGNOSIS — R509 Fever, unspecified: Secondary | ICD-10-CM | POA: Diagnosis not present

## 2017-07-22 DIAGNOSIS — Z79899 Other long term (current) drug therapy: Secondary | ICD-10-CM | POA: Insufficient documentation

## 2017-07-22 LAB — CBC WITH DIFFERENTIAL/PLATELET
Basophils Absolute: 0 10*3/uL (ref 0.0–0.1)
Basophils Relative: 0 %
Eosinophils Absolute: 0.2 10*3/uL (ref 0.0–0.7)
Eosinophils Relative: 3 %
HEMATOCRIT: 35.4 % — AB (ref 39.0–52.0)
HEMOGLOBIN: 11.8 g/dL — AB (ref 13.0–17.0)
LYMPHS PCT: 21 %
Lymphs Abs: 1.8 10*3/uL (ref 0.7–4.0)
MCH: 28.6 pg (ref 26.0–34.0)
MCHC: 33.3 g/dL (ref 30.0–36.0)
MCV: 85.7 fL (ref 78.0–100.0)
MONO ABS: 0.6 10*3/uL (ref 0.1–1.0)
Monocytes Relative: 7 %
NEUTROS ABS: 6.2 10*3/uL (ref 1.7–7.7)
NEUTROS PCT: 69 %
Platelets: 365 10*3/uL (ref 150–400)
RBC: 4.13 MIL/uL — ABNORMAL LOW (ref 4.22–5.81)
RDW: 13.3 % (ref 11.5–15.5)
WBC: 8.9 10*3/uL (ref 4.0–10.5)

## 2017-07-22 LAB — URINALYSIS, ROUTINE W REFLEX MICROSCOPIC
BILIRUBIN URINE: NEGATIVE
GLUCOSE, UA: NEGATIVE mg/dL
Hgb urine dipstick: NEGATIVE
KETONES UR: NEGATIVE mg/dL
LEUKOCYTES UA: NEGATIVE
NITRITE: NEGATIVE
PH: 8 (ref 5.0–8.0)
Protein, ur: NEGATIVE mg/dL
SPECIFIC GRAVITY, URINE: 1.008 (ref 1.005–1.030)

## 2017-07-22 LAB — COMPREHENSIVE METABOLIC PANEL
ALBUMIN: 3.2 g/dL — AB (ref 3.5–5.0)
ALT: 142 U/L — ABNORMAL HIGH (ref 17–63)
ANION GAP: 11 (ref 5–15)
AST: 49 U/L — ABNORMAL HIGH (ref 15–41)
Alkaline Phosphatase: 89 U/L (ref 38–126)
BUN: 8 mg/dL (ref 6–20)
CHLORIDE: 103 mmol/L (ref 101–111)
CO2: 22 mmol/L (ref 22–32)
Calcium: 9.7 mg/dL (ref 8.9–10.3)
Creatinine, Ser: 0.59 mg/dL — ABNORMAL LOW (ref 0.61–1.24)
GFR calc non Af Amer: >60 mL/min (ref >60)
Glucose, Bld: 106 mg/dL — ABNORMAL HIGH (ref 65–99)
POTASSIUM: 4.7 mmol/L (ref 3.5–5.1)
SODIUM: 136 mmol/L (ref 135–145)
Total Bilirubin: 0.7 mg/dL (ref 0.3–1.2)
Total Protein: 7.8 g/dL (ref 6.5–8.1)

## 2017-07-22 LAB — I-STAT CG4 LACTIC ACID, ED: LACTIC ACID, VENOUS: 1.49 mmol/L (ref 0.5–1.9)

## 2017-07-22 MED ORDER — METHYLPREDNISOLONE 4 MG PO TBPK: ORAL_TABLET | ORAL | 0 refills | Status: DC
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
## 2017-07-22 NOTE — Discharge Instructions (Signed)
Your cervical x-ray was negative, no new fractures or loosening of hardware.   Neurosurgery recommends steroid taper (medrol pack). Continue taking your muscle relaxere (baclofen) and pain medications at home.   Monitor your symptoms and pain, if not better in 2 days follow up with Dr Conchita ParisNundkumar

## 2017-07-22 NOTE — ED Notes (Signed)
CANCELLED [facility] request; family will transport

## 2017-07-22 NOTE — ED Notes (Signed)
Patient transported to X-ray 

## 2017-07-22 NOTE — ED Notes (Signed)
Contacted [facility] for tx back to home

## 2017-07-23 ENCOUNTER — Other Ambulatory Visit: Payer: Self-pay

## 2017-07-23 ENCOUNTER — Emergency Department (HOSPITAL_COMMUNITY)
Admission: EM | Admit: 2017-07-23 | Discharge: 2017-07-23 | Disposition: A | Discharge status: Home / Self Care | Payer: BLUE CROSS/BLUE SHIELD | Attending: Emergency Medicine | Admitting: Emergency Medicine

## 2017-07-23 ENCOUNTER — Telehealth: Payer: Self-pay | Admitting: Physical Medicine & Rehabilitation

## 2017-07-23 ENCOUNTER — Emergency Department (HOSPITAL_COMMUNITY): Payer: BLUE CROSS/BLUE SHIELD

## 2017-07-23 ENCOUNTER — Encounter (HOSPITAL_COMMUNITY): Payer: Self-pay

## 2017-07-23 DIAGNOSIS — R51 Headache: Secondary | ICD-10-CM | POA: Diagnosis not present

## 2017-07-23 DIAGNOSIS — G8220 Paraplegia, unspecified: Secondary | ICD-10-CM | POA: Insufficient documentation

## 2017-07-23 DIAGNOSIS — F17210 Nicotine dependence, cigarettes, uncomplicated: Secondary | ICD-10-CM | POA: Diagnosis not present

## 2017-07-23 DIAGNOSIS — Z79899 Other long term (current) drug therapy: Secondary | ICD-10-CM | POA: Insufficient documentation

## 2017-07-23 DIAGNOSIS — R519 Headache, unspecified: Secondary | ICD-10-CM

## 2017-07-23 LAB — CBC WITH DIFFERENTIAL/PLATELET
BASOS ABS: 0 10*3/uL (ref 0.0–0.1)
BASOS PCT: 0 %
Eosinophils Absolute: 0.2 10*3/uL (ref 0.0–0.7)
Eosinophils Relative: 3 %
HCT: 34.8 % — ABNORMAL LOW (ref 39.0–52.0)
HEMOGLOBIN: 11.4 g/dL — AB (ref 13.0–17.0)
Lymphocytes Relative: 16 %
Lymphs Abs: 1.3 10*3/uL (ref 0.7–4.0)
MCH: 28.6 pg (ref 26.0–34.0)
MCHC: 32.8 g/dL (ref 30.0–36.0)
MCV: 87.2 fL (ref 78.0–100.0)
MONO ABS: 0.7 10*3/uL (ref 0.1–1.0)
Monocytes Relative: 9 %
NEUTROS PCT: 72 %
Neutro Abs: 6 10*3/uL (ref 1.7–7.7)
Platelets: 402 10*3/uL — ABNORMAL HIGH (ref 150–400)
RBC: 3.99 MIL/uL — AB (ref 4.22–5.81)
RDW: 13 % (ref 11.5–15.5)
WBC: 8.3 10*3/uL (ref 4.0–10.5)

## 2017-07-23 LAB — COMPREHENSIVE METABOLIC PANEL
ALT: 104 U/L — ABNORMAL HIGH (ref 17–63)
ANION GAP: 10 (ref 5–15)
AST: 30 U/L (ref 15–41)
Albumin: 3.3 g/dL — ABNORMAL LOW (ref 3.5–5.0)
Alkaline Phosphatase: 91 U/L (ref 38–126)
BUN: 16 mg/dL (ref 6–20)
CALCIUM: 9.6 mg/dL (ref 8.9–10.3)
CO2: 26 mmol/L (ref 22–32)
Chloride: 102 mmol/L (ref 101–111)
Creatinine, Ser: 0.54 mg/dL — ABNORMAL LOW (ref 0.61–1.24)
Glucose, Bld: 97 mg/dL (ref 65–99)
POTASSIUM: 4.7 mmol/L (ref 3.5–5.1)
Sodium: 138 mmol/L (ref 135–145)
TOTAL PROTEIN: 7.9 g/dL (ref 6.5–8.1)
Total Bilirubin: 0.3 mg/dL (ref 0.3–1.2)

## 2017-07-23 LAB — URINE CULTURE: CULTURE: NO GROWTH

## 2017-07-23 MED ORDER — HYDROCODONE-ACETAMINOPHEN 5-325 MG PO TABS
1.0000 | ORAL_TABLET | Freq: Once | ORAL | Status: DC
  Filled 2017-07-23: qty 1
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
## 2017-07-23 NOTE — ED Notes (Signed)
Note: all entries in this chart by me are to be disregarded--wrong pt.
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
## 2017-07-23 NOTE — Telephone Encounter (Signed)
Called patients mother back, Keith [name], she informed me that they are in an ambulance [facility] at the time of the call back.  States he was seen recently and he was prescribed prednisone for headaches.  States that his pain is so bad that he is yelling and screaming.  States headache began at time of a Lovenox injection.  Has had numerous xrays recently on neck and is also wondering if a CT will help as well.
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
## 2017-07-23 NOTE — Telephone Encounter (Signed)
Patient is crying due to pain from neck to top of head.  Caller very agitated and needs someone to call him back.

## 2017-07-23 NOTE — Discharge Instructions (Signed)
As we discussed, you need to follow-up with Dr. Val RilesNundkumar's office.  Call and arrange for an appointment tomorrow morning.  Continue taking your prednisone as directed.  Take your pain medications as directed.  Return to the emergency department for any fever, worsening headache, vision changes, difficulty moving neck, any other worsening or concerning symptoms.
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
## 2017-07-25 NOTE — Telephone Encounter (Signed)
Will do!

## 2017-07-25 NOTE — Telephone Encounter (Signed)
He was seen in ED with follow up with outpt NS recommended (appt 3/14). Findings of ED cervical CT noted. Let me know if he has any other needs after his NS appt. thx

## 2017-07-27 LAB — CULTURE, BLOOD (ROUTINE X 2)
Culture: NO GROWTH
Special Requests: ADEQUATE

## 2017-07-28 ENCOUNTER — Telehealth: Payer: Self-pay | Admitting: Internal Medicine

## 2017-07-28 ENCOUNTER — Telehealth: Payer: Self-pay | Admitting: *Deleted

## 2017-07-28 DIAGNOSIS — A499 Bacterial infection, unspecified: Secondary | ICD-10-CM

## 2017-07-28 DIAGNOSIS — N390 Urinary tract infection, site not specified: Principal | ICD-10-CM

## 2017-07-28 NOTE — Telephone Encounter (Signed)
Loura HaltStacy DAvid PT [facility] called to get vo orders for 2wk2 1wk1.  She also reported he had very high BP 180/110 and migraine, and no BM since Wednesday.  I called back but it was after hours and left VM that he should be seen by someone with that level of BP either PCP or ED.  It looks like he has been in and out of ED since discharge.   Looking further in chart I see that at discharge he was to [facility] and they called and got vo orders for PT poc.  I am not sure why [facility].  Stacy also called Dr Fabian SharpPanosh office (see note form telphone call today) and reported BP. I will call back in am to clarify [facility].

## 2017-07-28 NOTE — Telephone Encounter (Signed)
Copied from CRM [phone]. Topic: Inquiry >> Jul 28, 2017  9:20 AM Windy KalataMichael, Seaver Machia L, NT wrote: Kennyth ArnoldStacy is a PT calling from [facility], she seen the patient on Saturday 07/26/17 and states his BP was 180/110 and was complaining of migraines, states the patient BP was elevated on 07/25/17 as well when the nurse went out. Kennyth ArnoldStacy states that the patient has been in and out of the ED with the same symptoms. Stacy evaluated the patient on 07/26/17 and is requesting verbals for PT 2x a week for 2 weeks and then 1x a week for 1 week.    Kennyth ArnoldStacy [phone]

## 2017-07-28 NOTE — Telephone Encounter (Signed)
Per Dr. Fabian SharpPanosh: I  havent  seen pt  in 5 years     He  Is no longer an established patient. Any  Orders should  be sent to dr Fortino SicSwarz or the rehab team that is taking care of him.    Spoke with Stacy @ [facility] and advised her that we have not seen pt in 5 years and per Dr. Fabian SharpPanosh pt will need to re-establish care before we can sign any order. Suggested she contact Herm[facility] Medicus[facility] or rehab facility, [facility]. [facility].   Nothing further needed at this time.

## 2017-07-28 NOTE — Telephone Encounter (Signed)
Copied from CRM [phone]. Topic: Quick Communication - See Telephone Encounter >> Jul 28, 2017  9:58 AM Diana EvesHoyt, Maryann B wrote: CRM for notification. See Telephone encounter for:  Pt would like to reestablish with Dr. Fabian SharpPanosh last appt was in 2014.  07/28/17.
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
## 2017-07-29 NOTE — Telephone Encounter (Signed)
   [name], Keith [name], CMA 07/28/17 10:01 AM Note  Per Dr. Fabian SharpPanosh: I havent seen pt in 5 years He Is no longer an established patient. Any Orders should be sent to dr Fortino SicSwarz or the rehab team that is taking care of him.        Keith Fusshtyn M [name], CMA 07/29/17  11:03 AM Note  Appt was rescheduled and patient is establishing care with Keith RiegerKatherine Clark, Keith [name]  Visit Type: NEW PATIENT   07/31/17    Provider: Doreene Nestlark, [name], Keith [name] [facility]: [facility]  Referring Provider: Madelin HeadingsNOSH, Keith [name] CSN: [id number]  Notes: TRANSFER FROM DR PANOSH/CLE OK'D BY KATE       [facility]. Scheduled 07/31/17 with Keith RiegerKatherine Clark, Keith [name]  There is no call back for who left this message??

## 2017-07-29 NOTE — Telephone Encounter (Signed)
Why is patient/mother contacting primary about these issues??? We made it clear that we would be contacts for his SCI-related problems.   He has a complete SCI. He CANNOT empty his bowels and bladder voluntarily. Their emptying relies on a scheduled I/O cath program and a scheduled bowel program. We had established both of these on rehab! The problems is that we have a mother who likes to do things the way she wants to and Cam, himself, doesn't speak up on his own.   We need to find out IF family is still cathing as scheduled (to keep volumes in the 300-600cc range) and if he's attempting to maintain a nightly bowel program.  My guess is that he's not. If they're not maintaining these, what exactly are they doing?

## 2017-07-29 NOTE — Telephone Encounter (Signed)
Appt was rescheduled and patient is establishing care with Vernona RiegerKatherine Clark, NP  Visit Type: NEW PATIENT [[id number]]    Provider: Doreene Nestlark, Katherine K, NP Department: [facility]  Referring Provider: Madelin HeadingsNOSH, WANDA K CSN: [id number]  Notes: TRANSFER FROM DR PANOSH/CLE OK'D BY Jae DireKATE

## 2017-07-29 NOTE — Telephone Encounter (Addendum)
It was the Physical therapist form [facility] that contacted Dr Fabian SharpPanosh. I called his mother and she says that she has been giving the miralax every night and the senkot and suppository but she gave an enema today and his bowels moved.  She reports they are I&O cathing about every 3 1/2-4 hours. Still having headaches and tylenol not really helping.

## 2017-07-30 ENCOUNTER — Inpatient Hospital Stay: Payer: BLUE CROSS/BLUE SHIELD | Admitting: Internal Medicine

## 2017-07-30 NOTE — Telephone Encounter (Signed)
He's on gabapentin for nerve pain and headaches. Is he using this? Also he was prescribed pamelor for ptsd, sleep, pain/headache----he's no longer taking [facility]. Oxycodone was given to the patient also.    What has his blood pressure been running?Marland Kitchen. Honestly, these are issues that he needs to be seen in the office for. We shouldn't be taking a shot gun approach to them. There is a reasonable chance that he's having a dysreflexic episode. Other than taking his medications as recommended, and making sure his bowels are emptying nighty, I won't make any changes without seeing him

## 2017-07-31 ENCOUNTER — Encounter: Payer: Self-pay | Admitting: Primary Care

## 2017-07-31 ENCOUNTER — Telehealth: Payer: Self-pay | Admitting: *Deleted

## 2017-07-31 ENCOUNTER — Ambulatory Visit: Payer: BLUE CROSS/BLUE SHIELD | Admitting: Primary Care

## 2017-07-31 VITALS — BP 124/74 | HR 104 | Temp 98.2°F

## 2017-07-31 DIAGNOSIS — G8250 Quadriplegia, unspecified: Secondary | ICD-10-CM | POA: Diagnosis not present

## 2017-07-31 DIAGNOSIS — S14109S Unspecified injury at unspecified level of cervical spinal cord, sequela: Secondary | ICD-10-CM

## 2017-07-31 DIAGNOSIS — M542 Cervicalgia: Secondary | ICD-10-CM | POA: Diagnosis not present

## 2017-07-31 DIAGNOSIS — K592 Neurogenic bowel, not elsewhere classified: Secondary | ICD-10-CM | POA: Diagnosis not present

## 2017-07-31 DIAGNOSIS — N319 Neuromuscular dysfunction of bladder, unspecified: Secondary | ICD-10-CM | POA: Diagnosis not present

## 2017-07-31 MED ORDER — TRAMADOL HCL 50 MG PO TABS
50.0000 mg | ORAL_TABLET | Freq: Three times a day (TID) | ORAL | 0 refills | Status: DC | PRN
Start: 2017-07-31 — End: 2017-08-18

## 2017-07-31 NOTE — Telephone Encounter (Signed)
Bennetta LaosNancy Wilson, OT, [facility], left a message asking for verbal orders 2week1 folllowed by 3week2 folllowed by Wylie Hail2week2.  Verbal order given per office protocol
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
## 2017-07-31 NOTE — Patient Instructions (Signed)
Use the Tramadol only as needed for severe pain to the neck. Discuss pain management with the physical medicine doctor and neurosurgeon.  It was a pleasure to meet you today! Please don't hesitate to call or message me with any questions. [facility]!

## 2017-08-01 ENCOUNTER — Other Ambulatory Visit: Payer: Self-pay

## 2017-08-01 ENCOUNTER — Encounter: Payer: Self-pay | Admitting: Primary Care

## 2017-08-01 NOTE — Assessment & Plan Note (Signed)
Self catheterizing with family assistance.

## 2017-08-01 NOTE — Telephone Encounter (Signed)
He has appt Monday.

## 2017-08-01 NOTE — Assessment & Plan Note (Addendum)
Some gross motor function to upper extremities, no motor function to lower extremities. Does well in wheelchair. Continue home OT/PT. Rx for Tilt Table provided. Continue physical medicine follow up. Follow up with neurosurgery as scheduled.

## 2017-08-01 NOTE — Assessment & Plan Note (Signed)
On bowel regimen with family's assistance.
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
## 2017-08-04 ENCOUNTER — Telehealth: Payer: Self-pay

## 2017-08-04 ENCOUNTER — Encounter
Payer: BLUE CROSS/BLUE SHIELD | Attending: Physical Medicine & Rehabilitation | Admitting: Physical Medicine & Rehabilitation

## 2017-08-04 ENCOUNTER — Encounter: Payer: Self-pay | Admitting: Physical Medicine & Rehabilitation

## 2017-08-04 VITALS — BP 128/89 | HR 96 | Resp 14

## 2017-08-04 DIAGNOSIS — F4310 Post-traumatic stress disorder, unspecified: Secondary | ICD-10-CM

## 2017-08-04 DIAGNOSIS — Z981 Arthrodesis status: Secondary | ICD-10-CM | POA: Insufficient documentation

## 2017-08-04 DIAGNOSIS — R251 Tremor, unspecified: Secondary | ICD-10-CM | POA: Insufficient documentation

## 2017-08-04 DIAGNOSIS — Z79899 Other long term (current) drug therapy: Secondary | ICD-10-CM | POA: Insufficient documentation

## 2017-08-04 DIAGNOSIS — S14105D Unspecified injury at C5 level of cervical spinal cord, subsequent encounter: Secondary | ICD-10-CM | POA: Diagnosis not present

## 2017-08-04 DIAGNOSIS — R51 Headache: Secondary | ICD-10-CM | POA: Diagnosis not present

## 2017-08-04 DIAGNOSIS — R200 Anesthesia of skin: Secondary | ICD-10-CM | POA: Diagnosis not present

## 2017-08-04 DIAGNOSIS — F419 Anxiety disorder, unspecified: Secondary | ICD-10-CM | POA: Insufficient documentation

## 2017-08-04 DIAGNOSIS — G8220 Paraplegia, unspecified: Secondary | ICD-10-CM | POA: Diagnosis not present

## 2017-08-04 DIAGNOSIS — K592 Neurogenic bowel, not elsewhere classified: Secondary | ICD-10-CM | POA: Insufficient documentation

## 2017-08-04 DIAGNOSIS — N319 Neuromuscular dysfunction of bladder, unspecified: Secondary | ICD-10-CM | POA: Insufficient documentation

## 2017-08-04 DIAGNOSIS — E669 Obesity, unspecified: Secondary | ICD-10-CM | POA: Insufficient documentation

## 2017-08-04 DIAGNOSIS — N390 Urinary tract infection, site not specified: Secondary | ICD-10-CM

## 2017-08-04 DIAGNOSIS — G8250 Quadriplegia, unspecified: Secondary | ICD-10-CM | POA: Diagnosis not present

## 2017-08-04 DIAGNOSIS — Z8379 Family history of other diseases of the digestive system: Secondary | ICD-10-CM | POA: Insufficient documentation

## 2017-08-04 DIAGNOSIS — Z87828 Personal history of other (healed) physical injury and trauma: Secondary | ICD-10-CM | POA: Insufficient documentation

## 2017-08-04 DIAGNOSIS — W3400XD Accidental discharge from unspecified firearms or gun, subsequent encounter: Secondary | ICD-10-CM | POA: Diagnosis not present

## 2017-08-04 DIAGNOSIS — F17210 Nicotine dependence, cigarettes, uncomplicated: Secondary | ICD-10-CM | POA: Insufficient documentation

## 2017-08-04 DIAGNOSIS — A499 Bacterial infection, unspecified: Secondary | ICD-10-CM

## 2017-08-04 NOTE — Patient Instructions (Signed)
PLEASE FEEL FREE TO CALL OUR OFFICE WITH ANY PROBLEMS OR QUESTIONS ([phone])      
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
## 2017-08-04 NOTE — Telephone Encounter (Signed)
ERROR

## 2017-08-05 LAB — URINALYSIS
BILIRUBIN UA: NEGATIVE
GLUCOSE, UA: NEGATIVE
KETONES UA: NEGATIVE
Leukocytes, UA: NEGATIVE
Nitrite, UA: POSITIVE — AB
Protein, UA: NEGATIVE
SPEC GRAV UA: 1.02 (ref 1.005–1.030)
Urobilinogen, Ur: 1 mg/dL (ref 0.2–1.0)
pH, UA: 6 (ref 5.0–7.5)

## 2017-08-06 ENCOUNTER — Telehealth: Payer: Self-pay | Admitting: Physical Medicine & Rehabilitation

## 2017-08-06 LAB — URINE CULTURE

## 2017-08-06 MED ORDER — NITROFURANTOIN MONOHYD MACRO 100 MG PO CAPS: 100.0000 mg | ORAL_CAPSULE | Freq: Two times a day (BID) | ORAL | 0 refills | Status: DC

## 2017-08-06 NOTE — Telephone Encounter (Signed)
Sensitivity is still pending. That's why I have not sent rx in yet. I will as soon as it is back.   Also regarding tilt table. I spoke with Lizzy at [facility] (PT). We both decided that it was probably a bit too early to be using this at home. We think that working on sitting, weight shifting in w/c should be positional focus at this point.

## 2017-08-06 NOTE — Telephone Encounter (Signed)
Pt mom called wanting to know if the urine culture has come back from Monday so that they can get some meds for him.  Please advise...  Thanks, Rosezella FloridaLisa M.

## 2017-08-06 NOTE — Telephone Encounter (Signed)
The report is in epic. Please advise.

## 2017-08-06 NOTE — Telephone Encounter (Signed)
Notified Richardo's mother.

## 2017-08-15 ENCOUNTER — Emergency Department (HOSPITAL_COMMUNITY): Payer: BLUE CROSS/BLUE SHIELD

## 2017-08-15 ENCOUNTER — Encounter (HOSPITAL_COMMUNITY): Payer: Self-pay

## 2017-08-15 ENCOUNTER — Inpatient Hospital Stay (HOSPITAL_COMMUNITY)
Admission: EM | Admit: 2017-08-15 | Discharge: 2017-08-18 | DRG: 871 | Disposition: A | Discharge status: Home / Self Care | Payer: BLUE CROSS/BLUE SHIELD | Attending: Internal Medicine | Admitting: Internal Medicine

## 2017-08-15 ENCOUNTER — Other Ambulatory Visit: Payer: Self-pay

## 2017-08-15 DIAGNOSIS — R339 Retention of urine, unspecified: Secondary | ICD-10-CM | POA: Diagnosis present

## 2017-08-15 DIAGNOSIS — S14109D Unspecified injury at unspecified level of cervical spinal cord, subsequent encounter: Secondary | ICD-10-CM | POA: Diagnosis not present

## 2017-08-15 DIAGNOSIS — S14109A Unspecified injury at unspecified level of cervical spinal cord, initial encounter: Secondary | ICD-10-CM | POA: Diagnosis present

## 2017-08-15 DIAGNOSIS — L8990 Pressure ulcer of unspecified site, unspecified stage: Secondary | ICD-10-CM

## 2017-08-15 DIAGNOSIS — W3400XS Accidental discharge from unspecified firearms or gun, sequela: Secondary | ICD-10-CM | POA: Diagnosis not present

## 2017-08-15 DIAGNOSIS — A4151 Sepsis due to Escherichia coli [E. coli]: Secondary | ICD-10-CM | POA: Diagnosis not present

## 2017-08-15 DIAGNOSIS — Z720 Tobacco use: Secondary | ICD-10-CM | POA: Diagnosis present

## 2017-08-15 DIAGNOSIS — Z1639 Resistance to other specified antimicrobial drug: Secondary | ICD-10-CM | POA: Diagnosis present

## 2017-08-15 DIAGNOSIS — N319 Neuromuscular dysfunction of bladder, unspecified: Secondary | ICD-10-CM | POA: Diagnosis present

## 2017-08-15 DIAGNOSIS — R011 Cardiac murmur, unspecified: Secondary | ICD-10-CM | POA: Diagnosis present

## 2017-08-15 DIAGNOSIS — G8250 Quadriplegia, unspecified: Secondary | ICD-10-CM | POA: Diagnosis present

## 2017-08-15 DIAGNOSIS — B9620 Unspecified Escherichia coli [E. coli] as the cause of diseases classified elsewhere: Secondary | ICD-10-CM | POA: Diagnosis not present

## 2017-08-15 DIAGNOSIS — A499 Bacterial infection, unspecified: Secondary | ICD-10-CM | POA: Diagnosis present

## 2017-08-15 DIAGNOSIS — A4150 Gram-negative sepsis, unspecified: Principal | ICD-10-CM | POA: Diagnosis present

## 2017-08-15 DIAGNOSIS — L89153 Pressure ulcer of sacral region, stage 3: Secondary | ICD-10-CM | POA: Diagnosis present

## 2017-08-15 DIAGNOSIS — R6521 Severe sepsis with septic shock: Secondary | ICD-10-CM

## 2017-08-15 DIAGNOSIS — A419 Sepsis, unspecified organism: Secondary | ICD-10-CM | POA: Diagnosis present

## 2017-08-15 DIAGNOSIS — E669 Obesity, unspecified: Secondary | ICD-10-CM | POA: Diagnosis present

## 2017-08-15 DIAGNOSIS — N390 Urinary tract infection, site not specified: Secondary | ICD-10-CM | POA: Diagnosis present

## 2017-08-15 DIAGNOSIS — Z87440 Personal history of urinary (tract) infections: Secondary | ICD-10-CM | POA: Diagnosis not present

## 2017-08-15 DIAGNOSIS — R51 Headache: Secondary | ICD-10-CM | POA: Diagnosis present

## 2017-08-15 DIAGNOSIS — G4733 Obstructive sleep apnea (adult) (pediatric): Secondary | ICD-10-CM | POA: Diagnosis present

## 2017-08-15 DIAGNOSIS — L8943 Pressure ulcer of contiguous site of back, buttock and hip, stage 3: Secondary | ICD-10-CM | POA: Diagnosis not present

## 2017-08-15 DIAGNOSIS — S14109S Unspecified injury at unspecified level of cervical spinal cord, sequela: Secondary | ICD-10-CM

## 2017-08-15 DIAGNOSIS — B961 Klebsiella pneumoniae [K. pneumoniae] as the cause of diseases classified elsewhere: Secondary | ICD-10-CM | POA: Diagnosis present

## 2017-08-15 DIAGNOSIS — G8220 Paraplegia, unspecified: Secondary | ICD-10-CM | POA: Diagnosis present

## 2017-08-15 DIAGNOSIS — Z1611 Resistance to penicillins: Secondary | ICD-10-CM | POA: Diagnosis present

## 2017-08-15 DIAGNOSIS — R030 Elevated blood-pressure reading, without diagnosis of hypertension: Secondary | ICD-10-CM | POA: Diagnosis present

## 2017-08-15 DIAGNOSIS — M542 Cervicalgia: Secondary | ICD-10-CM

## 2017-08-15 DIAGNOSIS — F4310 Post-traumatic stress disorder, unspecified: Secondary | ICD-10-CM | POA: Diagnosis present

## 2017-08-15 HISTORY — DX: Urinary tract infection, site not specified: N39.0

## 2017-08-15 LAB — CBC WITH DIFFERENTIAL/PLATELET
Basophils Absolute: 0 10*3/uL (ref 0.0–0.1)
Basophils Relative: 0 %
EOS ABS: 0.3 10*3/uL (ref 0.0–0.7)
Eosinophils Relative: 3 %
HCT: 41.9 % (ref 39.0–52.0)
HEMOGLOBIN: 14 g/dL (ref 13.0–17.0)
LYMPHS ABS: 2.4 10*3/uL (ref 0.7–4.0)
LYMPHS PCT: 20 %
MCH: 27.9 pg (ref 26.0–34.0)
MCHC: 33.4 g/dL (ref 30.0–36.0)
MCV: 83.6 fL (ref 78.0–100.0)
MONOS PCT: 8 %
Monocytes Absolute: 1 10*3/uL (ref 0.1–1.0)
NEUTROS PCT: 69 %
Neutro Abs: 8.1 10*3/uL — ABNORMAL HIGH (ref 1.7–7.7)
Platelets: 366 10*3/uL (ref 150–400)
RBC: 5.01 MIL/uL (ref 4.22–5.81)
RDW: 13.4 % (ref 11.5–15.5)
WBC: 11.8 10*3/uL — AB (ref 4.0–10.5)

## 2017-08-15 LAB — URINALYSIS, ROUTINE W REFLEX MICROSCOPIC
Bilirubin Urine: NEGATIVE
GLUCOSE, UA: NEGATIVE mg/dL
KETONES UR: NEGATIVE mg/dL
Nitrite: NEGATIVE
PH: 9 — AB (ref 5.0–8.0)
PROTEIN: NEGATIVE mg/dL
Specific Gravity, Urine: 1.005 (ref 1.005–1.030)
Squamous Epithelial / LPF: NONE SEEN

## 2017-08-15 LAB — I-STAT CG4 LACTIC ACID, ED: Lactic Acid, Venous: 4.41 mmol/L (ref 0.5–1.9)

## 2017-08-15 LAB — COMPREHENSIVE METABOLIC PANEL
ALT: 97 U/L — ABNORMAL HIGH (ref 17–63)
AST: 50 U/L — ABNORMAL HIGH (ref 15–41)
Albumin: 4.1 g/dL (ref 3.5–5.0)
Alkaline Phosphatase: 108 U/L (ref 38–126)
Anion gap: 14 (ref 5–15)
BILIRUBIN TOTAL: 0.8 mg/dL (ref 0.3–1.2)
BUN: 8 mg/dL (ref 6–20)
CHLORIDE: 105 mmol/L (ref 101–111)
CO2: 22 mmol/L (ref 22–32)
Calcium: 10.7 mg/dL — ABNORMAL HIGH (ref 8.9–10.3)
Creatinine, Ser: 0.63 mg/dL (ref 0.61–1.24)
GFR calc Af Amer: >60 mL/min (ref >60)
Glucose, Bld: 80 mg/dL (ref 65–99)
POTASSIUM: 4.2 mmol/L (ref 3.5–5.1)
Sodium: 141 mmol/L (ref 135–145)
TOTAL PROTEIN: 8.9 g/dL — AB (ref 6.5–8.1)

## 2017-08-15 LAB — HEMOGLOBIN AND HEMATOCRIT, BLOOD
HEMATOCRIT: 36.3 % — AB (ref 39.0–52.0)
HEMOGLOBIN: 11.8 g/dL — AB (ref 13.0–17.0)

## 2017-08-15 LAB — PROCALCITONIN

## 2017-08-15 LAB — PROTIME-INR
INR: 1.17
PROTHROMBIN TIME: 14.8 s (ref 11.4–15.2)

## 2017-08-15 LAB — LACTIC ACID, PLASMA
LACTIC ACID, VENOUS: 1.4 mmol/L (ref 0.5–1.9)
LACTIC ACID, VENOUS: 1.6 mmol/L (ref 0.5–1.9)

## 2017-08-15 LAB — LIPASE, BLOOD: Lipase: 31 U/L (ref 11–51)

## 2017-08-15 LAB — APTT: aPTT: 38 seconds — ABNORMAL HIGH (ref 24–36)

## 2017-08-15 LAB — I-STAT TROPONIN, ED: Troponin i, poc: 0.02 ng/mL (ref 0.00–0.08)

## 2017-08-15 MED ORDER — ACETAMINOPHEN 325 MG PO TABS
650.0000 mg | ORAL_TABLET | Freq: Once | ORAL | Status: AC
  Administered 2017-08-15: 650 mg via ORAL
  Filled 2017-08-15: qty 2

## 2017-08-15 MED ORDER — GABAPENTIN 300 MG PO CAPS
300.0000 mg | ORAL_CAPSULE | Freq: Three times a day (TID) | ORAL | Status: DC
  Administered 2017-08-15 – 2017-08-18 (×9): 300 mg via ORAL
  Filled 2017-08-15 (×8): qty 1

## 2017-08-15 MED ORDER — SODIUM CHLORIDE 0.9 % IV SOLN
2.0000 g | INTRAVENOUS | Status: DC
  Administered 2017-08-16 – 2017-08-18 (×3): 2 g via INTRAVENOUS
  Filled 2017-08-15 (×3): qty 20

## 2017-08-15 MED ORDER — ADULT MULTIVITAMIN W/MINERALS CH
1.0000 | ORAL_TABLET | Freq: Every day | ORAL | Status: DC
  Administered 2017-08-15 – 2017-08-18 (×4): 1 via ORAL
  Filled 2017-08-15 (×4): qty 1

## 2017-08-15 MED ORDER — CYCLOBENZAPRINE HCL 5 MG PO TABS: 7.5000 mg | ORAL_TABLET | Freq: Three times a day (TID) | ORAL | Status: DC | PRN

## 2017-08-15 MED ORDER — IBUPROFEN 400 MG PO TABS: 400.0000 mg | ORAL_TABLET | Freq: Four times a day (QID) | ORAL | Status: DC | PRN

## 2017-08-15 MED ORDER — OXYCODONE HCL 5 MG PO TABS
5.0000 mg | ORAL_TABLET | ORAL | Status: DC | PRN
  Administered 2017-08-18: 10 mg via ORAL
  Filled 2017-08-15: qty 2

## 2017-08-15 MED ORDER — SODIUM CHLORIDE 0.9 % IV BOLUS (SEPSIS)
500.0000 mL | Freq: Once | INTRAVENOUS | Status: AC
  Administered 2017-08-15: 500 mL via INTRAVENOUS

## 2017-08-15 MED ORDER — BACLOFEN 5 MG HALF TABLET
5.0000 mg | ORAL_TABLET | Freq: Three times a day (TID) | ORAL | Status: DC | PRN
  Administered 2017-08-16 – 2017-08-18 (×6): 5 mg via ORAL
  Filled 2017-08-15 (×8): qty 1

## 2017-08-15 MED ORDER — SODIUM CHLORIDE 0.9 % IV BOLUS
1000.0000 mL | Freq: Once | INTRAVENOUS | Status: AC
  Administered 2017-08-15: 1000 mL via INTRAVENOUS

## 2017-08-15 MED ORDER — ENOXAPARIN SODIUM 60 MG/0.6ML ~~LOC~~ SOLN
60.0000 mg | SUBCUTANEOUS | Status: DC
  Administered 2017-08-16 – 2017-08-18 (×3): 60 mg via SUBCUTANEOUS
  Filled 2017-08-15 (×4): qty 0.6

## 2017-08-15 MED ORDER — NORTRIPTYLINE HCL 10 MG PO CAPS
10.0000 mg | ORAL_CAPSULE | Freq: Every day | ORAL | Status: DC
  Administered 2017-08-15 – 2017-08-17 (×2): 10 mg via ORAL
  Filled 2017-08-15 (×4): qty 1

## 2017-08-15 MED ORDER — SODIUM CHLORIDE 0.9 % IV BOLUS (SEPSIS)
1000.0000 mL | Freq: Once | INTRAVENOUS | Status: AC
  Administered 2017-08-15: 1000 mL via INTRAVENOUS

## 2017-08-15 MED ORDER — SODIUM CHLORIDE 0.9 % IV SOLN
INTRAVENOUS | Status: DC
  Administered 2017-08-15 – 2017-08-16 (×3): via INTRAVENOUS

## 2017-08-15 MED ORDER — BISACODYL 10 MG RE SUPP
10.0000 mg | RECTAL | Status: DC
  Administered 2017-08-15: 10 mg via RECTAL
  Filled 2017-08-15 (×3): qty 1

## 2017-08-15 MED ORDER — HYDROMORPHONE HCL 1 MG/ML IJ SOLN: 1.0000 mg | Freq: Once | INTRAMUSCULAR | Status: DC

## 2017-08-15 MED ORDER — ONDANSETRON HCL 4 MG/2ML IJ SOLN: 4.0000 mg | Freq: Four times a day (QID) | INTRAMUSCULAR | Status: DC | PRN

## 2017-08-15 MED ORDER — POLYETHYLENE GLYCOL 3350 17 G PO PACK
17.0000 g | PACK | Freq: Every day | ORAL | Status: DC
  Administered 2017-08-16 – 2017-08-18 (×3): 17 g via ORAL
  Filled 2017-08-15 (×3): qty 1

## 2017-08-15 MED ORDER — HYDRALAZINE HCL 20 MG/ML IJ SOLN: 10.0000 mg | Freq: Three times a day (TID) | INTRAMUSCULAR | Status: DC | PRN

## 2017-08-15 MED ORDER — FERROUS SULFATE 325 (65 FE) MG PO TABS
325.0000 mg | ORAL_TABLET | Freq: Every day | ORAL | Status: DC
  Filled 2017-08-15 (×2): qty 1

## 2017-08-15 MED ORDER — PANTOPRAZOLE SODIUM 40 MG PO TBEC
40.0000 mg | DELAYED_RELEASE_TABLET | Freq: Every day | ORAL | Status: DC
  Administered 2017-08-15 – 2017-08-18 (×4): 40 mg via ORAL
  Filled 2017-08-15 (×4): qty 1

## 2017-08-15 MED ORDER — TRAMADOL HCL 50 MG PO TABS
50.0000 mg | ORAL_TABLET | Freq: Three times a day (TID) | ORAL | Status: DC | PRN
  Administered 2017-08-18: 50 mg via ORAL
  Filled 2017-08-15: qty 1

## 2017-08-15 MED ORDER — ONDANSETRON HCL 4 MG PO TABS: 4.0000 mg | ORAL_TABLET | Freq: Four times a day (QID) | ORAL | Status: DC | PRN

## 2017-08-15 MED ORDER — ACETAMINOPHEN 325 MG PO TABS
650.0000 mg | ORAL_TABLET | Freq: Four times a day (QID) | ORAL | Status: DC | PRN
  Administered 2017-08-16 – 2017-08-18 (×3): 650 mg via ORAL
  Filled 2017-08-15 (×3): qty 2

## 2017-08-15 MED ORDER — SODIUM CHLORIDE 0.9 % IV SOLN
2.0000 g | Freq: Once | INTRAVENOUS | Status: AC
  Administered 2017-08-15: 2 g via INTRAVENOUS
  Filled 2017-08-15: qty 2

## 2017-08-15 NOTE — Progress Notes (Signed)
New Admission Note:  Arrival Method: Stretcher Mental Orientation: Alert and oriented x 4 Telemetry: Box 14 Assessment: Completed Skin: warm and dry, Tetraplegic Pressure injury to sacrum 7.5x 6cm, Lt buttock 2.5x1.5 cm IV: NSL x 2 Pain: denies Tubes: N/A Safety Measures: Safety Fall Prevention Plan initiated.  Admission: Completed 5 M  Orientation: Patient has been orientated to the room, unit and the staff. Family: Father   Orders have been reviewed and implemented. Will continue to monitor the patient. Call light has been placed within reach and bed alarm has been activated.   Guilford ShiEmmanuel Tondra Reierson BSN, RN  Phone Number: [phone]
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
## 2017-08-15 NOTE — ED Notes (Signed)
Phlebotomy at bedside to collect second set of blood cultures.

## 2017-08-15 NOTE — ED Notes (Signed)
Dinner tray ordered for pt

## 2017-08-15 NOTE — ED Notes (Signed)
Patient transported to CT 
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
## 2017-08-15 NOTE — ED Notes (Signed)
Portable chest xray at bedside.

## 2017-08-15 NOTE — ED Notes (Signed)
Phlebotomy at bedside to draw blood.  
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
## 2017-08-16 DIAGNOSIS — L8990 Pressure ulcer of unspecified site, unspecified stage: Secondary | ICD-10-CM

## 2017-08-16 DIAGNOSIS — A499 Bacterial infection, unspecified: Secondary | ICD-10-CM

## 2017-08-16 DIAGNOSIS — N390 Urinary tract infection, site not specified: Secondary | ICD-10-CM

## 2017-08-16 DIAGNOSIS — S14109D Unspecified injury at unspecified level of cervical spinal cord, subsequent encounter: Secondary | ICD-10-CM

## 2017-08-16 HISTORY — DX: Pressure ulcer of unspecified site, unspecified stage: L89.90

## 2017-08-16 LAB — GLUCOSE, CAPILLARY: Glucose-Capillary: 81 mg/dL (ref 65–99)

## 2017-08-16 LAB — BASIC METABOLIC PANEL
Anion gap: 9 (ref 5–15)
BUN: 8 mg/dL (ref 6–20)
CO2: 23 mmol/L (ref 22–32)
Calcium: 9 mg/dL (ref 8.9–10.3)
Chloride: 107 mmol/L (ref 101–111)
Creatinine, Ser: 0.64 mg/dL (ref 0.61–1.24)
GFR calc Af Amer: >60 mL/min (ref >60)
GLUCOSE: 90 mg/dL (ref 65–99)
Potassium: 3.9 mmol/L (ref 3.5–5.1)
SODIUM: 139 mmol/L (ref 135–145)

## 2017-08-16 LAB — CBC
HCT: 32.1 % — ABNORMAL LOW (ref 39.0–52.0)
Hemoglobin: 10.2 g/dL — ABNORMAL LOW (ref 13.0–17.0)
MCH: 27.4 pg (ref 26.0–34.0)
MCHC: 31.8 g/dL (ref 30.0–36.0)
MCV: 86.3 fL (ref 78.0–100.0)
Platelets: 294 10*3/uL (ref 150–400)
RBC: 3.72 MIL/uL — ABNORMAL LOW (ref 4.22–5.81)
RDW: 14 % (ref 11.5–15.5)
WBC: 6.8 10*3/uL (ref 4.0–10.5)
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
## 2017-08-16 NOTE — Progress Notes (Signed)
Patients mother gave home medications Neurontin and baclofen at 9am. Explained hospital policy regarding home medication and family agrees not to medicate Mr.Kelley and will take prescriptions home.

## 2017-08-16 NOTE — Progress Notes (Signed)
BP 89/44, P 100. Pt stated he felt his BP drop. Manual was taken and BP was 108/68. Made on call MD aware. Will continue to monitor.   Larey Dayshristy M Parisa Pinela, RN

## 2017-08-17 DIAGNOSIS — Z720 Tobacco use: Secondary | ICD-10-CM

## 2017-08-17 DIAGNOSIS — R011 Cardiac murmur, unspecified: Secondary | ICD-10-CM | POA: Diagnosis present

## 2017-08-17 LAB — BASIC METABOLIC PANEL
Anion gap: 8 (ref 5–15)
BUN: 9 mg/dL (ref 6–20)
CO2: 23 mmol/L (ref 22–32)
CREATININE: 0.62 mg/dL (ref 0.61–1.24)
Calcium: 9.2 mg/dL (ref 8.9–10.3)
Chloride: 108 mmol/L (ref 101–111)
GFR calc Af Amer: >60 mL/min (ref >60)
GLUCOSE: 84 mg/dL (ref 65–99)
Potassium: 4.3 mmol/L (ref 3.5–5.1)
SODIUM: 139 mmol/L (ref 135–145)

## 2017-08-17 LAB — BLOOD CULTURE ID PANEL (REFLEXED)
Acinetobacter baumannii: NOT DETECTED
CANDIDA ALBICANS: NOT DETECTED
CANDIDA GLABRATA: NOT DETECTED
CANDIDA PARAPSILOSIS: NOT DETECTED
CANDIDA TROPICALIS: NOT DETECTED
Candida krusei: NOT DETECTED
Enterobacter cloacae complex: NOT DETECTED
Enterobacteriaceae species: NOT DETECTED
Enterococcus species: NOT DETECTED
Escherichia coli: NOT DETECTED
HAEMOPHILUS INFLUENZAE: NOT DETECTED
KLEBSIELLA OXYTOCA: NOT DETECTED
KLEBSIELLA PNEUMONIAE: NOT DETECTED
Listeria monocytogenes: NOT DETECTED
METHICILLIN RESISTANCE: NOT DETECTED
NEISSERIA MENINGITIDIS: NOT DETECTED
PROTEUS SPECIES: NOT DETECTED
Pseudomonas aeruginosa: NOT DETECTED
STAPHYLOCOCCUS SPECIES: DETECTED — AB
STREPTOCOCCUS PYOGENES: NOT DETECTED
STREPTOCOCCUS SPECIES: NOT DETECTED
Serratia marcescens: NOT DETECTED
Staphylococcus aureus (BCID): NOT DETECTED
Streptococcus agalactiae: NOT DETECTED
Streptococcus pneumoniae: NOT DETECTED

## 2017-08-17 LAB — CBC WITH DIFFERENTIAL/PLATELET
Basophils Absolute: 0 10*3/uL (ref 0.0–0.1)
Basophils Relative: 0 %
EOS ABS: 0.3 10*3/uL (ref 0.0–0.7)
EOS PCT: 5 %
HCT: 33.1 % — ABNORMAL LOW (ref 39.0–52.0)
Hemoglobin: 10.5 g/dL — ABNORMAL LOW (ref 13.0–17.0)
LYMPHS ABS: 2.5 10*3/uL (ref 0.7–4.0)
LYMPHS PCT: 35 %
MCH: 27.1 pg (ref 26.0–34.0)
MCHC: 31.7 g/dL (ref 30.0–36.0)
MCV: 85.3 fL (ref 78.0–100.0)
MONO ABS: 1.3 10*3/uL — AB (ref 0.1–1.0)
Monocytes Relative: 19 %
Neutro Abs: 2.9 10*3/uL (ref 1.7–7.7)
Neutrophils Relative %: 41 %
PLATELETS: 292 10*3/uL (ref 150–400)
RBC: 3.88 MIL/uL — ABNORMAL LOW (ref 4.22–5.81)
RDW: 14 % (ref 11.5–15.5)
WBC: 7.1 10*3/uL (ref 4.0–10.5)

## 2017-08-17 LAB — MAGNESIUM: MAGNESIUM: 1.6 mg/dL — AB (ref 1.7–2.4)

## 2017-08-17 LAB — URINE CULTURE

## 2017-08-17 MED ORDER — MAGNESIUM SULFATE 4 GM/100ML IV SOLN
4.0000 g | Freq: Once | INTRAVENOUS | Status: AC
Start: 2017-08-17 — End: 2017-08-17
  Administered 2017-08-17: 4 g via INTRAVENOUS
  Filled 2017-08-17: qty 100
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
## 2017-08-17 NOTE — Progress Notes (Signed)
PHARMACY - PHYSICIAN COMMUNICATION CRITICAL VALUE ALERT - BLOOD CULTURE IDENTIFICATION (BCID)  Keith HarrisonCameron R [name] is an 22 y.o. male who [facility] to [facility]2017 with a chief complaint of sepsis and UTI.  Assessment: He is currently on Rocephin for sepsis due to UTI.  His urine culture is growing Klebsiella with sensitivities still pending.  He now has 1 blood culture bottle growing Gram positive cocci with Staph species detected on BCID. This is a coagulase-negative staph and is probably a contaminant. Recommend continuing Rocephin for his UTI pending sensitivities.  Name of physician (or Provider) Contacted: Dr. Janee Mornhompson via Loretha StaplerAmion  Current antibiotics: Ceftriaxone  Changes to prescribed antibiotics recommended:  Patient is on recommended antibiotics - No changes needed  Results for orders placed or performed during the hospital encounter of 08/15/17  Blood Culture ID Panel (Reflexed) (Collected: 08/15/2017  4:40 PM)  Result Value Ref Range   Enterococcus species NOT DETECTED NOT DETECTED   Listeria monocytogenes NOT DETECTED NOT DETECTED   Staphylococcus species DETECTED (A) NOT DETECTED   Staphylococcus aureus NOT DETECTED NOT DETECTED   Methicillin resistance NOT DETECTED NOT DETECTED   Streptococcus species NOT DETECTED NOT DETECTED   Streptococcus agalactiae NOT DETECTED NOT DETECTED   Streptococcus pneumoniae NOT DETECTED NOT DETECTED   Streptococcus pyogenes NOT DETECTED NOT DETECTED   Acinetobacter baumannii NOT DETECTED NOT DETECTED   Enterobacteriaceae species NOT DETECTED NOT DETECTED   Enterobacter cloacae complex NOT DETECTED NOT DETECTED   Escherichia coli NOT DETECTED NOT DETECTED   Klebsiella oxytoca NOT DETECTED NOT DETECTED   Klebsiella pneumoniae NOT DETECTED NOT DETECTED   Proteus species NOT DETECTED NOT DETECTED   Serratia marcescens NOT DETECTED NOT DETECTED   Haemophilus influenzae NOT DETECTED NOT DETECTED   Neisseria meningitidis NOT DETECTED NOT  DETECTED   Pseudomonas aeruginosa NOT DETECTED NOT DETECTED   Candida albicans NOT DETECTED NOT DETECTED   Candida glabrata NOT DETECTED NOT DETECTED   Candida krusei NOT DETECTED NOT DETECTED   Candida parapsilosis NOT DETECTED NOT DETECTED   Candida tropicalis NOT DETECTED NOT DETECTED    Sallee Provencalurner, Laverna Dossett S 08/17/2017  7:35 AM

## 2017-08-18 ENCOUNTER — Telehealth: Payer: Self-pay

## 2017-08-18 ENCOUNTER — Inpatient Hospital Stay (HOSPITAL_COMMUNITY): Payer: BLUE CROSS/BLUE SHIELD

## 2017-08-18 ENCOUNTER — Other Ambulatory Visit (HOSPITAL_COMMUNITY): Payer: BLUE CROSS/BLUE SHIELD

## 2017-08-18 DIAGNOSIS — A4151 Sepsis due to Escherichia coli [E. coli]: Secondary | ICD-10-CM

## 2017-08-18 DIAGNOSIS — R011 Cardiac murmur, unspecified: Secondary | ICD-10-CM

## 2017-08-18 DIAGNOSIS — L8943 Pressure ulcer of contiguous site of back, buttock and hip, stage 3: Secondary | ICD-10-CM

## 2017-08-18 DIAGNOSIS — B9620 Unspecified Escherichia coli [E. coli] as the cause of diseases classified elsewhere: Secondary | ICD-10-CM

## 2017-08-18 DIAGNOSIS — N390 Urinary tract infection, site not specified: Secondary | ICD-10-CM

## 2017-08-18 LAB — CBC
HCT: 31 % — ABNORMAL LOW (ref 39.0–52.0)
Hemoglobin: 9.9 g/dL — ABNORMAL LOW (ref 13.0–17.0)
MCH: 26.9 pg (ref 26.0–34.0)
MCHC: 31.9 g/dL (ref 30.0–36.0)
MCV: 84.2 fL (ref 78.0–100.0)
PLATELETS: 269 10*3/uL (ref 150–400)
RBC: 3.68 MIL/uL — AB (ref 4.22–5.81)
RDW: 13.9 % (ref 11.5–15.5)
WBC: 6.9 10*3/uL (ref 4.0–10.5)

## 2017-08-18 LAB — BASIC METABOLIC PANEL
ANION GAP: 9 (ref 5–15)
BUN: 9 mg/dL (ref 6–20)
CO2: 22 mmol/L (ref 22–32)
Calcium: 9 mg/dL (ref 8.9–10.3)
Chloride: 106 mmol/L (ref 101–111)
Creatinine, Ser: 0.56 mg/dL — ABNORMAL LOW (ref 0.61–1.24)
GFR calc Af Amer: >60 mL/min (ref >60)
GFR calc non Af Amer: >60 mL/min (ref >60)
Glucose, Bld: 88 mg/dL (ref 65–99)
POTASSIUM: 4.3 mmol/L (ref 3.5–5.1)
SODIUM: 137 mmol/L (ref 135–145)

## 2017-08-18 LAB — ECHOCARDIOGRAM COMPLETE
Height: 72 in
Weight: 4197.7 oz

## 2017-08-18 LAB — MAGNESIUM: Magnesium: 1.8 mg/dL (ref 1.7–2.4)

## 2017-08-18 MED ORDER — PANTOPRAZOLE SODIUM 40 MG PO TBEC: 40.0000 mg | DELAYED_RELEASE_TABLET | Freq: Every day | ORAL | 0 refills | Status: DC

## 2017-08-18 MED ORDER — BARRIER CREAM NON-SPECIFIED
1.0000 "application " | TOPICAL_CREAM | Freq: Two times a day (BID) | TOPICAL | Status: DC
  Filled 2017-08-18: qty 1

## 2017-08-18 MED ORDER — CEPHALEXIN 500 MG PO CAPS: 500.0000 mg | ORAL_CAPSULE | Freq: Two times a day (BID) | ORAL | Status: DC

## 2017-08-18 MED ORDER — ADULT MULTIVITAMIN W/MINERALS CH: 1.0000 | ORAL_TABLET | Freq: Every day | ORAL | Status: DC

## 2017-08-18 MED ORDER — TRAMADOL HCL 50 MG PO TABS: 50.0000 mg | ORAL_TABLET | Freq: Three times a day (TID) | ORAL | 0 refills | Status: DC | PRN

## 2017-08-18 MED ORDER — MAGNESIUM SULFATE 4 GM/100ML IV SOLN
4.0000 g | Freq: Once | INTRAVENOUS | Status: AC
  Administered 2017-08-18: 4 g via INTRAVENOUS
  Filled 2017-08-18: qty 100

## 2017-08-18 MED ORDER — SODIUM CHLORIDE 0.9 % IV SOLN: 1.0000 g | INTRAVENOUS | Status: DC

## 2017-08-18 MED ORDER — SENNOSIDES-DOCUSATE SODIUM 8.6-50 MG PO TABS
2.0000 | ORAL_TABLET | Freq: Every day | ORAL | Status: DC
  Filled 2017-08-18: qty 2

## 2017-08-18 MED ORDER — RESTORE BARRIER CREA: TOPICAL_CREAM | Freq: Two times a day (BID) | Status: DC

## 2017-08-18 MED ORDER — MUPIROCIN CALCIUM 2 % EX CREA
TOPICAL_CREAM | Freq: Every day | CUTANEOUS | Status: DC
  Filled 2017-08-18: qty 15

## 2017-08-18 MED ORDER — MUPIROCIN CALCIUM 2 % EX CREA: TOPICAL_CREAM | Freq: Every day | CUTANEOUS | 0 refills | Status: DC

## 2017-08-18 MED ORDER — CEPHALEXIN 500 MG PO CAPS: 500.0000 mg | ORAL_CAPSULE | Freq: Two times a day (BID) | ORAL | 0 refills | Status: AC

## 2017-08-18 NOTE — Progress Notes (Signed)
  Echocardiogram 2D Echocardiogram has been performed.  Keith [name] 08/18/2017, 12:12 PM

## 2017-08-18 NOTE — Telephone Encounter (Signed)
[facility] called and left an FYI stating that the patient while she was on site became diaphoretic and nauseous and presenting a BP of 280/140 along with HR of 115-120.  EMS were called and on seen at time of phone call.  [facility] is [facility] with a diagnosses of Septic Shock.

## 2017-08-18 NOTE — Progress Notes (Signed)
Patient IV removed. AVS, application of foley leg bag, and medications reviewed with patient and family. Family to assist patient with dressing and request assistance with transfer to his WC. Dondra SpryMoore, Alisabeth Selkirk Islee, RN

## 2017-08-18 NOTE — Consult Note (Addendum)
WOC Nurse wound consult note Reason for Consult: Consult requested for coccyx and left buttocks.  Family member at the bedside states wounds have been present "for awhile" and they are using Bactroban and dry dressings prior to admission.   Pt is frequently moist from urine when he does not have a foley(one is present at this time) and it is also difficult to keep the wound from becoming soiled with stool, (as it is currently) related to the close proximity to the rectum. Wound type: Coccyx/inner gluteal fold with stage 3 pressure injury; red and moist; 3X.2X.2cm, small amt tan drainage, no odor. Patchy areas of partial thickness skin loss across bilat buttocks; appearance consistent with moisture associated skin damage.  There is another stage 3 pressure injury which is located to left buttock and extends across to the other buttock; 3X7X.2cm, red and moist, small amt tan drainage, no odor. Pressure Injury POA: Yes Dressing procedure/placement/frequency: Pt is on a low airloss bed to reduce pressure.  Continue present plan of care with Bactroban and dressing to provide antimicrobial benefits and protect from further injury.  Discussed plan of care with patient and family member. Please re-consult if further assistance is needed.  Thank-you,  Cammie Mcgeeawn Jenea Dake MSN, RN, CWOCN, LyonsWCN-AP, CNS [phone]

## 2017-08-18 NOTE — Telephone Encounter (Signed)
Harriett [facility]Nancy OT [facility], stated is needing some paperwork to be completed that was sent over in reguards of a power wheelchair for him.  In addition she is also asking, depending the outcome of the current admission, that he be transitioned to Outpatient PT and OT as well and is in need of orders for that as well.
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
## 2017-08-19 ENCOUNTER — Telehealth: Payer: Self-pay

## 2017-08-19 LAB — CULTURE, BLOOD (ROUTINE X 2): Special Requests: ADEQUATE

## 2017-08-19 NOTE — Telephone Encounter (Signed)
[facility] [phone] called requesting orders for nursing, PT, OT for patient that was discharged yesterday.

## 2017-08-19 NOTE — Telephone Encounter (Signed)
Unsure if we are still witting the orders for this patient as they were recently hospitalized and discharged from a different doctor.  Please advse

## 2017-08-20 LAB — CULTURE, BLOOD (ROUTINE X 2)
Culture: NO GROWTH
SPECIAL REQUESTS: ADEQUATE

## 2017-08-20 NOTE — Telephone Encounter (Signed)
[facility] orders as well, [phone]

## 2017-08-21 NOTE — Telephone Encounter (Signed)
Please resume.

## 2017-08-21 NOTE — Telephone Encounter (Signed)
Called back nurse and therapists and gave orders to resume treatments.

## 2017-08-22 ENCOUNTER — Telehealth: Payer: Self-pay

## 2017-08-22 NOTE — Telephone Encounter (Signed)
[facility] [place] called with an fyi that pt was not available to see today but is schedule to see pt Monday.

## 2017-08-28 ENCOUNTER — Telehealth: Payer: Self-pay

## 2017-08-28 ENCOUNTER — Telehealth: Payer: Self-pay | Admitting: *Deleted

## 2017-08-28 NOTE — Telephone Encounter (Signed)
[facility] for pt resumption. [phone]

## 2017-08-28 NOTE — Telephone Encounter (Signed)
Shire[facility] miller, [facility], [facility] message asking for verbal orders for HHOT 2week4 to address upper body strength, feeding, and grooming.  She called back this morning before this message could be addressed. Verbal orders given per office protocol

## 2017-08-29 ENCOUNTER — Telehealth: Payer: Self-pay | Admitting: Primary Care

## 2017-08-29 NOTE — Telephone Encounter (Signed)
That's fine
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
## 2017-09-01 NOTE — Telephone Encounter (Signed)
Approved.  

## 2017-09-01 NOTE — Telephone Encounter (Signed)
Lauren notified.

## 2017-09-02 ENCOUNTER — Telehealth: Payer: Self-pay | Admitting: *Deleted

## 2017-09-02 DIAGNOSIS — G8220 Paraplegia, unspecified: Secondary | ICD-10-CM

## 2017-09-02 DIAGNOSIS — S14109S Unspecified injury at unspecified level of cervical spinal cord, sequela: Secondary | ICD-10-CM

## 2017-09-02 NOTE — Telephone Encounter (Signed)
Message left for Stacey to return my call.

## 2017-09-02 NOTE — Telephone Encounter (Signed)
Patients mother is asking if we could place a referral to outpatient OT/PT therapies.  They feel it is too difficult to keep up with home health services and would like the structure of appointments and visits to outpatient therapies

## 2017-09-03 NOTE — Telephone Encounter (Signed)
Notified patient's mother.

## 2017-09-03 NOTE — Telephone Encounter (Signed)
Referrals made to [facility]

## 2017-09-03 NOTE — Telephone Encounter (Signed)
Spoken to TwilightStacey and gave the approval of the verbal orders.

## 2017-09-10 ENCOUNTER — Telehealth: Payer: Self-pay

## 2017-09-10 NOTE — Telephone Encounter (Signed)
[facility] called today, stated patient was unable to attend a visit today so she has requested 1 additional visit for the week after next (may 12-18) to make up for the missed visit.  Called her back and approved additional visit

## 2017-09-11 ENCOUNTER — Telehealth: Payer: Self-pay | Admitting: *Deleted

## 2017-09-11 NOTE — Telephone Encounter (Signed)
Jawan's father showed up at window stating he needed urinary catheters.  He has been seen by [facility] on 08/27/17 and we directed him to contact them for his catheter needs.

## 2017-09-17 ENCOUNTER — Telehealth: Payer: Self-pay

## 2017-09-17 NOTE — Telephone Encounter (Signed)
Contacted [facility] and left message for nurse to continue visits.

## 2017-09-17 NOTE — Telephone Encounter (Signed)
Please continue .

## 2017-09-17 NOTE — Telephone Encounter (Signed)
Home health RN from [facility] called, stated that today is the last day as it is currently written on his orders for home health nursing, wanting to know if we wish to continue them.  Please advise.

## 2017-09-19 ENCOUNTER — Telehealth: Payer: Self-pay

## 2017-09-19 NOTE — Telephone Encounter (Signed)
Stacey-PT called stating she had her last visit with patient and discharged him on the contingence of him going to outpatient. She wants to make sure that referral was done and to where will he be going.

## 2017-09-19 NOTE — Telephone Encounter (Signed)
Referral was done by Dr Riley Kill. Stacy  Notified.

## 2017-09-26 ENCOUNTER — Telehealth: Payer: Self-pay | Admitting: *Deleted

## 2017-09-26 ENCOUNTER — Telehealth: Payer: Self-pay
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
## 2017-09-26 NOTE — Telephone Encounter (Signed)
Ke[facility] [name] called to notify Dr Mosie Epstein is signing off since he is going to outpt rehab now.

## 2017-09-29 ENCOUNTER — Ambulatory Visit: Payer: BLUE CROSS/BLUE SHIELD | Attending: Physical Medicine & Rehabilitation | Admitting: Physical Therapy

## 2017-09-29 ENCOUNTER — Ambulatory Visit: Payer: BLUE CROSS/BLUE SHIELD | Admitting: Occupational Therapy

## 2017-09-29 ENCOUNTER — Encounter: Payer: Self-pay | Admitting: Physical Therapy

## 2017-09-29 DIAGNOSIS — G8254 Quadriplegia, C5-C7 incomplete: Secondary | ICD-10-CM | POA: Diagnosis present

## 2017-09-29 DIAGNOSIS — R208 Other disturbances of skin sensation: Secondary | ICD-10-CM | POA: Insufficient documentation

## 2017-09-29 DIAGNOSIS — M6281 Muscle weakness (generalized): Secondary | ICD-10-CM

## 2017-09-29 DIAGNOSIS — R293 Abnormal posture: Secondary | ICD-10-CM | POA: Diagnosis present

## 2017-09-29 DIAGNOSIS — M25612 Stiffness of left shoulder, not elsewhere classified: Secondary | ICD-10-CM | POA: Diagnosis present

## 2017-09-29 NOTE — Telephone Encounter (Signed)
Message left for Keith [name] to return my call. 

## 2017-09-29 NOTE — Telephone Encounter (Signed)
I have not seen patient since hospital stay. He is visited by home health often who can provide an update. He will also be seeing physical medicine tomorrow.

## 2017-09-30 ENCOUNTER — Encounter
Payer: BLUE CROSS/BLUE SHIELD | Attending: Physical Medicine & Rehabilitation | Admitting: Physical Medicine & Rehabilitation

## 2017-09-30 DIAGNOSIS — F4310 Post-traumatic stress disorder, unspecified: Secondary | ICD-10-CM | POA: Insufficient documentation

## 2017-09-30 DIAGNOSIS — F419 Anxiety disorder, unspecified: Secondary | ICD-10-CM | POA: Insufficient documentation

## 2017-09-30 DIAGNOSIS — G8250 Quadriplegia, unspecified: Secondary | ICD-10-CM | POA: Insufficient documentation

## 2017-09-30 DIAGNOSIS — Z981 Arthrodesis status: Secondary | ICD-10-CM | POA: Insufficient documentation

## 2017-09-30 DIAGNOSIS — R200 Anesthesia of skin: Secondary | ICD-10-CM | POA: Insufficient documentation

## 2017-09-30 DIAGNOSIS — E669 Obesity, unspecified: Secondary | ICD-10-CM | POA: Insufficient documentation

## 2017-09-30 DIAGNOSIS — F17210 Nicotine dependence, cigarettes, uncomplicated: Secondary | ICD-10-CM | POA: Insufficient documentation

## 2017-09-30 DIAGNOSIS — N319 Neuromuscular dysfunction of bladder, unspecified: Secondary | ICD-10-CM | POA: Insufficient documentation

## 2017-09-30 DIAGNOSIS — K592 Neurogenic bowel, not elsewhere classified: Secondary | ICD-10-CM | POA: Insufficient documentation

## 2017-09-30 DIAGNOSIS — R51 Headache: Secondary | ICD-10-CM | POA: Insufficient documentation

## 2017-09-30 DIAGNOSIS — Z79899 Other long term (current) drug therapy: Secondary | ICD-10-CM | POA: Insufficient documentation

## 2017-09-30 DIAGNOSIS — R251 Tremor, unspecified: Secondary | ICD-10-CM | POA: Insufficient documentation

## 2017-09-30 DIAGNOSIS — Z87828 Personal history of other (healed) physical injury and trauma: Secondary | ICD-10-CM | POA: Insufficient documentation

## 2017-09-30 DIAGNOSIS — Z8379 Family history of other diseases of the digestive system: Secondary | ICD-10-CM | POA: Insufficient documentation

## 2017-09-30 DIAGNOSIS — S14105D Unspecified injury at C5 level of cervical spinal cord, subsequent encounter: Secondary | ICD-10-CM | POA: Insufficient documentation

## 2017-10-01 ENCOUNTER — Encounter: Payer: Self-pay | Admitting: Physical Therapy

## 2017-10-01 NOTE — Telephone Encounter (Signed)
Message left for Aurther Loft to return my call.
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
## 2017-10-03 NOTE — Telephone Encounter (Signed)
Message left for Terry to return my call. 

## 2017-10-07 ENCOUNTER — Other Ambulatory Visit: Payer: Self-pay

## 2017-10-07 ENCOUNTER — Encounter: Payer: Self-pay | Admitting: Occupational Therapy

## 2017-10-07 ENCOUNTER — Ambulatory Visit: Payer: BLUE CROSS/BLUE SHIELD | Admitting: Occupational Therapy

## 2017-10-07 DIAGNOSIS — M6281 Muscle weakness (generalized): Secondary | ICD-10-CM | POA: Diagnosis not present

## 2017-10-07 DIAGNOSIS — M25612 Stiffness of left shoulder, not elsewhere classified: Secondary | ICD-10-CM

## 2017-10-07 DIAGNOSIS — R293 Abnormal posture: Secondary | ICD-10-CM

## 2017-10-07 DIAGNOSIS — G8254 Quadriplegia, C5-C7 incomplete: Secondary | ICD-10-CM

## 2017-10-07 DIAGNOSIS — R208 Other disturbances of skin sensation: Secondary | ICD-10-CM
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
## 2017-10-09 ENCOUNTER — Ambulatory Visit: Payer: BLUE CROSS/BLUE SHIELD | Admitting: Rehabilitation

## 2017-10-10 ENCOUNTER — Encounter: Payer: Self-pay | Admitting: Rehabilitation

## 2017-10-10 ENCOUNTER — Ambulatory Visit: Payer: BLUE CROSS/BLUE SHIELD | Admitting: Rehabilitation

## 2017-10-10 DIAGNOSIS — M6281 Muscle weakness (generalized): Secondary | ICD-10-CM | POA: Diagnosis not present

## 2017-10-10 DIAGNOSIS — G8254 Quadriplegia, C5-C7 incomplete: Secondary | ICD-10-CM

## 2017-10-10 DIAGNOSIS — R208 Other disturbances of skin sensation: Secondary | ICD-10-CM

## 2017-10-10 DIAGNOSIS — R293 Abnormal posture: Secondary | ICD-10-CM
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
## 2017-10-13 ENCOUNTER — Ambulatory Visit: Payer: BLUE CROSS/BLUE SHIELD | Attending: Physical Medicine & Rehabilitation | Admitting: Physical Therapy

## 2017-10-13 DIAGNOSIS — M25612 Stiffness of left shoulder, not elsewhere classified: Secondary | ICD-10-CM | POA: Insufficient documentation

## 2017-10-13 DIAGNOSIS — R208 Other disturbances of skin sensation: Secondary | ICD-10-CM | POA: Insufficient documentation

## 2017-10-13 DIAGNOSIS — G8254 Quadriplegia, C5-C7 incomplete: Secondary | ICD-10-CM | POA: Insufficient documentation

## 2017-10-13 DIAGNOSIS — M6281 Muscle weakness (generalized): Secondary | ICD-10-CM | POA: Insufficient documentation

## 2017-10-13 DIAGNOSIS — R293 Abnormal posture: Secondary | ICD-10-CM | POA: Insufficient documentation

## 2017-10-13 DIAGNOSIS — G8250 Quadriplegia, unspecified: Secondary | ICD-10-CM | POA: Insufficient documentation

## 2017-10-14 ENCOUNTER — Ambulatory Visit: Payer: BLUE CROSS/BLUE SHIELD | Admitting: Occupational Therapy

## 2017-10-14 ENCOUNTER — Encounter: Payer: Self-pay | Admitting: Occupational Therapy

## 2017-10-14 DIAGNOSIS — R208 Other disturbances of skin sensation: Secondary | ICD-10-CM | POA: Diagnosis present

## 2017-10-14 DIAGNOSIS — R293 Abnormal posture: Secondary | ICD-10-CM

## 2017-10-14 DIAGNOSIS — M25612 Stiffness of left shoulder, not elsewhere classified: Secondary | ICD-10-CM

## 2017-10-14 DIAGNOSIS — G8250 Quadriplegia, unspecified: Secondary | ICD-10-CM | POA: Diagnosis present

## 2017-10-14 DIAGNOSIS — M6281 Muscle weakness (generalized): Secondary | ICD-10-CM

## 2017-10-14 DIAGNOSIS — G8254 Quadriplegia, C5-C7 incomplete: Secondary | ICD-10-CM | POA: Diagnosis present
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
## 2017-10-16 ENCOUNTER — Ambulatory Visit: Payer: BLUE CROSS/BLUE SHIELD | Admitting: Physical Therapy

## 2017-10-16 ENCOUNTER — Ambulatory Visit: Payer: BLUE CROSS/BLUE SHIELD | Admitting: Occupational Therapy

## 2017-10-16 ENCOUNTER — Encounter: Payer: Self-pay | Admitting: Occupational Therapy

## 2017-10-16 DIAGNOSIS — M6281 Muscle weakness (generalized): Secondary | ICD-10-CM

## 2017-10-16 DIAGNOSIS — R208 Other disturbances of skin sensation: Secondary | ICD-10-CM

## 2017-10-16 DIAGNOSIS — G8254 Quadriplegia, C5-C7 incomplete: Secondary | ICD-10-CM

## 2017-10-16 DIAGNOSIS — R293 Abnormal posture: Secondary | ICD-10-CM

## 2017-10-16 DIAGNOSIS — M25612 Stiffness of left shoulder, not elsewhere classified: Secondary | ICD-10-CM
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
## 2017-10-16 NOTE — Patient Instructions (Signed)
Arm Exercises:  You are doing about 6 exercises in your chair. Try holding the position for a count of 5 and do the exercises slowly.  Alternated these exercises - do the first three one day then the other three next day. Currently you are doing 10 reps x2 about 3-4 times per day which is good as long as you don't get pain.   1.  Bicep curls - 3 pounds  2. Alternating chest presses - 3 pounds with Left arm, 2 pounds with right arm. MAKE SURE YOU GET THE ELBOW ALL THE WAY STRAIGHT.   3.  Arms out to the side, elbows straight - 2 pounds 4. Theraband around the back of your chair - use your blue IF you can truly get your elbows straight.  If you can't then use a lighter resistance band. 5. Theraband under your feet - bicep curls blue 6. Theraband under your feet - shoulder flexion (raising your arm up).  Make sure you keep your elbows straight.  If you can't then use a lighter resistance band.    Laying in bed: +

## 2017-10-17 ENCOUNTER — Encounter: Payer: Self-pay | Admitting: Physical Therapy
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
## 2017-10-21 ENCOUNTER — Ambulatory Visit: Payer: BLUE CROSS/BLUE SHIELD | Admitting: Occupational Therapy

## 2017-10-21 ENCOUNTER — Encounter: Payer: Self-pay | Admitting: Physical Therapy

## 2017-10-21 ENCOUNTER — Ambulatory Visit: Payer: BLUE CROSS/BLUE SHIELD | Admitting: Physical Therapy

## 2017-10-21 ENCOUNTER — Encounter: Payer: Self-pay | Admitting: Occupational Therapy

## 2017-10-21 DIAGNOSIS — R208 Other disturbances of skin sensation: Secondary | ICD-10-CM

## 2017-10-21 DIAGNOSIS — R293 Abnormal posture: Secondary | ICD-10-CM

## 2017-10-21 DIAGNOSIS — G8254 Quadriplegia, C5-C7 incomplete: Secondary | ICD-10-CM

## 2017-10-21 DIAGNOSIS — M25612 Stiffness of left shoulder, not elsewhere classified: Secondary | ICD-10-CM

## 2017-10-21 DIAGNOSIS — M6281 Muscle weakness (generalized): Secondary | ICD-10-CM

## 2017-10-21 DIAGNOSIS — G8250 Quadriplegia, unspecified: Secondary | ICD-10-CM
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
## 2017-10-22 ENCOUNTER — Ambulatory Visit: Payer: BLUE CROSS/BLUE SHIELD | Admitting: Physical Therapy

## 2017-10-22 ENCOUNTER — Encounter: Payer: Self-pay | Admitting: Physical Therapy

## 2017-10-22 ENCOUNTER — Ambulatory Visit: Payer: BLUE CROSS/BLUE SHIELD | Admitting: Occupational Therapy

## 2017-10-22 ENCOUNTER — Encounter: Payer: Self-pay | Admitting: Occupational Therapy

## 2017-10-22 DIAGNOSIS — M6281 Muscle weakness (generalized): Secondary | ICD-10-CM

## 2017-10-22 DIAGNOSIS — M25612 Stiffness of left shoulder, not elsewhere classified: Secondary | ICD-10-CM

## 2017-10-22 DIAGNOSIS — R293 Abnormal posture: Secondary | ICD-10-CM

## 2017-10-22 DIAGNOSIS — G8254 Quadriplegia, C5-C7 incomplete: Secondary | ICD-10-CM

## 2017-10-22 DIAGNOSIS — G8250 Quadriplegia, unspecified: Secondary | ICD-10-CM

## 2017-10-22 DIAGNOSIS — R208 Other disturbances of skin sensation: Secondary | ICD-10-CM
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
## 2017-10-27 ENCOUNTER — Ambulatory Visit: Payer: BLUE CROSS/BLUE SHIELD | Admitting: Rehabilitation

## 2017-10-27 ENCOUNTER — Ambulatory Visit: Payer: BLUE CROSS/BLUE SHIELD | Admitting: Occupational Therapy

## 2017-10-29 ENCOUNTER — Ambulatory Visit: Payer: BLUE CROSS/BLUE SHIELD | Admitting: Occupational Therapy

## 2017-10-29 ENCOUNTER — Ambulatory Visit: Payer: BLUE CROSS/BLUE SHIELD | Admitting: Physical Therapy

## 2017-10-29 ENCOUNTER — Encounter: Payer: Self-pay | Admitting: Occupational Therapy

## 2017-10-29 ENCOUNTER — Encounter: Payer: Self-pay | Admitting: Physical Therapy

## 2017-10-29 DIAGNOSIS — R293 Abnormal posture: Secondary | ICD-10-CM

## 2017-10-29 DIAGNOSIS — R208 Other disturbances of skin sensation: Secondary | ICD-10-CM

## 2017-10-29 DIAGNOSIS — M6281 Muscle weakness (generalized): Secondary | ICD-10-CM | POA: Diagnosis not present

## 2017-10-29 DIAGNOSIS — G8254 Quadriplegia, C5-C7 incomplete: Secondary | ICD-10-CM

## 2017-10-29 DIAGNOSIS — M25612 Stiffness of left shoulder, not elsewhere classified: Secondary | ICD-10-CM
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
## 2017-11-03 ENCOUNTER — Encounter: Payer: Self-pay | Admitting: Occupational Therapy

## 2017-11-03 ENCOUNTER — Ambulatory Visit: Payer: BLUE CROSS/BLUE SHIELD | Admitting: Physical Therapy

## 2017-11-03 ENCOUNTER — Ambulatory Visit: Payer: BLUE CROSS/BLUE SHIELD | Admitting: Occupational Therapy

## 2017-11-03 ENCOUNTER — Encounter: Payer: Self-pay | Admitting: Physical Therapy

## 2017-11-03 DIAGNOSIS — M25612 Stiffness of left shoulder, not elsewhere classified: Secondary | ICD-10-CM

## 2017-11-03 DIAGNOSIS — G8254 Quadriplegia, C5-C7 incomplete: Secondary | ICD-10-CM

## 2017-11-03 DIAGNOSIS — M6281 Muscle weakness (generalized): Secondary | ICD-10-CM | POA: Diagnosis not present

## 2017-11-03 DIAGNOSIS — R293 Abnormal posture: Secondary | ICD-10-CM

## 2017-11-03 DIAGNOSIS — R208 Other disturbances of skin sensation: Secondary | ICD-10-CM
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
## 2017-11-06 ENCOUNTER — Encounter: Payer: Self-pay | Admitting: Occupational Therapy

## 2017-11-06 ENCOUNTER — Ambulatory Visit: Payer: BLUE CROSS/BLUE SHIELD | Admitting: Occupational Therapy

## 2017-11-06 ENCOUNTER — Ambulatory Visit: Payer: BLUE CROSS/BLUE SHIELD | Admitting: Physical Therapy

## 2017-11-06 DIAGNOSIS — R293 Abnormal posture: Secondary | ICD-10-CM

## 2017-11-06 DIAGNOSIS — R208 Other disturbances of skin sensation: Secondary | ICD-10-CM

## 2017-11-06 DIAGNOSIS — M6281 Muscle weakness (generalized): Secondary | ICD-10-CM

## 2017-11-06 DIAGNOSIS — G8254 Quadriplegia, C5-C7 incomplete: Secondary | ICD-10-CM

## 2017-11-06 DIAGNOSIS — M25612 Stiffness of left shoulder, not elsewhere classified: Secondary | ICD-10-CM
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
## 2017-11-07 ENCOUNTER — Encounter: Payer: Self-pay | Admitting: Physical Therapy
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
## 2017-11-10 ENCOUNTER — Encounter: Payer: Self-pay | Admitting: Rehabilitation

## 2017-11-10 ENCOUNTER — Ambulatory Visit: Payer: BLUE CROSS/BLUE SHIELD | Attending: Physical Medicine & Rehabilitation | Admitting: Rehabilitation

## 2017-11-10 ENCOUNTER — Ambulatory Visit: Payer: BLUE CROSS/BLUE SHIELD | Admitting: Occupational Therapy

## 2017-11-10 ENCOUNTER — Encounter: Payer: Self-pay | Admitting: Occupational Therapy

## 2017-11-10 DIAGNOSIS — R293 Abnormal posture: Secondary | ICD-10-CM | POA: Diagnosis present

## 2017-11-10 DIAGNOSIS — M6281 Muscle weakness (generalized): Secondary | ICD-10-CM | POA: Diagnosis not present

## 2017-11-10 DIAGNOSIS — G8254 Quadriplegia, C5-C7 incomplete: Secondary | ICD-10-CM | POA: Insufficient documentation

## 2017-11-10 DIAGNOSIS — M25612 Stiffness of left shoulder, not elsewhere classified: Secondary | ICD-10-CM | POA: Diagnosis present

## 2017-11-10 DIAGNOSIS — R208 Other disturbances of skin sensation: Secondary | ICD-10-CM
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
## 2017-11-14 ENCOUNTER — Ambulatory Visit: Payer: BLUE CROSS/BLUE SHIELD | Admitting: Rehabilitation

## 2017-11-14 ENCOUNTER — Encounter: Payer: BLUE CROSS/BLUE SHIELD | Admitting: Occupational Therapy

## 2017-11-17 ENCOUNTER — Ambulatory Visit: Payer: BLUE CROSS/BLUE SHIELD | Admitting: Occupational Therapy

## 2017-11-17 ENCOUNTER — Ambulatory Visit: Payer: BLUE CROSS/BLUE SHIELD | Admitting: Rehabilitation

## 2017-11-17 ENCOUNTER — Encounter: Payer: Self-pay | Admitting: Occupational Therapy

## 2017-11-17 ENCOUNTER — Encounter: Payer: Self-pay | Admitting: Rehabilitation

## 2017-11-17 DIAGNOSIS — M6281 Muscle weakness (generalized): Secondary | ICD-10-CM | POA: Diagnosis not present

## 2017-11-17 DIAGNOSIS — R293 Abnormal posture: Secondary | ICD-10-CM

## 2017-11-17 DIAGNOSIS — M25612 Stiffness of left shoulder, not elsewhere classified: Secondary | ICD-10-CM

## 2017-11-17 DIAGNOSIS — G8254 Quadriplegia, C5-C7 incomplete: Secondary | ICD-10-CM

## 2017-11-17 DIAGNOSIS — R208 Other disturbances of skin sensation: Secondary | ICD-10-CM
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
## 2017-11-20 ENCOUNTER — Ambulatory Visit: Payer: BLUE CROSS/BLUE SHIELD | Admitting: Occupational Therapy

## 2017-11-20 ENCOUNTER — Encounter: Payer: Self-pay | Admitting: Rehabilitation

## 2017-11-20 ENCOUNTER — Ambulatory Visit: Payer: BLUE CROSS/BLUE SHIELD | Admitting: Rehabilitation

## 2017-11-20 DIAGNOSIS — M6281 Muscle weakness (generalized): Secondary | ICD-10-CM | POA: Diagnosis not present

## 2017-11-20 DIAGNOSIS — R293 Abnormal posture: Secondary | ICD-10-CM
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
## 2017-11-24 ENCOUNTER — Encounter: Payer: Self-pay | Admitting: Occupational Therapy

## 2017-11-24 ENCOUNTER — Ambulatory Visit: Payer: BLUE CROSS/BLUE SHIELD | Admitting: Rehabilitation

## 2017-11-24 ENCOUNTER — Ambulatory Visit: Payer: BLUE CROSS/BLUE SHIELD | Admitting: Occupational Therapy

## 2017-11-24 DIAGNOSIS — R293 Abnormal posture: Secondary | ICD-10-CM

## 2017-11-24 DIAGNOSIS — M25612 Stiffness of left shoulder, not elsewhere classified: Secondary | ICD-10-CM

## 2017-11-24 DIAGNOSIS — M6281 Muscle weakness (generalized): Secondary | ICD-10-CM

## 2017-11-24 DIAGNOSIS — G8254 Quadriplegia, C5-C7 incomplete: Secondary | ICD-10-CM

## 2017-11-24 DIAGNOSIS — R208 Other disturbances of skin sensation: Secondary | ICD-10-CM
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
## 2017-11-25 ENCOUNTER — Ambulatory Visit: Payer: BLUE CROSS/BLUE SHIELD | Admitting: Primary Care

## 2017-11-25 ENCOUNTER — Encounter: Payer: Self-pay | Admitting: Rehabilitation

## 2017-11-25 DIAGNOSIS — Z0289 Encounter for other administrative examinations: Secondary | ICD-10-CM
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
## 2017-11-26 ENCOUNTER — Encounter: Payer: Self-pay | Admitting: Occupational Therapy

## 2017-11-26 ENCOUNTER — Ambulatory Visit: Payer: BLUE CROSS/BLUE SHIELD | Admitting: Physical Therapy

## 2017-11-26 ENCOUNTER — Encounter: Payer: Self-pay | Admitting: Physical Therapy

## 2017-11-26 ENCOUNTER — Ambulatory Visit: Payer: BLUE CROSS/BLUE SHIELD | Admitting: Occupational Therapy

## 2017-11-26 DIAGNOSIS — G8254 Quadriplegia, C5-C7 incomplete: Secondary | ICD-10-CM

## 2017-11-26 DIAGNOSIS — M6281 Muscle weakness (generalized): Secondary | ICD-10-CM

## 2017-11-26 DIAGNOSIS — R208 Other disturbances of skin sensation: Secondary | ICD-10-CM

## 2017-11-26 DIAGNOSIS — R293 Abnormal posture: Secondary | ICD-10-CM

## 2017-11-26 DIAGNOSIS — M25612 Stiffness of left shoulder, not elsewhere classified: Secondary | ICD-10-CM
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
## 2017-12-01 ENCOUNTER — Ambulatory Visit: Payer: BLUE CROSS/BLUE SHIELD | Admitting: Rehabilitation

## 2017-12-01 ENCOUNTER — Ambulatory Visit: Payer: BLUE CROSS/BLUE SHIELD | Admitting: Physical Therapy

## 2017-12-03 ENCOUNTER — Encounter: Payer: Self-pay | Admitting: Physical Therapy

## 2017-12-03 ENCOUNTER — Ambulatory Visit: Payer: BLUE CROSS/BLUE SHIELD | Admitting: Occupational Therapy

## 2017-12-03 ENCOUNTER — Encounter: Payer: Self-pay | Admitting: Occupational Therapy

## 2017-12-03 ENCOUNTER — Ambulatory Visit: Payer: BLUE CROSS/BLUE SHIELD | Admitting: Physical Therapy

## 2017-12-03 DIAGNOSIS — M6281 Muscle weakness (generalized): Secondary | ICD-10-CM

## 2017-12-03 DIAGNOSIS — G8254 Quadriplegia, C5-C7 incomplete: Secondary | ICD-10-CM

## 2017-12-03 DIAGNOSIS — R208 Other disturbances of skin sensation: Secondary | ICD-10-CM

## 2017-12-03 DIAGNOSIS — M25612 Stiffness of left shoulder, not elsewhere classified: Secondary | ICD-10-CM

## 2017-12-03 DIAGNOSIS — R293 Abnormal posture: Secondary | ICD-10-CM
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
## 2017-12-04 NOTE — Addendum Note (Signed)
Addended by: Harriet ButtePARCELL, Gatlyn Lipari A on: 12/04/2017 01:54 PM   Modules accepted: Orders
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
## 2017-12-08 ENCOUNTER — Telehealth: Payer: Self-pay | Admitting: Physical Therapy

## 2017-12-08 ENCOUNTER — Encounter: Payer: Self-pay | Admitting: Physical Therapy

## 2017-12-08 ENCOUNTER — Ambulatory Visit: Payer: BLUE CROSS/BLUE SHIELD | Admitting: Physical Therapy

## 2017-12-08 DIAGNOSIS — M6281 Muscle weakness (generalized): Secondary | ICD-10-CM

## 2017-12-08 DIAGNOSIS — R208 Other disturbances of skin sensation: Secondary | ICD-10-CM

## 2017-12-08 DIAGNOSIS — R293 Abnormal posture: Secondary | ICD-10-CM

## 2017-12-08 DIAGNOSIS — G8254 Quadriplegia, C5-C7 incomplete: Secondary | ICD-10-CM

## 2017-12-08 NOTE — Telephone Encounter (Signed)
Entered in error
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
## 2017-12-09 ENCOUNTER — Encounter: Payer: Self-pay | Admitting: Physical Therapy

## 2017-12-09 ENCOUNTER — Ambulatory Visit: Payer: BLUE CROSS/BLUE SHIELD | Admitting: Occupational Therapy

## 2017-12-09 ENCOUNTER — Ambulatory Visit: Payer: BLUE CROSS/BLUE SHIELD | Admitting: Physical Therapy

## 2017-12-09 ENCOUNTER — Encounter: Payer: Self-pay | Admitting: Occupational Therapy

## 2017-12-09 DIAGNOSIS — R293 Abnormal posture: Secondary | ICD-10-CM

## 2017-12-09 DIAGNOSIS — G8254 Quadriplegia, C5-C7 incomplete: Secondary | ICD-10-CM

## 2017-12-09 DIAGNOSIS — R208 Other disturbances of skin sensation: Secondary | ICD-10-CM

## 2017-12-09 DIAGNOSIS — M25612 Stiffness of left shoulder, not elsewhere classified: Secondary | ICD-10-CM

## 2017-12-09 DIAGNOSIS — M6281 Muscle weakness (generalized): Secondary | ICD-10-CM
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
## 2017-12-12 ENCOUNTER — Encounter: Payer: BLUE CROSS/BLUE SHIELD | Admitting: Occupational Therapy

## 2017-12-15 ENCOUNTER — Ambulatory Visit: Payer: BLUE CROSS/BLUE SHIELD | Admitting: Occupational Therapy

## 2017-12-15 ENCOUNTER — Ambulatory Visit: Payer: BLUE CROSS/BLUE SHIELD | Attending: Physical Medicine & Rehabilitation | Admitting: Physical Therapy

## 2017-12-15 ENCOUNTER — Encounter: Payer: Self-pay | Admitting: Occupational Therapy

## 2017-12-15 DIAGNOSIS — R208 Other disturbances of skin sensation: Secondary | ICD-10-CM

## 2017-12-15 DIAGNOSIS — G8254 Quadriplegia, C5-C7 incomplete: Secondary | ICD-10-CM

## 2017-12-15 DIAGNOSIS — M25612 Stiffness of left shoulder, not elsewhere classified: Secondary | ICD-10-CM | POA: Diagnosis present

## 2017-12-15 DIAGNOSIS — R293 Abnormal posture: Secondary | ICD-10-CM | POA: Diagnosis present

## 2017-12-15 DIAGNOSIS — M6281 Muscle weakness (generalized): Secondary | ICD-10-CM | POA: Insufficient documentation
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
## 2017-12-17 ENCOUNTER — Ambulatory Visit: Payer: BLUE CROSS/BLUE SHIELD | Admitting: Occupational Therapy

## 2017-12-17 ENCOUNTER — Encounter: Payer: Self-pay | Admitting: Physical Therapy

## 2017-12-17 ENCOUNTER — Encounter: Payer: Self-pay | Admitting: Occupational Therapy

## 2017-12-17 ENCOUNTER — Ambulatory Visit: Payer: BLUE CROSS/BLUE SHIELD | Admitting: Physical Therapy

## 2017-12-17 DIAGNOSIS — M6281 Muscle weakness (generalized): Secondary | ICD-10-CM

## 2017-12-17 DIAGNOSIS — R208 Other disturbances of skin sensation: Secondary | ICD-10-CM

## 2017-12-17 DIAGNOSIS — R293 Abnormal posture: Secondary | ICD-10-CM

## 2017-12-17 DIAGNOSIS — G8254 Quadriplegia, C5-C7 incomplete: Secondary | ICD-10-CM

## 2017-12-17 DIAGNOSIS — M25612 Stiffness of left shoulder, not elsewhere classified: Secondary | ICD-10-CM
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
## 2017-12-22 ENCOUNTER — Ambulatory Visit: Payer: BLUE CROSS/BLUE SHIELD | Admitting: Occupational Therapy

## 2017-12-22 ENCOUNTER — Encounter: Payer: Self-pay | Admitting: Occupational Therapy

## 2017-12-22 ENCOUNTER — Ambulatory Visit: Payer: BLUE CROSS/BLUE SHIELD | Admitting: Physical Therapy

## 2017-12-22 DIAGNOSIS — M6281 Muscle weakness (generalized): Secondary | ICD-10-CM

## 2017-12-22 DIAGNOSIS — G8254 Quadriplegia, C5-C7 incomplete: Secondary | ICD-10-CM

## 2017-12-22 DIAGNOSIS — R208 Other disturbances of skin sensation: Secondary | ICD-10-CM

## 2017-12-22 DIAGNOSIS — M25612 Stiffness of left shoulder, not elsewhere classified: Secondary | ICD-10-CM

## 2017-12-22 DIAGNOSIS — R293 Abnormal posture: Secondary | ICD-10-CM
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
## 2017-12-25 ENCOUNTER — Ambulatory Visit: Payer: BLUE CROSS/BLUE SHIELD | Admitting: Rehabilitation

## 2017-12-25 ENCOUNTER — Encounter: Payer: BLUE CROSS/BLUE SHIELD | Admitting: Occupational Therapy

## 2017-12-25 ENCOUNTER — Encounter: Payer: Self-pay | Admitting: Rehabilitation

## 2017-12-25 DIAGNOSIS — G8254 Quadriplegia, C5-C7 incomplete: Secondary | ICD-10-CM | POA: Diagnosis not present

## 2017-12-25 DIAGNOSIS — R293 Abnormal posture: Secondary | ICD-10-CM

## 2017-12-25 DIAGNOSIS — M6281 Muscle weakness (generalized): Secondary | ICD-10-CM
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
## 2017-12-30 ENCOUNTER — Ambulatory Visit: Payer: BLUE CROSS/BLUE SHIELD | Admitting: Physical Therapy

## 2017-12-30 ENCOUNTER — Encounter: Payer: Self-pay | Admitting: Physical Therapy

## 2017-12-30 ENCOUNTER — Ambulatory Visit: Payer: BLUE CROSS/BLUE SHIELD | Admitting: Occupational Therapy

## 2017-12-30 ENCOUNTER — Encounter: Payer: Self-pay | Admitting: Occupational Therapy

## 2017-12-30 DIAGNOSIS — G8254 Quadriplegia, C5-C7 incomplete: Secondary | ICD-10-CM

## 2017-12-30 DIAGNOSIS — M6281 Muscle weakness (generalized): Secondary | ICD-10-CM

## 2017-12-30 DIAGNOSIS — R208 Other disturbances of skin sensation: Secondary | ICD-10-CM

## 2017-12-30 DIAGNOSIS — R293 Abnormal posture: Secondary | ICD-10-CM

## 2017-12-30 DIAGNOSIS — M25612 Stiffness of left shoulder, not elsewhere classified: Secondary | ICD-10-CM
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
## 2018-01-01 ENCOUNTER — Ambulatory Visit: Payer: BLUE CROSS/BLUE SHIELD | Admitting: Physical Therapy

## 2018-01-01 ENCOUNTER — Encounter: Payer: Self-pay | Admitting: Occupational Therapy

## 2018-01-01 ENCOUNTER — Encounter: Payer: Self-pay | Admitting: Physical Therapy

## 2018-01-01 ENCOUNTER — Ambulatory Visit: Payer: BLUE CROSS/BLUE SHIELD | Admitting: Occupational Therapy

## 2018-01-01 VITALS — BP 92/62 | HR 87

## 2018-01-01 DIAGNOSIS — M6281 Muscle weakness (generalized): Secondary | ICD-10-CM

## 2018-01-01 DIAGNOSIS — M25612 Stiffness of left shoulder, not elsewhere classified: Secondary | ICD-10-CM

## 2018-01-01 DIAGNOSIS — R208 Other disturbances of skin sensation: Secondary | ICD-10-CM

## 2018-01-01 DIAGNOSIS — G8254 Quadriplegia, C5-C7 incomplete: Secondary | ICD-10-CM | POA: Diagnosis not present

## 2018-01-01 DIAGNOSIS — R293 Abnormal posture: Secondary | ICD-10-CM
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
## 2018-01-08 ENCOUNTER — Ambulatory Visit: Payer: BLUE CROSS/BLUE SHIELD | Admitting: Physical Therapy

## 2018-01-09 ENCOUNTER — Ambulatory Visit: Payer: BLUE CROSS/BLUE SHIELD | Admitting: Physical Therapy

## 2018-01-13 ENCOUNTER — Encounter: Payer: Self-pay | Admitting: Rehabilitation

## 2018-01-13 ENCOUNTER — Ambulatory Visit: Payer: BLUE CROSS/BLUE SHIELD | Attending: Physical Medicine & Rehabilitation | Admitting: Rehabilitation

## 2018-01-13 DIAGNOSIS — G8254 Quadriplegia, C5-C7 incomplete: Secondary | ICD-10-CM

## 2018-01-13 DIAGNOSIS — M6281 Muscle weakness (generalized): Secondary | ICD-10-CM

## 2018-01-13 DIAGNOSIS — R208 Other disturbances of skin sensation: Secondary | ICD-10-CM | POA: Diagnosis present

## 2018-01-13 DIAGNOSIS — R293 Abnormal posture: Secondary | ICD-10-CM

## 2018-01-13 DIAGNOSIS — M25612 Stiffness of left shoulder, not elsewhere classified: Secondary | ICD-10-CM | POA: Insufficient documentation
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
## 2018-01-19 ENCOUNTER — Encounter: Payer: Self-pay | Admitting: Physical Therapy

## 2018-01-19 ENCOUNTER — Ambulatory Visit: Payer: BLUE CROSS/BLUE SHIELD | Admitting: Physical Therapy

## 2018-01-19 DIAGNOSIS — R293 Abnormal posture: Secondary | ICD-10-CM

## 2018-01-19 DIAGNOSIS — G8254 Quadriplegia, C5-C7 incomplete: Secondary | ICD-10-CM

## 2018-01-19 DIAGNOSIS — M6281 Muscle weakness (generalized): Secondary | ICD-10-CM | POA: Diagnosis not present
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
## 2018-01-21 ENCOUNTER — Ambulatory Visit: Payer: BLUE CROSS/BLUE SHIELD | Admitting: Occupational Therapy

## 2018-01-21 ENCOUNTER — Ambulatory Visit: Payer: BLUE CROSS/BLUE SHIELD | Admitting: Physical Therapy

## 2018-01-26 ENCOUNTER — Ambulatory Visit: Payer: BLUE CROSS/BLUE SHIELD | Admitting: Occupational Therapy

## 2018-01-26 ENCOUNTER — Ambulatory Visit: Payer: BLUE CROSS/BLUE SHIELD | Admitting: Rehabilitation

## 2018-01-26 ENCOUNTER — Encounter: Payer: Self-pay | Admitting: Rehabilitation

## 2018-01-26 DIAGNOSIS — M6281 Muscle weakness (generalized): Secondary | ICD-10-CM

## 2018-01-26 DIAGNOSIS — R293 Abnormal posture: Secondary | ICD-10-CM
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
## 2018-01-28 ENCOUNTER — Ambulatory Visit: Payer: BLUE CROSS/BLUE SHIELD | Admitting: Physical Therapy

## 2018-01-28 ENCOUNTER — Encounter: Payer: Self-pay | Admitting: Physical Therapy

## 2018-01-28 DIAGNOSIS — M6281 Muscle weakness (generalized): Secondary | ICD-10-CM

## 2018-01-28 DIAGNOSIS — R208 Other disturbances of skin sensation: Secondary | ICD-10-CM

## 2018-01-28 DIAGNOSIS — G8254 Quadriplegia, C5-C7 incomplete: Secondary | ICD-10-CM

## 2018-01-28 DIAGNOSIS — R293 Abnormal posture: Secondary | ICD-10-CM
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
## 2018-01-29 ENCOUNTER — Ambulatory Visit: Payer: BLUE CROSS/BLUE SHIELD | Admitting: Occupational Therapy

## 2018-02-03 ENCOUNTER — Ambulatory Visit: Payer: BLUE CROSS/BLUE SHIELD | Admitting: Rehabilitation

## 2018-02-03 ENCOUNTER — Encounter: Payer: Self-pay | Admitting: Occupational Therapy

## 2018-02-03 ENCOUNTER — Encounter: Payer: Self-pay | Admitting: Rehabilitation

## 2018-02-03 ENCOUNTER — Ambulatory Visit: Payer: BLUE CROSS/BLUE SHIELD | Admitting: Occupational Therapy

## 2018-02-03 VITALS — BP 95/64

## 2018-02-03 DIAGNOSIS — M25612 Stiffness of left shoulder, not elsewhere classified: Secondary | ICD-10-CM

## 2018-02-03 DIAGNOSIS — M6281 Muscle weakness (generalized): Secondary | ICD-10-CM

## 2018-02-03 DIAGNOSIS — R293 Abnormal posture: Secondary | ICD-10-CM

## 2018-02-03 DIAGNOSIS — R208 Other disturbances of skin sensation: Secondary | ICD-10-CM

## 2018-02-03 DIAGNOSIS — G8254 Quadriplegia, C5-C7 incomplete: Secondary | ICD-10-CM

## 2018-02-03 NOTE — Addendum Note (Signed)
Addended by: Harriet ButtePARCELL, Jinny Sweetland A on: 02/03/2018 08:02 PM   Modules accepted: Orders
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
## 2018-02-06 ENCOUNTER — Ambulatory Visit: Payer: BLUE CROSS/BLUE SHIELD | Admitting: Occupational Therapy

## 2018-02-06 ENCOUNTER — Encounter: Payer: Self-pay | Admitting: Occupational Therapy

## 2018-02-06 ENCOUNTER — Encounter: Payer: Self-pay | Admitting: Physical Therapy

## 2018-02-06 ENCOUNTER — Ambulatory Visit: Payer: BLUE CROSS/BLUE SHIELD | Admitting: Physical Therapy

## 2018-02-06 VITALS — BP 84/43

## 2018-02-06 VITALS — BP 70/40 | HR 74

## 2018-02-06 DIAGNOSIS — M6281 Muscle weakness (generalized): Secondary | ICD-10-CM

## 2018-02-06 DIAGNOSIS — R208 Other disturbances of skin sensation: Secondary | ICD-10-CM

## 2018-02-06 DIAGNOSIS — G8254 Quadriplegia, C5-C7 incomplete: Secondary | ICD-10-CM

## 2018-02-06 DIAGNOSIS — M25612 Stiffness of left shoulder, not elsewhere classified: Secondary | ICD-10-CM

## 2018-02-06 DIAGNOSIS — R293 Abnormal posture: Secondary | ICD-10-CM
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
## 2018-02-09 ENCOUNTER — Ambulatory Visit: Payer: BLUE CROSS/BLUE SHIELD | Admitting: Occupational Therapy

## 2018-02-09 ENCOUNTER — Encounter: Payer: Self-pay | Admitting: Occupational Therapy

## 2018-02-09 DIAGNOSIS — R293 Abnormal posture: Secondary | ICD-10-CM

## 2018-02-09 DIAGNOSIS — R208 Other disturbances of skin sensation: Secondary | ICD-10-CM

## 2018-02-09 DIAGNOSIS — G8254 Quadriplegia, C5-C7 incomplete: Secondary | ICD-10-CM

## 2018-02-09 DIAGNOSIS — M6281 Muscle weakness (generalized): Secondary | ICD-10-CM

## 2018-02-09 DIAGNOSIS — M25612 Stiffness of left shoulder, not elsewhere classified: Secondary | ICD-10-CM
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
## 2018-02-10 ENCOUNTER — Encounter: Payer: Self-pay | Admitting: Occupational Therapy

## 2018-02-10 ENCOUNTER — Ambulatory Visit: Payer: BLUE CROSS/BLUE SHIELD

## 2018-02-10 ENCOUNTER — Ambulatory Visit: Payer: BLUE CROSS/BLUE SHIELD | Attending: Physical Medicine & Rehabilitation | Admitting: Occupational Therapy

## 2018-02-10 DIAGNOSIS — R208 Other disturbances of skin sensation: Secondary | ICD-10-CM

## 2018-02-10 DIAGNOSIS — M25612 Stiffness of left shoulder, not elsewhere classified: Secondary | ICD-10-CM | POA: Diagnosis present

## 2018-02-10 DIAGNOSIS — M6281 Muscle weakness (generalized): Secondary | ICD-10-CM

## 2018-02-10 DIAGNOSIS — R293 Abnormal posture: Secondary | ICD-10-CM | POA: Diagnosis present

## 2018-02-10 DIAGNOSIS — G8254 Quadriplegia, C5-C7 incomplete: Secondary | ICD-10-CM
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
## 2018-02-12 ENCOUNTER — Telehealth: Payer: Self-pay | Admitting: Occupational Therapy

## 2018-02-12 NOTE — Telephone Encounter (Signed)
Not sure how to write an order for you to print but could write a letter.Marland KitchenMarland Kitchen

## 2018-02-12 NOTE — Telephone Encounter (Signed)
Dr. Riley Kill, This patient would like to participate in [employer] in [place], [place].  He needs a physician order clearing him to participate in that program.  Can you please write that if you agree and I can print and give to him next time he is here for therapy. Thanks! Mackie Pai

## 2018-02-13 ENCOUNTER — Encounter: Payer: Self-pay | Admitting: Rehabilitation

## 2018-02-13 ENCOUNTER — Ambulatory Visit: Payer: BLUE CROSS/BLUE SHIELD | Admitting: Rehabilitation

## 2018-02-13 DIAGNOSIS — M6281 Muscle weakness (generalized): Secondary | ICD-10-CM | POA: Diagnosis not present

## 2018-02-13 DIAGNOSIS — R293 Abnormal posture: Secondary | ICD-10-CM
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
## 2018-02-17 ENCOUNTER — Ambulatory Visit: Payer: BLUE CROSS/BLUE SHIELD | Admitting: Rehabilitation

## 2018-02-17 ENCOUNTER — Ambulatory Visit: Payer: BLUE CROSS/BLUE SHIELD | Admitting: Occupational Therapy

## 2018-02-18 ENCOUNTER — Ambulatory Visit: Payer: BLUE CROSS/BLUE SHIELD | Admitting: Occupational Therapy

## 2018-02-18 ENCOUNTER — Encounter: Payer: Self-pay | Admitting: Occupational Therapy

## 2018-02-18 ENCOUNTER — Ambulatory Visit: Payer: BLUE CROSS/BLUE SHIELD | Admitting: Physical Therapy

## 2018-02-18 DIAGNOSIS — M25612 Stiffness of left shoulder, not elsewhere classified: Secondary | ICD-10-CM

## 2018-02-18 DIAGNOSIS — M6281 Muscle weakness (generalized): Secondary | ICD-10-CM | POA: Diagnosis not present

## 2018-02-18 DIAGNOSIS — R293 Abnormal posture: Secondary | ICD-10-CM

## 2018-02-18 DIAGNOSIS — R208 Other disturbances of skin sensation: Secondary | ICD-10-CM

## 2018-02-18 DIAGNOSIS — G8254 Quadriplegia, C5-C7 incomplete: Secondary | ICD-10-CM
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
## 2018-02-25 ENCOUNTER — Ambulatory Visit: Payer: BLUE CROSS/BLUE SHIELD | Admitting: Occupational Therapy

## 2018-02-25 ENCOUNTER — Ambulatory Visit: Payer: BLUE CROSS/BLUE SHIELD | Admitting: Physical Therapy

## 2018-03-02 ENCOUNTER — Ambulatory Visit: Payer: BLUE CROSS/BLUE SHIELD | Admitting: Physical Therapy

## 2018-03-02 ENCOUNTER — Encounter: Payer: Self-pay | Admitting: Occupational Therapy

## 2018-03-02 ENCOUNTER — Ambulatory Visit: Payer: BLUE CROSS/BLUE SHIELD | Admitting: Occupational Therapy

## 2018-03-02 ENCOUNTER — Encounter: Payer: Self-pay | Admitting: Physical Therapy

## 2018-03-02 DIAGNOSIS — R208 Other disturbances of skin sensation: Secondary | ICD-10-CM

## 2018-03-02 DIAGNOSIS — M6281 Muscle weakness (generalized): Secondary | ICD-10-CM

## 2018-03-02 DIAGNOSIS — R293 Abnormal posture: Secondary | ICD-10-CM

## 2018-03-02 DIAGNOSIS — G8254 Quadriplegia, C5-C7 incomplete: Secondary | ICD-10-CM

## 2018-03-02 DIAGNOSIS — M25612 Stiffness of left shoulder, not elsewhere classified: Secondary | ICD-10-CM
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
## 2018-03-05 ENCOUNTER — Encounter: Payer: Self-pay | Admitting: Occupational Therapy

## 2018-03-05 ENCOUNTER — Ambulatory Visit: Payer: BLUE CROSS/BLUE SHIELD | Admitting: Occupational Therapy

## 2018-03-05 ENCOUNTER — Encounter: Payer: Self-pay | Admitting: Physical Therapy

## 2018-03-05 ENCOUNTER — Ambulatory Visit: Payer: BLUE CROSS/BLUE SHIELD | Admitting: Physical Therapy

## 2018-03-05 DIAGNOSIS — M6281 Muscle weakness (generalized): Secondary | ICD-10-CM | POA: Diagnosis not present

## 2018-03-05 DIAGNOSIS — M25612 Stiffness of left shoulder, not elsewhere classified: Secondary | ICD-10-CM

## 2018-03-05 DIAGNOSIS — G8254 Quadriplegia, C5-C7 incomplete: Secondary | ICD-10-CM

## 2018-03-05 DIAGNOSIS — R293 Abnormal posture: Secondary | ICD-10-CM

## 2018-03-05 DIAGNOSIS — R208 Other disturbances of skin sensation: Secondary | ICD-10-CM
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
## 2018-03-12 ENCOUNTER — Ambulatory Visit: Payer: BLUE CROSS/BLUE SHIELD | Admitting: Physical Therapy

## 2018-03-12 ENCOUNTER — Encounter: Payer: Self-pay | Admitting: Occupational Therapy

## 2018-03-12 ENCOUNTER — Ambulatory Visit: Payer: BLUE CROSS/BLUE SHIELD | Admitting: Occupational Therapy

## 2018-03-12 ENCOUNTER — Encounter: Payer: Self-pay | Admitting: Physical Therapy

## 2018-03-12 DIAGNOSIS — R293 Abnormal posture: Secondary | ICD-10-CM

## 2018-03-12 DIAGNOSIS — M6281 Muscle weakness (generalized): Secondary | ICD-10-CM

## 2018-03-12 DIAGNOSIS — R208 Other disturbances of skin sensation: Secondary | ICD-10-CM

## 2018-03-12 DIAGNOSIS — M25612 Stiffness of left shoulder, not elsewhere classified: Secondary | ICD-10-CM

## 2018-03-12 DIAGNOSIS — G8254 Quadriplegia, C5-C7 incomplete: Secondary | ICD-10-CM
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
## 2018-03-16 ENCOUNTER — Ambulatory Visit: Payer: BLUE CROSS/BLUE SHIELD | Admitting: Rehabilitation

## 2018-03-16 ENCOUNTER — Encounter: Payer: Self-pay | Admitting: Rehabilitation

## 2018-03-16 ENCOUNTER — Encounter: Payer: Self-pay | Admitting: Occupational Therapy

## 2018-03-16 ENCOUNTER — Ambulatory Visit: Payer: BLUE CROSS/BLUE SHIELD | Attending: Physical Medicine & Rehabilitation | Admitting: Occupational Therapy

## 2018-03-16 DIAGNOSIS — R293 Abnormal posture: Secondary | ICD-10-CM | POA: Insufficient documentation

## 2018-03-16 DIAGNOSIS — G8254 Quadriplegia, C5-C7 incomplete: Secondary | ICD-10-CM | POA: Insufficient documentation

## 2018-03-16 DIAGNOSIS — M6281 Muscle weakness (generalized): Secondary | ICD-10-CM

## 2018-03-16 DIAGNOSIS — M25612 Stiffness of left shoulder, not elsewhere classified: Secondary | ICD-10-CM

## 2018-03-16 DIAGNOSIS — R208 Other disturbances of skin sensation: Secondary | ICD-10-CM | POA: Diagnosis present
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
## 2018-03-18 ENCOUNTER — Encounter: Payer: Self-pay | Admitting: Occupational Therapy

## 2018-03-18 ENCOUNTER — Ambulatory Visit: Payer: BLUE CROSS/BLUE SHIELD | Admitting: Physical Therapy

## 2018-03-18 ENCOUNTER — Ambulatory Visit: Payer: BLUE CROSS/BLUE SHIELD | Admitting: Occupational Therapy

## 2018-03-18 DIAGNOSIS — R293 Abnormal posture: Secondary | ICD-10-CM | POA: Diagnosis not present

## 2018-03-18 DIAGNOSIS — G8254 Quadriplegia, C5-C7 incomplete: Secondary | ICD-10-CM

## 2018-03-18 DIAGNOSIS — M6281 Muscle weakness (generalized): Secondary | ICD-10-CM

## 2018-03-18 DIAGNOSIS — M25612 Stiffness of left shoulder, not elsewhere classified: Secondary | ICD-10-CM

## 2018-03-18 DIAGNOSIS — R208 Other disturbances of skin sensation: Secondary | ICD-10-CM
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
## 2018-03-19 ENCOUNTER — Encounter: Payer: Self-pay | Admitting: Physical Therapy
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
## 2018-03-23 ENCOUNTER — Ambulatory Visit: Payer: BLUE CROSS/BLUE SHIELD | Admitting: Occupational Therapy

## 2018-03-23 ENCOUNTER — Ambulatory Visit: Payer: BLUE CROSS/BLUE SHIELD | Admitting: Physical Therapy

## 2018-03-25 ENCOUNTER — Encounter: Payer: Self-pay | Admitting: Physical Therapy

## 2018-03-25 ENCOUNTER — Ambulatory Visit: Payer: BLUE CROSS/BLUE SHIELD | Admitting: Physical Therapy

## 2018-03-25 ENCOUNTER — Ambulatory Visit: Payer: BLUE CROSS/BLUE SHIELD | Admitting: Occupational Therapy

## 2018-03-25 DIAGNOSIS — M6281 Muscle weakness (generalized): Secondary | ICD-10-CM

## 2018-03-25 DIAGNOSIS — R208 Other disturbances of skin sensation: Secondary | ICD-10-CM

## 2018-03-25 DIAGNOSIS — R293 Abnormal posture: Secondary | ICD-10-CM

## 2018-03-25 DIAGNOSIS — G8254 Quadriplegia, C5-C7 incomplete: Secondary | ICD-10-CM

## 2018-03-25 DIAGNOSIS — M25612 Stiffness of left shoulder, not elsewhere classified: Secondary | ICD-10-CM
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
## 2018-03-27 ENCOUNTER — Telehealth: Payer: Self-pay | Admitting: *Deleted

## 2018-03-27 NOTE — Telephone Encounter (Signed)
This patient needs a follow up visit with our office. Hasn't been seen since March

## 2018-03-27 NOTE — Telephone Encounter (Signed)
Patients mother left a message asking if Dr. Riley KillSwartz would write a script so patient can use the FES bike at '[facility] spinal therapy center.

## 2018-03-30 ENCOUNTER — Ambulatory Visit: Payer: BLUE CROSS/BLUE SHIELD | Admitting: Occupational Therapy

## 2018-03-30 ENCOUNTER — Ambulatory Visit: Payer: BLUE CROSS/BLUE SHIELD | Admitting: Physical Therapy

## 2018-04-01 ENCOUNTER — Encounter: Payer: Self-pay | Admitting: Occupational Therapy

## 2018-04-01 ENCOUNTER — Encounter: Payer: Self-pay | Admitting: Physical Therapy

## 2018-04-01 ENCOUNTER — Ambulatory Visit: Payer: BLUE CROSS/BLUE SHIELD | Admitting: Physical Therapy

## 2018-04-01 ENCOUNTER — Ambulatory Visit: Payer: BLUE CROSS/BLUE SHIELD | Admitting: Occupational Therapy

## 2018-04-01 DIAGNOSIS — R293 Abnormal posture: Secondary | ICD-10-CM

## 2018-04-01 DIAGNOSIS — M6281 Muscle weakness (generalized): Secondary | ICD-10-CM

## 2018-04-01 DIAGNOSIS — R208 Other disturbances of skin sensation: Secondary | ICD-10-CM

## 2018-04-01 DIAGNOSIS — G8254 Quadriplegia, C5-C7 incomplete: Secondary | ICD-10-CM

## 2018-04-01 DIAGNOSIS — M25612 Stiffness of left shoulder, not elsewhere classified: Secondary | ICD-10-CM
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
## 2018-04-02 NOTE — Telephone Encounter (Signed)
Left a voicemail to mother at [phone] to call our office and schedule an appointment with Dr. Riley KillSwartz.

## 2018-04-06 ENCOUNTER — Ambulatory Visit: Payer: BLUE CROSS/BLUE SHIELD | Admitting: Physical Therapy

## 2018-04-06 ENCOUNTER — Encounter: Payer: Self-pay | Admitting: Physical Therapy

## 2018-04-06 DIAGNOSIS — M6281 Muscle weakness (generalized): Secondary | ICD-10-CM

## 2018-04-06 DIAGNOSIS — G8254 Quadriplegia, C5-C7 incomplete: Secondary | ICD-10-CM

## 2018-04-06 DIAGNOSIS — R293 Abnormal posture: Secondary | ICD-10-CM | POA: Diagnosis not present

## 2018-04-06 DIAGNOSIS — R208 Other disturbances of skin sensation: Secondary | ICD-10-CM
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
## 2018-04-20 ENCOUNTER — Ambulatory Visit: Payer: BLUE CROSS/BLUE SHIELD | Attending: Physical Medicine & Rehabilitation | Admitting: Rehabilitation

## 2018-04-20 DIAGNOSIS — G8254 Quadriplegia, C5-C7 incomplete: Secondary | ICD-10-CM | POA: Insufficient documentation

## 2018-04-20 DIAGNOSIS — R208 Other disturbances of skin sensation: Secondary | ICD-10-CM | POA: Insufficient documentation

## 2018-04-20 DIAGNOSIS — R293 Abnormal posture: Secondary | ICD-10-CM | POA: Insufficient documentation

## 2018-04-20 DIAGNOSIS — M6281 Muscle weakness (generalized): Secondary | ICD-10-CM | POA: Insufficient documentation

## 2018-04-27 ENCOUNTER — Ambulatory Visit: Payer: BLUE CROSS/BLUE SHIELD | Admitting: Physical Therapy

## 2018-04-27 ENCOUNTER — Encounter: Payer: Self-pay | Admitting: Physical Therapy

## 2018-04-27 DIAGNOSIS — G8254 Quadriplegia, C5-C7 incomplete: Secondary | ICD-10-CM | POA: Diagnosis not present

## 2018-04-27 DIAGNOSIS — R293 Abnormal posture: Secondary | ICD-10-CM | POA: Diagnosis present

## 2018-04-27 DIAGNOSIS — M6281 Muscle weakness (generalized): Secondary | ICD-10-CM | POA: Diagnosis present

## 2018-04-27 DIAGNOSIS — R208 Other disturbances of skin sensation: Secondary | ICD-10-CM
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
## 2018-05-04 ENCOUNTER — Ambulatory Visit: Payer: BLUE CROSS/BLUE SHIELD | Admitting: Rehabilitation

## 2018-05-11 ENCOUNTER — Ambulatory Visit: Payer: BLUE CROSS/BLUE SHIELD | Admitting: Rehabilitation

## 2018-05-12 ENCOUNTER — Telehealth: Payer: Self-pay | Admitting: *Deleted

## 2018-05-12 NOTE — Telephone Encounter (Signed)
Revonda StandardAllison from [facility] called requesting notes to assist with the order for Gonzalo's catheters.  I have called her back and directed her to call [facility] for this information as we have not seen him since March of this year and are not handling these orders.

## 2018-05-15 ENCOUNTER — Encounter: Payer: Self-pay | Admitting: Family Medicine

## 2018-05-15 ENCOUNTER — Ambulatory Visit (INDEPENDENT_AMBULATORY_CARE_PROVIDER_SITE_OTHER): Payer: BLUE CROSS/BLUE SHIELD | Admitting: Family Medicine

## 2018-05-15 VITALS — BP 108/66 | HR 89 | Temp 98.6°F

## 2018-05-15 DIAGNOSIS — N3001 Acute cystitis with hematuria: Secondary | ICD-10-CM | POA: Diagnosis not present

## 2018-05-15 DIAGNOSIS — R3915 Urgency of urination: Secondary | ICD-10-CM

## 2018-05-15 LAB — POC URINALSYSI DIPSTICK (AUTOMATED)
BILIRUBIN UA: NEGATIVE
Glucose, UA: NEGATIVE
Ketones, UA: NEGATIVE
NITRITE UA: NEGATIVE
PH UA: 6 (ref 5.0–8.0)
Protein, UA: NEGATIVE
Spec Grav, UA: >=1.03 — AB (ref 1.010–1.025)
UROBILINOGEN UA: 0.2 U/dL

## 2018-05-15 MED ORDER — CEPHALEXIN 500 MG PO CAPS: 500.0000 mg | ORAL_CAPSULE | Freq: Two times a day (BID) | ORAL | 0 refills | Status: AC
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
## 2018-05-15 NOTE — Patient Instructions (Signed)
Good to meet you today  I have sent in a prescription for an antibiotic and will notify you of urine culture results on Monday  If you develop nausea/vomiting, fever, pain- go to the emergency room  Increase fluids until urine is light yellow- mostly water and zero calorie beverages

## 2018-05-17 LAB — URINE CULTURE
MICRO NUMBER: 8810
SPECIMEN QUALITY:: ADEQUATE

## 2018-05-18 ENCOUNTER — Encounter: Payer: Self-pay | Admitting: Physical Therapy

## 2018-05-18 ENCOUNTER — Ambulatory Visit: Payer: BLUE CROSS/BLUE SHIELD | Attending: Physical Medicine & Rehabilitation | Admitting: Physical Therapy

## 2018-05-18 DIAGNOSIS — R208 Other disturbances of skin sensation: Secondary | ICD-10-CM | POA: Diagnosis present

## 2018-05-18 DIAGNOSIS — G8254 Quadriplegia, C5-C7 incomplete: Secondary | ICD-10-CM | POA: Diagnosis not present

## 2018-05-18 DIAGNOSIS — M6281 Muscle weakness (generalized): Secondary | ICD-10-CM

## 2018-05-18 DIAGNOSIS — R293 Abnormal posture: Secondary | ICD-10-CM | POA: Diagnosis present
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
## 2018-05-19 ENCOUNTER — Telehealth: Payer: Self-pay | Admitting: *Deleted

## 2018-05-19 NOTE — Telephone Encounter (Signed)
Left message on voicemail for patient to call back. Need to give patient his lab results.

## 2018-05-20 NOTE — Telephone Encounter (Signed)
Called patient and lab results were given and he verbalized understanding.

## 2018-05-22 ENCOUNTER — Telehealth: Payer: Self-pay | Admitting: Primary Care

## 2018-05-22 NOTE — Telephone Encounter (Signed)
Pt's dad came by office and said Keith [name] came into office for UTI last week and need for you to fax recent office notes to Dr. Ophelia Charter office. Fax #  [phone] Please call Keith [name] (pt's dad) if you have any questions. [phone].

## 2018-05-22 NOTE — Telephone Encounter (Signed)
Left detail message for patient's father stating that we would need a medical release to be able to release information not within [facility].

## 2018-05-25 ENCOUNTER — Ambulatory Visit: Payer: BLUE CROSS/BLUE SHIELD | Admitting: Physical Therapy

## 2018-06-01 ENCOUNTER — Ambulatory Visit: Payer: BLUE CROSS/BLUE SHIELD | Admitting: Physical Therapy

## 2018-06-08 ENCOUNTER — Ambulatory Visit: Payer: BLUE CROSS/BLUE SHIELD | Admitting: Physical Therapy

## 2018-07-31 ENCOUNTER — Encounter: Payer: Self-pay | Admitting: Physical Therapy

## 2018-07-31 NOTE — Therapy (Signed)
[facility] [address] [place], [place], [address] Phone: [phone]   Fax:  [phone]  Patient Details  Name: Keith [name] MRN: [id number] Date of Birth: 09-21-1995 Referring Provider (PT): Dr Naaman Plummer  Encounter Date: 07/31/2018   PHYSICAL THERAPY DISCHARGE SUMMARY  Visits from Start of Care: 40  Current functional level related to goals / functional outcomes: Unable to assess; pt did not return for final PT visits.  Pt was to continue community wellness at [employer].   Remaining deficits: Impaired balance/postural control, impaired transfers, impaired strength   Education / Equipment: HEP  Plan: Patient agrees to discharge.  Patient goals were partially met. Patient is being discharged due to not returning since the last visit.  ?????     Rico Junker, PT, DPT 07/31/18    3:13 PM  [facility] [address] [place], [place], [address] Phone: [phone]   Fax:  [phone]

## 2018-11-10 ENCOUNTER — Telehealth: Payer: Self-pay

## 2018-11-10 NOTE — Telephone Encounter (Signed)
Ardith Dark from [employer] out of [place] called stating he is going to fax over information and prescription to be signed by provider on a device for patient to use in home. He has spoke to patient and his family about this device. He states patient mom has already put a down payment on this device.

## 2018-11-10 NOTE — Telephone Encounter (Signed)
No sir. He hasn't been seen by me since March of 2019. This will need the signature of a treating provider

## 2018-11-10 NOTE — Telephone Encounter (Signed)
Keith [name] has been notified.

## 2018-11-26 ENCOUNTER — Telehealth: Payer: Self-pay | Admitting: Primary Care

## 2018-11-26 NOTE — Telephone Encounter (Signed)
Called to r/s 7/20 appt. Lvm asking pt to call office.

## 2018-11-26 NOTE — Telephone Encounter (Signed)
Please notify patient's mother that I received a fax from [employer] regarding a "smart glove" device. I need some additional information so please set up a virtual visit so we can discuss.
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
## 2018-11-30 ENCOUNTER — Encounter: Payer: BLUE CROSS/BLUE SHIELD | Admitting: Primary Care

## 2018-11-30 IMAGING — CR DG CHEST 1V PORT
1 series · 1 of 1 positions shown · non-contrast
Comparison: 05/28/2017

CLINICAL DATA: POST OP POST CERV SURG. BRAY PLACEMENT

EXAM:
PORTABLE CHEST 1 VIEW

[AP]
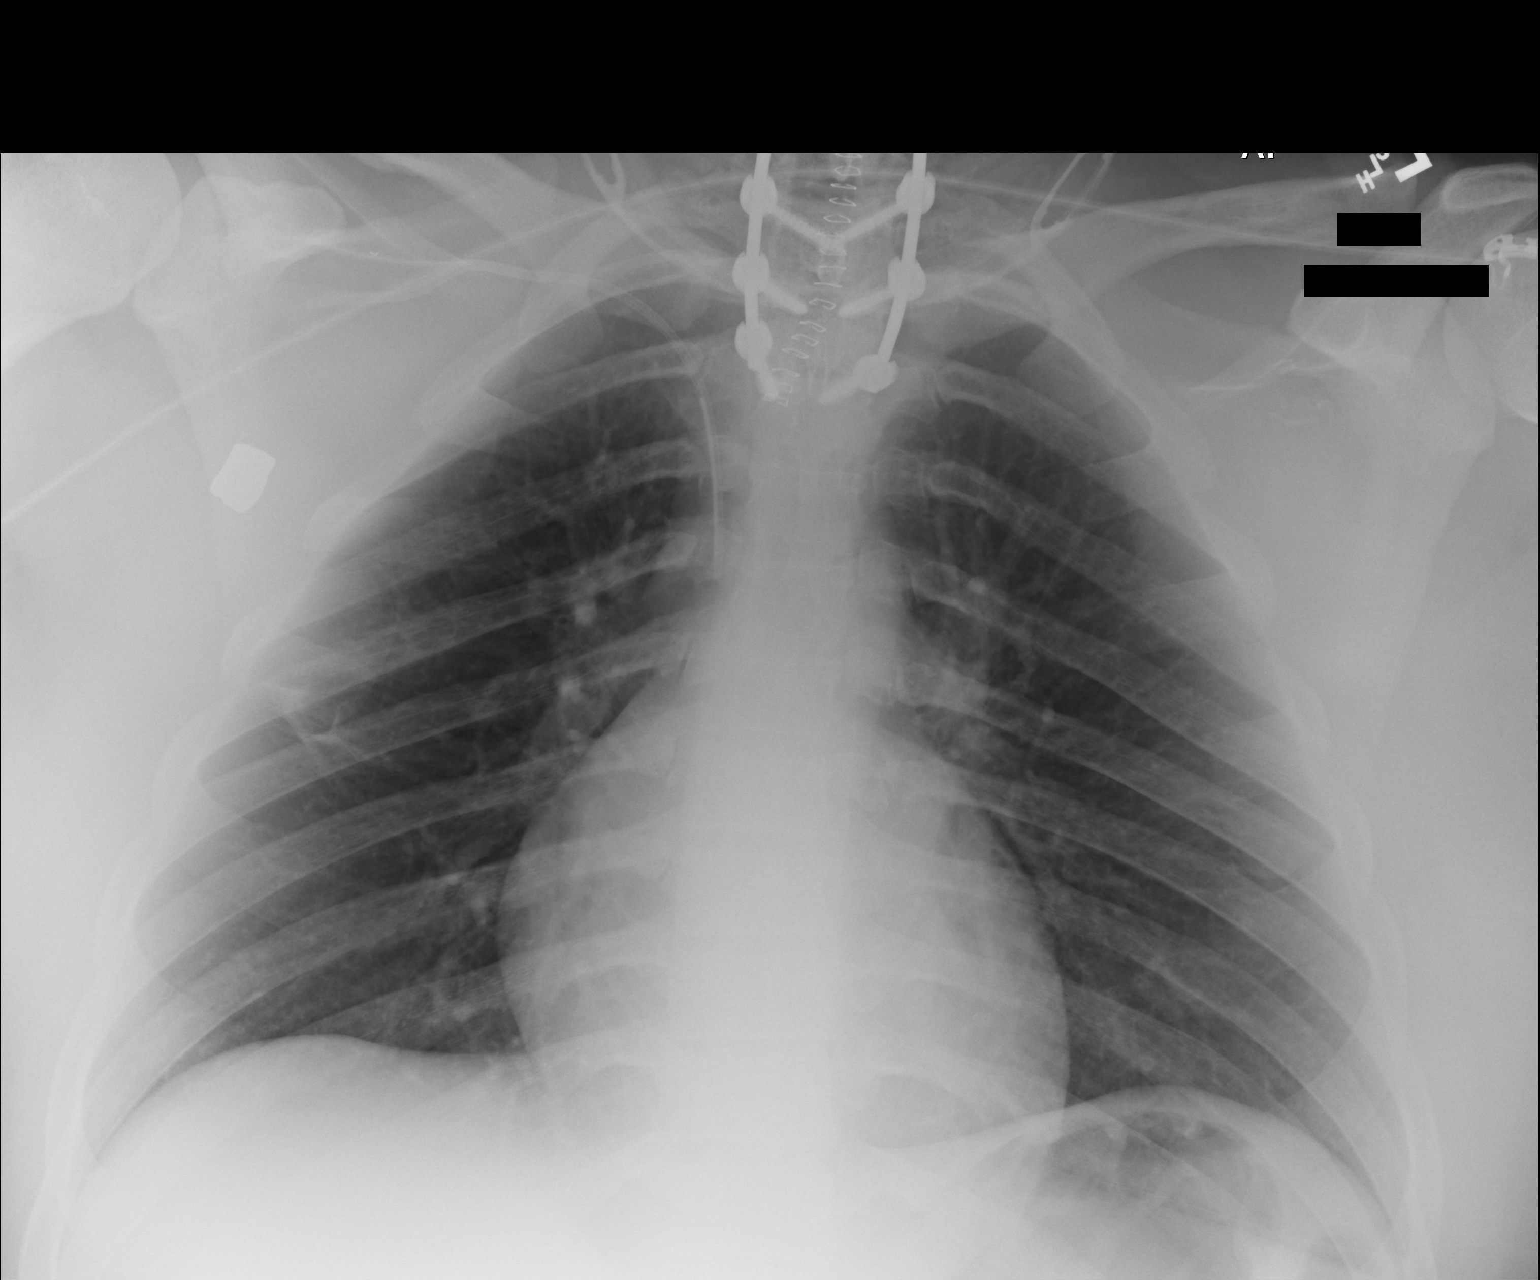

[1 of 1 positions shown; findings below may reference images not displayed]

FINDINGS: A right subclavian central venous line has been placed, tip
overlying the level of superior vena cava. The heart size is normal.
No pneumothorax. Artifact or atelectasis overlies the right mid lung
zone. Postoperative changes in the cervical spine.
IMPRESSION: Interval placement of right subclavian central line, without
evidence for pneumothorax.
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
## 2018-12-04 ENCOUNTER — Ambulatory Visit (INDEPENDENT_AMBULATORY_CARE_PROVIDER_SITE_OTHER): Payer: BC Managed Care – PPO | Admitting: Primary Care

## 2018-12-04 ENCOUNTER — Encounter: Payer: Self-pay | Admitting: Primary Care

## 2018-12-04 DIAGNOSIS — M62838 Other muscle spasm: Secondary | ICD-10-CM | POA: Diagnosis not present

## 2018-12-04 DIAGNOSIS — K219 Gastro-esophageal reflux disease without esophagitis: Secondary | ICD-10-CM | POA: Diagnosis not present

## 2018-12-04 DIAGNOSIS — G8250 Quadriplegia, unspecified: Secondary | ICD-10-CM | POA: Diagnosis not present

## 2018-12-04 DIAGNOSIS — Z0000 Encounter for general adult medical examination without abnormal findings: Secondary | ICD-10-CM

## 2018-12-04 DIAGNOSIS — N319 Neuromuscular dysfunction of bladder, unspecified: Secondary | ICD-10-CM

## 2018-12-04 DIAGNOSIS — K592 Neurogenic bowel, not elsewhere classified: Secondary | ICD-10-CM

## 2018-12-04 DIAGNOSIS — M792 Neuralgia and neuritis, unspecified: Secondary | ICD-10-CM

## 2018-12-04 MED ORDER — PANTOPRAZOLE SODIUM 40 MG PO TBEC: 40.0000 mg | DELAYED_RELEASE_TABLET | Freq: Every day | ORAL | 1 refills | Status: DC

## 2018-12-04 MED ORDER — GABAPENTIN 300 MG PO CAPS: 300.0000 mg | ORAL_CAPSULE | Freq: Three times a day (TID) | ORAL | 0 refills | Status: DC

## 2018-12-04 MED ORDER — BACLOFEN 5 MG PO TABS: 5.0000 mg | ORAL_TABLET | ORAL | 0 refills | Status: DC | PRN
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
## 2018-12-04 NOTE — Assessment & Plan Note (Signed)
Chronic which is typically aggravated with physical therapy. Refill provided for baclofen to use before and after therapy as needed.

## 2018-12-04 NOTE — Telephone Encounter (Signed)
Patient already have appt on 12/04/2018 at 2pm

## 2018-12-04 NOTE — Assessment & Plan Note (Signed)
Continues to self catheterize every 3 hours at home. Denies recent UTI.

## 2018-12-04 NOTE — Assessment & Plan Note (Deleted)
Continues to self catheterize every 3 hours at home. Denies recent UTI. 
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
## 2018-12-04 NOTE — Assessment & Plan Note (Signed)
Did very well on pantoprazole, refill provided today.

## 2018-12-04 NOTE — Assessment & Plan Note (Signed)
Seems to have progressed since her last visit, especially with gross motor movement of the upper extremities and hands.  I do believe that he may benefit from in-home rehab with the Neofect gloves.  I will send a prescription and get it faxed over.  Continue physical therapy regularly.  Continue physical medicine follow-up.

## 2018-12-04 NOTE — Patient Instructions (Signed)
We will fax off the prescription for the Neofect Glove order.  Continue regular exercise.  Work on a healthy diet.  Follow-up with Dr. Naaman Plummer as scheduled. Continue regular therapy.  Call the main office to schedule your lab appointment as discussed.  It was a pleasure to see you today!

## 2018-12-04 NOTE — Assessment & Plan Note (Signed)
Immunizations including HPV and tetanus up-to-date. Recommended he work on a healthy diet, limit juice, increase water.  Continue with exercise. Exam stable. Labs pending.

## 2018-12-04 NOTE — Telephone Encounter (Signed)
Noted  

## 2018-12-14 ENCOUNTER — Telehealth: Payer: Self-pay | Admitting: Primary Care

## 2018-12-14 ENCOUNTER — Encounter: Payer: BC Managed Care – PPO | Admitting: Primary Care

## 2018-12-14 NOTE — Telephone Encounter (Signed)
Called to schedule patient for labs at his convenience. Lvm asking him to call office.

## 2019-01-13 ENCOUNTER — Encounter: Payer: BC Managed Care – PPO | Admitting: Physical Medicine & Rehabilitation
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
## 2019-01-27 ENCOUNTER — Telehealth: Payer: Self-pay | Admitting: Primary Care

## 2019-01-27 DIAGNOSIS — R8290 Unspecified abnormal findings in urine: Secondary | ICD-10-CM

## 2019-01-27 NOTE — Telephone Encounter (Signed)
Patient's mother called. Patient has to be catheterized every 2 1/2 - 3 hours.  Patient has another urinary tract infection. Symptoms are smell and particles in urine and cloudy.  Patient went to urologist after the virtual visit and the urologist told patient to call PCP for antibiotics for the UTI.  Patient, also, needs a refill on Protonix.  Patient uses [facility]

## 2019-01-27 NOTE — Telephone Encounter (Signed)
Please find out his Urologist's name, call their office and clarify this message. Did they do a UA? Why can't they treat?  Also, if they didn't to a UA with the Urologist then can his mother drop off a specimen? Please give her clean catch instructions.

## 2019-01-28 ENCOUNTER — Other Ambulatory Visit: Payer: Self-pay

## 2019-01-28 ENCOUNTER — Ambulatory Visit (INDEPENDENT_AMBULATORY_CARE_PROVIDER_SITE_OTHER): Payer: BC Managed Care – PPO | Admitting: *Deleted

## 2019-01-28 DIAGNOSIS — R8290 Unspecified abnormal findings in urine: Secondary | ICD-10-CM

## 2019-01-28 LAB — POC URINALSYSI DIPSTICK (AUTOMATED)
Bilirubin, UA: NEGATIVE
Glucose, UA: NEGATIVE
Ketones, UA: NEGATIVE
Leukocytes, UA: NEGATIVE
Nitrite, UA: NEGATIVE
Protein, UA: NEGATIVE
Spec Grav, UA: >=1.03 — AB (ref 1.010–1.025)
Urobilinogen, UA: 0.2 E.U./dL
pH, UA: 6 (ref 5.0–8.0)

## 2019-01-28 NOTE — Telephone Encounter (Signed)
Noted, orders placed for UA with Culture.

## 2019-01-28 NOTE — Telephone Encounter (Signed)
Spoken to patient's mother this morning. She stated that the urologist office stated that they did treat him for a UTI.  However, the smell, urine is cloudy, and particles in the urine came back again. She was told from their office that patient need to contact PCP for additional treatment.  Mom can bring a urine sample today after catheterized this morning.  Please advise.

## 2019-01-30 LAB — URINE CULTURE
MICRO NUMBER:: 893242
SPECIMEN QUALITY:: ADEQUATE

## 2019-02-01 ENCOUNTER — Other Ambulatory Visit: Payer: Self-pay | Admitting: Primary Care

## 2019-02-01 DIAGNOSIS — N3000 Acute cystitis without hematuria: Secondary | ICD-10-CM

## 2019-02-01 MED ORDER — SULFAMETHOXAZOLE-TRIMETHOPRIM 800-160 MG PO TABS: 1.0000 | ORAL_TABLET | Freq: Two times a day (BID) | ORAL | 0 refills | Status: DC

## 2019-02-03 ENCOUNTER — Telehealth: Payer: Self-pay | Admitting: *Deleted

## 2019-02-03 ENCOUNTER — Encounter: Payer: Self-pay | Admitting: Internal Medicine

## 2019-02-03 NOTE — Telephone Encounter (Signed)
Keith [name] from [facility] left a message asking if we received a fax concerning the reorder of catheter supplies. She is asking for th physician to sign. She is asking if we could please send 2020 office notes too.  I called [facility] back and informed that patient has not been seen in clinic during 2020.  They will cal [place] back as how to proceed.

## 2019-03-12 ENCOUNTER — Encounter: Payer: Self-pay | Admitting: *Deleted

## 2019-03-17 ENCOUNTER — Encounter
Payer: BC Managed Care – PPO | Attending: Physical Medicine & Rehabilitation | Admitting: Physical Medicine & Rehabilitation

## 2019-03-18 ENCOUNTER — Ambulatory Visit: Payer: BC Managed Care – PPO | Admitting: Internal Medicine

## 2019-04-14 ENCOUNTER — Encounter: Payer: BC Managed Care – PPO | Admitting: Physical Medicine & Rehabilitation

## 2019-04-28 ENCOUNTER — Other Ambulatory Visit: Payer: Self-pay

## 2019-04-28 ENCOUNTER — Encounter: Payer: Self-pay | Admitting: Physical Medicine & Rehabilitation

## 2019-04-28 ENCOUNTER — Encounter
Payer: BC Managed Care – PPO | Attending: Physical Medicine & Rehabilitation | Admitting: Physical Medicine & Rehabilitation

## 2019-04-28 VITALS — BP 117/78 | HR 81 | Temp 98.7°F

## 2019-04-28 DIAGNOSIS — M24444 Recurrent dislocation, right finger: Secondary | ICD-10-CM | POA: Diagnosis not present

## 2019-04-28 DIAGNOSIS — K592 Neurogenic bowel, not elsewhere classified: Secondary | ICD-10-CM | POA: Diagnosis not present

## 2019-04-28 DIAGNOSIS — M792 Neuralgia and neuritis, unspecified: Secondary | ICD-10-CM | POA: Diagnosis not present

## 2019-04-28 DIAGNOSIS — S14109S Unspecified injury at unspecified level of cervical spinal cord, sequela: Secondary | ICD-10-CM

## 2019-04-28 MED ORDER — DICLOFENAC SODIUM 50 MG PO TBEC: 50.0000 mg | DELAYED_RELEASE_TABLET | Freq: Two times a day (BID) | ORAL | 3 refills | Status: DC
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
## 2019-04-28 NOTE — Patient Instructions (Signed)
WEAR SLEEVE OVER RIGHT WRIST/THUMB AT NIGHT TIME AND YOU CAN DURING THE DAY

## 2019-06-18 ENCOUNTER — Telehealth: Payer: Self-pay

## 2019-06-18 ENCOUNTER — Encounter: Payer: Self-pay | Admitting: Primary Care

## 2019-06-18 ENCOUNTER — Ambulatory Visit (INDEPENDENT_AMBULATORY_CARE_PROVIDER_SITE_OTHER): Payer: BC Managed Care – PPO | Admitting: Primary Care

## 2019-06-18 ENCOUNTER — Other Ambulatory Visit: Payer: Self-pay

## 2019-06-18 VITALS — BP 120/80 | HR 82 | Temp 97.8°F

## 2019-06-18 DIAGNOSIS — K219 Gastro-esophageal reflux disease without esophagitis: Secondary | ICD-10-CM | POA: Diagnosis not present

## 2019-06-18 DIAGNOSIS — K592 Neurogenic bowel, not elsewhere classified: Secondary | ICD-10-CM | POA: Diagnosis not present

## 2019-06-18 DIAGNOSIS — R8290 Unspecified abnormal findings in urine: Secondary | ICD-10-CM | POA: Diagnosis not present

## 2019-06-18 DIAGNOSIS — N319 Neuromuscular dysfunction of bladder, unspecified: Secondary | ICD-10-CM

## 2019-06-18 LAB — COMPREHENSIVE METABOLIC PANEL
ALT: 44 U/L (ref 0–53)
AST: 24 U/L (ref 0–37)
Albumin: 4.1 g/dL (ref 3.5–5.2)
Alkaline Phosphatase: 86 U/L (ref 39–117)
BUN: 11 mg/dL (ref 6–23)
CO2: 22 mEq/L (ref 19–32)
Calcium: 9.5 mg/dL (ref 8.4–10.5)
Chloride: 106 mEq/L (ref 96–112)
Creatinine, Ser: 0.73 mg/dL (ref 0.40–1.50)
GFR: 161.95 mL/min (ref >60.00)
Glucose, Bld: 85 mg/dL (ref 70–99)
Potassium: 4.3 mEq/L (ref 3.5–5.1)
Sodium: 136 mEq/L (ref 135–145)
Total Bilirubin: 0.6 mg/dL (ref 0.2–1.2)
Total Protein: 7.8 g/dL (ref 6.0–8.3)

## 2019-06-18 LAB — POC URINALSYSI DIPSTICK (AUTOMATED)
Bilirubin, UA: NEGATIVE
Glucose, UA: NEGATIVE
Ketones, UA: NEGATIVE
Nitrite, UA: POSITIVE
Protein, UA: NEGATIVE
Spec Grav, UA: 1.025 (ref 1.010–1.025)
Urobilinogen, UA: 0.2 E.U./dL
pH, UA: 6 (ref 5.0–8.0)

## 2019-06-18 LAB — CBC
HCT: 38.6 % — ABNORMAL LOW (ref 39.0–52.0)
Hemoglobin: 12.9 g/dL — ABNORMAL LOW (ref 13.0–17.0)
MCHC: 33.5 g/dL (ref 30.0–36.0)
MCV: 84.3 fl (ref 78.0–100.0)
Platelets: 314 10*3/uL (ref 150.0–400.0)
RBC: 4.58 Mil/uL (ref 4.22–5.81)
RDW: 13.6 % (ref 11.5–15.5)
WBC: 5.9 10*3/uL (ref 4.0–10.5)

## 2019-06-18 MED ORDER — SULFAMETHOXAZOLE-TRIMETHOPRIM 800-160 MG PO TABS: 1.0000 | ORAL_TABLET | Freq: Two times a day (BID) | ORAL | 0 refills | Status: DC

## 2019-06-18 MED ORDER — PANTOPRAZOLE SODIUM 40 MG PO TBEC: 40.0000 mg | DELAYED_RELEASE_TABLET | Freq: Every day | ORAL | 1 refills | Status: DC

## 2019-06-18 NOTE — Assessment & Plan Note (Signed)
Intermittent constipation, using bowel regimen daily.
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
## 2019-06-18 NOTE — Assessment & Plan Note (Signed)
Mother catheterizing every 3-4 hours. Recent symptoms began a few weeks ago. UA today with 1+ leuks, positive nitrites, 1+ blood. Culture sent. Rx for Bactrim DS course sent to pharmacy.

## 2019-06-18 NOTE — Telephone Encounter (Signed)
Patient evaluated.  

## 2019-06-18 NOTE — Patient Instructions (Signed)
Stop by the lab prior to leaving today. I will notify you of your results once received.   Start Bactrim DS (sulfamethoxazole/trimethoprim) tablets for urinary tract infection. Take 1 tablet by mouth twice daily for 5 days.  It was a pleasure to see you today!
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
## 2019-06-21 ENCOUNTER — Ambulatory Visit: Payer: BC Managed Care – PPO | Admitting: Primary Care

## 2019-06-21 LAB — URINE CULTURE
MICRO NUMBER:: 10121613
SPECIMEN QUALITY:: ADEQUATE

## 2019-08-02 ENCOUNTER — Other Ambulatory Visit: Payer: BC Managed Care – PPO

## 2019-08-05 ENCOUNTER — Other Ambulatory Visit: Payer: Self-pay

## 2019-08-05 ENCOUNTER — Encounter: Payer: Self-pay | Admitting: Primary Care

## 2019-08-05 ENCOUNTER — Ambulatory Visit (INDEPENDENT_AMBULATORY_CARE_PROVIDER_SITE_OTHER): Payer: BC Managed Care – PPO | Admitting: Primary Care

## 2019-08-05 VITALS — BP 112/70 | HR 76 | Temp 96.6°F

## 2019-08-05 DIAGNOSIS — K219 Gastro-esophageal reflux disease without esophagitis: Secondary | ICD-10-CM

## 2019-08-05 DIAGNOSIS — K592 Neurogenic bowel, not elsewhere classified: Secondary | ICD-10-CM

## 2019-08-05 DIAGNOSIS — Z0000 Encounter for general adult medical examination without abnormal findings: Secondary | ICD-10-CM | POA: Diagnosis not present

## 2019-08-05 DIAGNOSIS — M792 Neuralgia and neuritis, unspecified: Secondary | ICD-10-CM

## 2019-08-05 DIAGNOSIS — L600 Ingrowing nail: Secondary | ICD-10-CM | POA: Insufficient documentation

## 2019-08-05 DIAGNOSIS — G8220 Paraplegia, unspecified: Secondary | ICD-10-CM

## 2019-08-05 DIAGNOSIS — N319 Neuromuscular dysfunction of bladder, unspecified: Secondary | ICD-10-CM

## 2019-08-05 DIAGNOSIS — M62838 Other muscle spasm: Secondary | ICD-10-CM

## 2019-08-05 HISTORY — DX: Ingrowing nail: L60.0

## 2019-08-05 LAB — LIPID PANEL
Cholesterol: 125 mg/dL (ref 0–200)
HDL: 39.3 mg/dL (ref >39.00)
LDL Cholesterol: 71 mg/dL (ref 0–99)
NonHDL: 85.84
Total CHOL/HDL Ratio: 3
Triglycerides: 75 mg/dL (ref 0.0–149.0)
VLDL: 15 mg/dL (ref 0.0–40.0)

## 2019-08-05 LAB — HEMOGLOBIN A1C: Hgb A1c MFr Bld: 5.4 % (ref 4.6–6.5)

## 2019-08-05 NOTE — Assessment & Plan Note (Signed)
Very infrequent, will discontinue pantoprazole 40 mg.  He will use Tums as needed.  He will update if nothing changes.

## 2019-08-05 NOTE — Assessment & Plan Note (Signed)
Family is intermittently catheterizing every 4 hours, no urinary symptoms since last visit.  We provided them with a urinalysis kit to take home and use if needed for symptoms return.

## 2019-08-05 NOTE — Assessment & Plan Note (Signed)
Doing well on as needed gabapentin, baclofen and tramadol.  Overall infrequent use.

## 2019-08-05 NOTE — Assessment & Plan Note (Signed)
Checking labs today including lipid panel and A1c. Discussed to stop sugary drinks, decrease portion sizes, increase exercise.

## 2019-08-05 NOTE — Assessment & Plan Note (Signed)
Noted on exam today, referral placed to podiatry for evaluation.

## 2019-08-05 NOTE — Patient Instructions (Addendum)
Stop by the lab prior to leaving today. I will notify you of your results once received.   It's important to improve your diet by reducing consumption of fast food, fried food, processed snack foods, sugary drinks. Increase consumption of fresh vegetables and fruits, whole grains, water.  Ensure you are drinking 64 ounces of water daily.  Continue to work on regular exercise.   Use the urine kit if you feel like you may have a UTI.  It was a pleasure to see you today!   Preventive Care 23-8 Years Old, Male Preventive care refers to lifestyle choices and visits with your health care provider that can promote health and wellness. This includes:  A yearly physical exam. This is also called an annual well check.  Regular dental and eye exams.  Immunizations.  Screening for certain conditions.  Healthy lifestyle choices, such as eating a healthy diet, getting regular exercise, not using drugs or products that contain nicotine and tobacco, and limiting alcohol use. What can I expect for my preventive care visit? Physical exam Your health care provider will check:  Height and weight. These may be used to calculate body mass index (BMI), which is a measurement that tells if you are at a healthy weight.  Heart rate and blood pressure.  Your skin for abnormal spots. Counseling Your health care provider may ask you questions about:  Alcohol, tobacco, and drug use.  Emotional well-being.  Home and relationship well-being.  Sexual activity.  Eating habits.  Work and work Statistician. What immunizations do I need?  Influenza (flu) vaccine  This is recommended every year. Tetanus, diphtheria, and pertussis (Tdap) vaccine  You may need a Td booster every 10 years. Varicella (chickenpox) vaccine  You may need this vaccine if you have not already been vaccinated. Human papillomavirus (HPV) vaccine  If recommended by your health care provider, you may need three doses over  6 months. Measles, mumps, and rubella (MMR) vaccine  You may need at least one dose of MMR. You may also need a second dose. Meningococcal conjugate (MenACWY) vaccine  One dose is recommended if you are 23-73 years old and a Market researcher living in a residence hall, or if you have one of several medical conditions. You may also need additional booster doses. Pneumococcal conjugate (PCV13) vaccine  You may need this if you have certain conditions and were not previously vaccinated. Pneumococcal polysaccharide (PPSV23) vaccine  You may need one or two doses if you smoke cigarettes or if you have certain conditions. Hepatitis A vaccine  You may need this if you have certain conditions or if you travel or work in places where you may be exposed to hepatitis A. Hepatitis B vaccine  You may need this if you have certain conditions or if you travel or work in places where you may be exposed to hepatitis B. Haemophilus influenzae type b (Hib) vaccine  You may need this if you have certain risk factors. You may receive vaccines as individual doses or as more than one vaccine together in one shot (combination vaccines). Talk with your health care provider about the risks and benefits of combination vaccines. What tests do I need? Blood tests  Lipid and cholesterol levels. These may be checked every 5 years starting at age 23.  Hepatitis C test.  Hepatitis B test. Screening   Diabetes screening. This is done by checking your blood sugar (glucose) after you have not eaten for a while (fasting).  Sexually transmitted  disease (STD) testing. Talk with your health care provider about your test results, treatment options, and if necessary, the need for more tests. Follow these instructions at home: Eating and drinking   Eat a diet that includes fresh fruits and vegetables, whole grains, lean protein, and low-fat dairy products.  Take vitamin and mineral supplements as  recommended by your health care provider.  Do not drink alcohol if your health care provider tells you not to drink.  If you drink alcohol: ? Limit how much you have to 0-2 drinks a day. ? Be aware of how much alcohol is in your drink. In the [place] one drink equals one 12 oz bottle of beer (355 mL), one 5 oz glass of wine (148 mL), or one 1 oz glass of hard liquor (44 mL). Lifestyle  Take daily care of your teeth and gums.  Stay active. Exercise for at least 30 minutes on 5 or more days each week.  Do not use any products that contain nicotine or tobacco, such as cigarettes, e-cigarettes, and chewing tobacco. If you need help quitting, ask your health care provider.  If you are sexually active, practice safe sex. Use a condom or other form of protection to prevent STIs (sexually transmitted infections). What's next?  Go to your health care provider once a year for a well check visit.  Ask your health care provider how often you should have your eyes and teeth checked.  Stay up to date on all vaccines. This information is not intended to replace advice given to you by your health care provider. Make sure you discuss any questions you have with your health care provider. Document Revised: 04/23/2018 Document Reviewed: 04/23/2018 Elsevier Patient Education  2020 [employer].

## 2019-08-05 NOTE — Assessment & Plan Note (Signed)
Immunizations up-to-date. Encouraged a healthy diet, increase exercise as tolerated.  Exam today unremarkable. Labs pending and also reviewed.
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
## 2019-08-05 NOTE — Assessment & Plan Note (Signed)
Overall doing well, no recent constipation. Continue oral fiber.

## 2019-08-05 NOTE — Assessment & Plan Note (Signed)
Continues to be very active with physical therapy, will start aquatic therapy soon.  He seems to make improvements each time we visit, provided encouragement today.  Continue with physical therapy.  Agree to help with his chair and lumbar support, he will find out what is needed to make this happen.

## 2019-08-05 NOTE — Assessment & Plan Note (Signed)
Doing well on as needed baclofen, continue as needed.

## 2019-09-13 ENCOUNTER — Telehealth: Payer: Self-pay | Admitting: Primary Care

## 2019-09-13 DIAGNOSIS — K219 Gastro-esophageal reflux disease without esophagitis: Secondary | ICD-10-CM

## 2019-09-13 MED ORDER — FAMOTIDINE 20 MG PO TABS: ORAL_TABLET | ORAL | 0 refills | Status: DC
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
## 2019-09-13 NOTE — Telephone Encounter (Signed)
Message left for patient to return my call.  

## 2019-09-13 NOTE — Telephone Encounter (Signed)
Noted. Rx for famotidine sent to pharmacy for daily acid reflux symptoms. She can give this to him once or twice daily for a week or two, then as needed. Okay to use Tums for periodic heartburn.

## 2019-09-15 ENCOUNTER — Other Ambulatory Visit (INDEPENDENT_AMBULATORY_CARE_PROVIDER_SITE_OTHER): Payer: BC Managed Care – PPO

## 2019-09-15 DIAGNOSIS — R8290 Unspecified abnormal findings in urine: Secondary | ICD-10-CM

## 2019-09-15 LAB — POC URINALSYSI DIPSTICK (AUTOMATED)
Bilirubin, UA: NEGATIVE
Glucose, UA: NEGATIVE
Ketones, UA: NEGATIVE
Nitrite, UA: POSITIVE
Protein, UA: POSITIVE — AB
Spec Grav, UA: >=1.03 — AB (ref 1.010–1.025)
Urobilinogen, UA: 1 E.U./dL
pH, UA: 6 (ref 5.0–8.0)

## 2019-09-15 NOTE — Telephone Encounter (Signed)
Noted. Please notify her that I will place the referral. Did the stronger heartburn medication help with heartburn in the past? Pantoprazole or omeprazole?

## 2019-09-15 NOTE — Telephone Encounter (Signed)
Spoken and notified patient's of Keith [name] comments. Also mother is requesting a referral for patient to go to [facility].

## 2019-09-15 NOTE — Telephone Encounter (Signed)
Message left for patient's mother to return my call.

## 2019-09-16 ENCOUNTER — Other Ambulatory Visit: Payer: Self-pay

## 2019-09-16 ENCOUNTER — Ambulatory Visit (INDEPENDENT_AMBULATORY_CARE_PROVIDER_SITE_OTHER): Payer: BC Managed Care – PPO | Admitting: Podiatry

## 2019-09-16 ENCOUNTER — Encounter: Payer: Self-pay | Admitting: Podiatry

## 2019-09-16 VITALS — Temp 97.5°F

## 2019-09-16 DIAGNOSIS — L600 Ingrowing nail: Secondary | ICD-10-CM

## 2019-09-16 MED ORDER — NEOMYCIN-POLYMYXIN-HC 3.5-10000-1 OT SOLN
OTIC | 0 refills | Status: DC
Start: 2019-09-16 — End: 2019-09-20

## 2019-09-16 NOTE — Patient Instructions (Signed)

## 2019-09-17 ENCOUNTER — Other Ambulatory Visit: Payer: Self-pay | Admitting: Internal Medicine

## 2019-09-17 LAB — URINE CULTURE
MICRO NUMBER:: 10442363
SPECIMEN QUALITY:: ADEQUATE

## 2019-09-17 MED ORDER — OMEPRAZOLE 20 MG PO CPDR
20.0000 mg | DELAYED_RELEASE_CAPSULE | Freq: Every day | ORAL | 0 refills | Status: DC
Start: 2019-09-17 — End: 2019-09-20

## 2019-09-17 MED ORDER — SULFAMETHOXAZOLE-TRIMETHOPRIM 400-80 MG PO TABS
1.0000 | ORAL_TABLET | Freq: Two times a day (BID) | ORAL | 0 refills | Status: DC
Start: 2019-09-17 — End: 2020-01-21

## 2019-09-17 NOTE — Telephone Encounter (Signed)
Omeprazole sent to pharmacy.

## 2019-09-17 NOTE — Telephone Encounter (Signed)
Mom says they have already called about the referral.  The stronger heartburn medication did work.  Mother says it is the Omeprazole.

## 2019-09-17 NOTE — Addendum Note (Signed)
Addended by: Lorre Munroe on: 09/17/2019 12:42 PM   Modules accepted: Orders

## 2019-09-19 ENCOUNTER — Other Ambulatory Visit: Payer: Self-pay

## 2019-09-19 ENCOUNTER — Encounter (HOSPITAL_COMMUNITY): Payer: Self-pay

## 2019-09-19 ENCOUNTER — Emergency Department (HOSPITAL_COMMUNITY)
Admission: EM | Admit: 2019-09-19 | Discharge: 2019-09-20 | Disposition: A | Discharge status: Home / Self Care | Payer: BC Managed Care – PPO | Attending: Emergency Medicine | Admitting: Emergency Medicine

## 2019-09-19 DIAGNOSIS — Z87891 Personal history of nicotine dependence: Secondary | ICD-10-CM | POA: Insufficient documentation

## 2019-09-19 DIAGNOSIS — G8220 Paraplegia, unspecified: Secondary | ICD-10-CM | POA: Diagnosis not present

## 2019-09-19 DIAGNOSIS — K219 Gastro-esophageal reflux disease without esophagitis: Secondary | ICD-10-CM | POA: Diagnosis not present

## 2019-09-19 DIAGNOSIS — G8250 Quadriplegia, unspecified: Secondary | ICD-10-CM | POA: Insufficient documentation

## 2019-09-19 DIAGNOSIS — R1013 Epigastric pain: Secondary | ICD-10-CM | POA: Diagnosis present

## 2019-09-19 LAB — CBC WITH DIFFERENTIAL/PLATELET
Abs Immature Granulocytes: 0.01 10*3/uL (ref 0.00–0.07)
Basophils Absolute: 0.1 10*3/uL (ref 0.0–0.1)
Basophils Relative: 1 %
Eosinophils Absolute: 0.2 10*3/uL (ref 0.0–0.5)
Eosinophils Relative: 4 %
HCT: 40.6 % (ref 39.0–52.0)
Hemoglobin: 13.5 g/dL (ref 13.0–17.0)
Immature Granulocytes: 0 %
Lymphocytes Relative: 40 %
Lymphs Abs: 2.3 10*3/uL (ref 0.7–4.0)
MCH: 28.9 pg (ref 26.0–34.0)
MCHC: 33.3 g/dL (ref 30.0–36.0)
MCV: 86.9 fL (ref 80.0–100.0)
Monocytes Absolute: 0.7 10*3/uL (ref 0.1–1.0)
Monocytes Relative: 12 %
Neutro Abs: 2.6 10*3/uL (ref 1.7–7.7)
Neutrophils Relative %: 43 %
Platelets: 332 10*3/uL (ref 150–400)
RBC: 4.67 MIL/uL (ref 4.22–5.81)
RDW: 13.2 % (ref 11.5–15.5)
WBC: 5.9 10*3/uL (ref 4.0–10.5)
nRBC: 0 % (ref 0.0–0.2)

## 2019-09-19 LAB — COMPREHENSIVE METABOLIC PANEL
ALT: 64 U/L — ABNORMAL HIGH (ref 0–44)
AST: 38 U/L (ref 15–41)
Albumin: 4.7 g/dL (ref 3.5–5.0)
Alkaline Phosphatase: 82 U/L (ref 38–126)
Anion gap: 9 (ref 5–15)
BUN: 8 mg/dL (ref 6–20)
CO2: 19 mmol/L — ABNORMAL LOW (ref 22–32)
Calcium: 9.4 mg/dL (ref 8.9–10.3)
Chloride: 110 mmol/L (ref 98–111)
Creatinine, Ser: 0.86 mg/dL (ref 0.61–1.24)
GFR calc Af Amer: >60 mL/min (ref >60)
GFR calc non Af Amer: >60 mL/min (ref >60)
Glucose, Bld: 97 mg/dL (ref 70–99)
Potassium: 4 mmol/L (ref 3.5–5.1)
Sodium: 138 mmol/L (ref 135–145)
Total Bilirubin: 0.5 mg/dL (ref 0.3–1.2)
Total Protein: 8.4 g/dL — ABNORMAL HIGH (ref 6.5–8.1)

## 2019-09-19 LAB — URINALYSIS, ROUTINE W REFLEX MICROSCOPIC
Bacteria, UA: NONE SEEN
Bilirubin Urine: NEGATIVE
Glucose, UA: NEGATIVE mg/dL
Hgb urine dipstick: NEGATIVE
Ketones, ur: NEGATIVE mg/dL
Nitrite: NEGATIVE
Protein, ur: NEGATIVE mg/dL
Specific Gravity, Urine: 1.018 (ref 1.005–1.030)
pH: 6 (ref 5.0–8.0)

## 2019-09-19 LAB — LIPASE, BLOOD: Lipase: 25 U/L (ref 11–51)

## 2019-09-19 MED ORDER — ALUM & MAG HYDROXIDE-SIMETH 200-200-20 MG/5ML PO SUSP
30.0000 mL | Freq: Once | ORAL | Status: AC
  Administered 2019-09-19: 30 mL via ORAL
  Filled 2019-09-19: qty 30

## 2019-09-19 MED ORDER — LIDOCAINE VISCOUS HCL 2 % MT SOLN
15.0000 mL | Freq: Once | OROMUCOSAL | Status: AC
  Administered 2019-09-19: 15 mL via ORAL
  Filled 2019-09-19: qty 15
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
## 2019-09-19 NOTE — ED Notes (Signed)
Pt advises that he will need I/O cath in approx 30 min.

## 2019-09-19 NOTE — ED Triage Notes (Signed)
BIB by ptar from home. CC of abd pain in upper R Q that radiates across.  Per PTAR Pt is paralyzed from chest down.  A/O x4.   130/84 60 16 98%.

## 2019-09-20 LAB — URINE CULTURE: Culture: <10000 — AB

## 2019-09-20 MED ORDER — OMEPRAZOLE 20 MG PO CPDR
20.0000 mg | DELAYED_RELEASE_CAPSULE | Freq: Every day | ORAL | 0 refills | Status: DC
Start: 2019-09-20 — End: 2020-01-11

## 2019-09-20 NOTE — Discharge Instructions (Addendum)
Thank you for allowing me to care for you today in the Emergency Department.   Take 1 tablet of omeprazole daily.  I have sent this medication to your pharmacy as requested.  This medication does not work immediately so it is important that you take it daily to help with your symptoms.  Famotidine or Tums can help with your symptoms when I start.   Try not to eat a couple of hours before you go to sleep as this may worsen symptoms.  Try to elevate the head of your bed at a 45 degree angle to avoid making her symptoms worse at bedtime.  Continue to take your home Bactrim for your UTI.  This medication may also cause your stomach to be more upset or cause diarrhea while you are taking medication.  Follow-up with primary care if your symptoms persist.  Return to the emergency department if you develop black or bloody vomiting or diarrhea, high fevers with abdominal pain, if you develop a high fever with worsening pain in your side or malodorous urine, or other new, concerning symptoms.

## 2019-09-27 ENCOUNTER — Other Ambulatory Visit: Payer: Self-pay

## 2019-09-27 ENCOUNTER — Encounter: Payer: BC Managed Care – PPO | Attending: Registered Nurse | Admitting: Registered Nurse

## 2019-09-27 VITALS — BP 128/80 | HR 78 | Temp 98.5°F | Ht 73.0 in | Wt 351.0 lb

## 2019-09-27 DIAGNOSIS — M62838 Other muscle spasm: Secondary | ICD-10-CM | POA: Diagnosis present

## 2019-09-27 DIAGNOSIS — G8220 Paraplegia, unspecified: Secondary | ICD-10-CM | POA: Insufficient documentation

## 2019-09-27 DIAGNOSIS — G8250 Quadriplegia, unspecified: Secondary | ICD-10-CM | POA: Diagnosis not present

## 2019-09-27 DIAGNOSIS — K592 Neurogenic bowel, not elsewhere classified: Secondary | ICD-10-CM | POA: Diagnosis not present

## 2019-09-27 DIAGNOSIS — S14109S Unspecified injury at unspecified level of cervical spinal cord, sequela: Secondary | ICD-10-CM | POA: Diagnosis not present
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
## 2019-09-30 ENCOUNTER — Encounter: Payer: Self-pay | Admitting: Registered Nurse

## 2019-10-22 ENCOUNTER — Encounter: Payer: Self-pay | Admitting: Internal Medicine

## 2019-10-22 ENCOUNTER — Other Ambulatory Visit: Payer: BC Managed Care – PPO

## 2019-10-22 ENCOUNTER — Ambulatory Visit (INDEPENDENT_AMBULATORY_CARE_PROVIDER_SITE_OTHER): Payer: BC Managed Care – PPO | Admitting: Internal Medicine

## 2019-10-22 VITALS — BP 120/80 | HR 64

## 2019-10-22 DIAGNOSIS — K219 Gastro-esophageal reflux disease without esophagitis: Secondary | ICD-10-CM | POA: Diagnosis not present

## 2019-10-22 DIAGNOSIS — R1013 Epigastric pain: Secondary | ICD-10-CM

## 2019-10-22 DIAGNOSIS — K592 Neurogenic bowel, not elsewhere classified: Secondary | ICD-10-CM | POA: Diagnosis not present

## 2019-10-22 MED ORDER — OMEPRAZOLE 40 MG PO CPDR
40.0000 mg | DELAYED_RELEASE_CAPSULE | Freq: Every day | ORAL | 3 refills | Status: DC
Start: 2019-10-22 — End: 2020-11-30

## 2019-10-22 NOTE — Patient Instructions (Addendum)
If you are age 23 or older, your body mass index should be between 23-30. Your There is no height or weight on file to calculate BMI. If this is out of the aforementioned range listed, please consider follow up with your Primary Care Provider.  If you are age 68 or younger, your body mass index should be between 19-25. Your There is no height or weight on file to calculate BMI. If this is out of the aformentioned range listed, please consider follow up with your Primary Care Provider.   Please start the omeprazole 40 mg 1 a day, can use a dose in the evening if needed.   Call in 1 month if no improvement.   Your provider has requested that you go to the basement level for lab work before leaving today. Press "B" on the elevator. The lab is located at the first door on the left as you exit the elevator.  Due to recent changes in healthcare laws, you may see the results of your imaging and laboratory studies on MyChart before your provider has had a chance to review them.  We understand that in some cases there may be results that are confusing or concerning to you. Not all laboratory results come back in the same time frame and the provider may be waiting for multiple results in order to interpret others.  Please give [place] 48 hours in order for your provider to thoroughly review all the results before contacting the office for clarification of your results.   It was a pleasure to see you today!  Dr. Vonna Kotyk [place]
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
## 2019-10-27 ENCOUNTER — Other Ambulatory Visit: Payer: BC Managed Care – PPO

## 2019-10-27 ENCOUNTER — Encounter: Payer: BC Managed Care – PPO | Attending: Registered Nurse | Admitting: Physical Medicine & Rehabilitation

## 2019-10-27 DIAGNOSIS — G8220 Paraplegia, unspecified: Secondary | ICD-10-CM | POA: Insufficient documentation

## 2019-10-27 DIAGNOSIS — M62838 Other muscle spasm: Secondary | ICD-10-CM | POA: Insufficient documentation

## 2019-10-27 DIAGNOSIS — R1013 Epigastric pain: Secondary | ICD-10-CM

## 2019-10-27 DIAGNOSIS — G8250 Quadriplegia, unspecified: Secondary | ICD-10-CM | POA: Insufficient documentation

## 2019-10-27 DIAGNOSIS — K219 Gastro-esophageal reflux disease without esophagitis: Secondary | ICD-10-CM

## 2019-10-27 DIAGNOSIS — K592 Neurogenic bowel, not elsewhere classified: Secondary | ICD-10-CM | POA: Insufficient documentation

## 2019-10-27 DIAGNOSIS — S14109S Unspecified injury at unspecified level of cervical spinal cord, sequela: Secondary | ICD-10-CM | POA: Insufficient documentation

## 2019-10-28 LAB — HELICOBACTER PYLORI  SPECIAL ANTIGEN
MICRO NUMBER:: 10598500
RESULT:: DETECTED — AB
SPECIMEN QUALITY: ADEQUATE

## 2019-11-01 ENCOUNTER — Other Ambulatory Visit: Payer: Self-pay | Admitting: Internal Medicine

## 2019-11-01 DIAGNOSIS — A048 Other specified bacterial intestinal infections: Secondary | ICD-10-CM

## 2019-11-01 MED ORDER — METRONIDAZOLE 250 MG PO TABS: 250.0000 mg | ORAL_TABLET | Freq: Two times a day (BID) | ORAL | 0 refills | Status: AC

## 2019-11-01 MED ORDER — DOXYCYCLINE HYCLATE 100 MG PO TABS
100.0000 mg | ORAL_TABLET | Freq: Two times a day (BID) | ORAL | 0 refills | Status: AC
Start: 2019-11-01 — End: 2019-11-15

## 2019-11-01 MED ORDER — BISMUTH SUBSALICYLATE 262 MG PO CHEW
524.0000 mg | CHEWABLE_TABLET | Freq: Four times a day (QID) | ORAL | 0 refills | Status: AC
Start: 2019-11-01 — End: 2019-11-15

## 2019-11-01 NOTE — Patient Instructions (Addendum)
I have spoken to patient's mother, Ms.Lizarraga to advise of positive H Pylori stool antigen. Advised that patient will need to be treated with antibotics to eradicate the bacteria. I have gone over medication breakdown with patient's mother in detail as well as the fact that patient will need to take omeprazole 40 mg twice daily rather than once daily as he has been. He will do this for 14 days and he can then stop the omeprazole as per Dr Rhea Belton. I advised that pepto can cause black stool and I advised against drinking any alcohol etc while taking metronidazole. In addition, I advised that patient will need repeat stool testing 8 weeks after completion of H pylori treatment. Mother verbalizes understanding.

## 2019-11-01 NOTE — Progress Notes (Signed)
Breakdown is as follows (all x 14 days, then discontinue):  Breakfast:  *Bismuth subsalicylate chewable tablet (Pepto Bismol) 262 mg--    2 tablets  *Doxycycline 100 mg-1 tablet  *Metronidazole (Flagyl) 250 mg tablet-1 tablet  *Omeprazole (Prilosec) 40 mg-1 tablet  Lunch:  *Bismuth subsalicylate chewable tablet (Pepto Bismol) 262 mg--    2 tablets   Dinner: *Bismuth subsalicylate chewable tablet (Pepto Bismol) 262 mg--    2 tablets  *Doxycycline 100 mg- 1 tablet  *Metronidazole (Flagyl) 250 mg tablet-1 tablet  *Omeprazole (Prilosec) 40 mg-1 tablet  Bedtime: *Bismuth subsalicylate chewable tablet (Pepto Bismol) 262 mg--   2 tablets ------------------------------------------------------------------- I have spoken to patient's mother, Ms.Vasko to advise of positive H Pylori stool antigen. Advised that patient will need to be treated with antibotics to eradicate the bacteria. I have gone over medication breakdown with patient's mother in detail as well as the fact that patient will need to take omeprazole 40 mg twice daily rather than once daily as he has been. He will do this for 14 days and he can then stop the omeprazole as per Dr Rhea Belton. I advised that pepto can cause black stool and I advised against drinking any alcohol etc while taking metronidazole. In addition, I advised that patient will need repeat stool testing 8 weeks after completion of H pylori treatment. Mother verbalizes understanding.

## 2019-12-20 ENCOUNTER — Other Ambulatory Visit: Payer: Self-pay | Admitting: Primary Care

## 2019-12-20 DIAGNOSIS — G8250 Quadriplegia, unspecified: Secondary | ICD-10-CM

## 2019-12-20 DIAGNOSIS — M62838 Other muscle spasm: Secondary | ICD-10-CM

## 2020-01-11 ENCOUNTER — Other Ambulatory Visit: Payer: Self-pay

## 2020-01-11 ENCOUNTER — Ambulatory Visit (INDEPENDENT_AMBULATORY_CARE_PROVIDER_SITE_OTHER): Payer: Medicare Other | Admitting: Primary Care

## 2020-01-11 ENCOUNTER — Encounter: Payer: Self-pay | Admitting: Primary Care

## 2020-01-11 VITALS — BP 110/60 | HR 85 | Ht 73.0 in

## 2020-01-11 DIAGNOSIS — F4323 Adjustment disorder with mixed anxiety and depressed mood: Secondary | ICD-10-CM

## 2020-01-11 NOTE — Assessment & Plan Note (Signed)
Chronic symptoms for 3-4 months with GAD 7 score of 13 and PHQ 9 score of 15 today. Denies SI/HI.   Discussed options for treatment, he opts for therapy. Referral placed for therapy.
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
## 2020-01-11 NOTE — Patient Instructions (Signed)
You will be contacted regarding your referral to therapy.  Please let us know if you have not been contacted within two weeks.   It was a pleasure to see you today! Happy Birthday!

## 2020-01-18 ENCOUNTER — Telehealth (INDEPENDENT_AMBULATORY_CARE_PROVIDER_SITE_OTHER): Payer: Medicare Other

## 2020-01-18 DIAGNOSIS — N3000 Acute cystitis without hematuria: Secondary | ICD-10-CM | POA: Diagnosis not present

## 2020-01-18 DIAGNOSIS — N319 Neuromuscular dysfunction of bladder, unspecified: Secondary | ICD-10-CM | POA: Diagnosis not present

## 2020-01-18 LAB — POCT URINALYSIS DIPSTICK
Bilirubin, UA: NEGATIVE
Blood, UA: NEGATIVE
Glucose, UA: NEGATIVE
Ketones, UA: NEGATIVE
Nitrite, UA: NEGATIVE
Protein, UA: POSITIVE — AB
Spec Grav, UA: >=1.03 — AB (ref 1.010–1.025)
Urobilinogen, UA: 0.2 E.U./dL
pH, UA: 6 (ref 5.0–8.0)

## 2020-01-18 NOTE — Telephone Encounter (Signed)
He doesn't need an appointment, just have them drop off a urine sample. We will need POC urine with culture.

## 2020-01-18 NOTE — Telephone Encounter (Signed)
Orders placed and test run.

## 2020-01-18 NOTE — Telephone Encounter (Signed)
Patients mother came into office with patients urine sample. Mother is wanting to know if patient can have another Rx for antibiotics for a UTI. patient was ;last seen 01/11/20. Patient is going out of town tomorrow until 01/26/20 and mom is asking if something can be sent in before tomorrow if possible    Please call and advise   patient scheduled for next avaivible virtual visit for 01/20/20 at 11 am   Patient can be better reached at this number [phone]

## 2020-01-18 NOTE — Telephone Encounter (Signed)
Attempted to reach patient regarding urine sample but no answer and unable to leave message.

## 2020-01-20 ENCOUNTER — Encounter: Payer: Medicare Other | Admitting: Primary Care

## 2020-01-20 ENCOUNTER — Encounter: Payer: Self-pay | Admitting: Primary Care

## 2020-01-20 ENCOUNTER — Other Ambulatory Visit: Payer: Self-pay

## 2020-01-20 NOTE — Progress Notes (Signed)
Patient never evaluated. We attempted numerous times to reach patient. Will take off schedule.

## 2020-01-21 ENCOUNTER — Telehealth: Payer: Self-pay

## 2020-01-21 DIAGNOSIS — N3000 Acute cystitis without hematuria: Secondary | ICD-10-CM

## 2020-01-21 LAB — URINE CULTURE
MICRO NUMBER:: 10917628
SPECIMEN QUALITY:: ADEQUATE

## 2020-01-21 MED ORDER — SULFAMETHOXAZOLE-TRIMETHOPRIM 800-160 MG PO TABS: 1.0000 | ORAL_TABLET | Freq: Two times a day (BID) | ORAL | 0 refills | Status: DC

## 2020-01-21 NOTE — Telephone Encounter (Signed)
Noted, prescription sent to pharmacy. 

## 2020-01-21 NOTE — Telephone Encounter (Signed)
Patient is coming back from [facility] tomorrow so if possible just send it to local pharmacy.

## 2020-02-18 ENCOUNTER — Telehealth: Payer: Self-pay

## 2020-02-18 NOTE — Telephone Encounter (Signed)
Need measurements of floor to seat height, armrest width(outside to outside), length, make and model of wheelchair. This is needed to know for the wheelchair to get in a vehicle.

## 2020-02-18 NOTE — Telephone Encounter (Signed)
Called Budd Palmer back to give him [facility] phone number. Father said Josh from [facility] meet patient at a PT place off [address] But was not sure exactly where. Will ask Mom and call back.

## 2020-02-18 NOTE — Telephone Encounter (Signed)
He needs to obtain this information from the physical Therapist.

## 2020-03-13 ENCOUNTER — Other Ambulatory Visit: Payer: Self-pay | Admitting: Primary Care

## 2020-03-13 DIAGNOSIS — N3000 Acute cystitis without hematuria: Secondary | ICD-10-CM

## 2020-03-14 ENCOUNTER — Other Ambulatory Visit (INDEPENDENT_AMBULATORY_CARE_PROVIDER_SITE_OTHER): Payer: Medicare Other

## 2020-03-14 ENCOUNTER — Other Ambulatory Visit: Payer: Self-pay

## 2020-03-14 ENCOUNTER — Telehealth: Payer: Self-pay | Admitting: Primary Care

## 2020-03-14 DIAGNOSIS — N3000 Acute cystitis without hematuria: Secondary | ICD-10-CM

## 2020-03-14 LAB — POC URINALSYSI DIPSTICK (AUTOMATED)
Bilirubin, UA: NEGATIVE
Blood, UA: POSITIVE
Glucose, UA: NEGATIVE
Ketones, UA: NEGATIVE
Leukocytes, UA: NEGATIVE
Nitrite, UA: NEGATIVE
Protein, UA: NEGATIVE
Spec Grav, UA: >=1.03 — AB (ref 1.010–1.025)
Urobilinogen, UA: 0.2 E.U./dL
pH, UA: 6 (ref 5.0–8.0)

## 2020-03-14 NOTE — Telephone Encounter (Signed)
Mammie Lorenzo the patients mother has dropped off a urine sample in the bucket outside. She states that he is suffering from another uti. She is wanting [place] to get another antibiotic for him. She states that it is continually recurring.

## 2020-03-14 NOTE — Telephone Encounter (Signed)
Okay to dip, but I recommend he see a Urologist for these recurrent infections. Does he have a Urologist?   Need to obtain culture as well.

## 2020-03-14 NOTE — Telephone Encounter (Signed)
Ok to do dip

## 2020-03-15 LAB — URINE CULTURE
MICRO NUMBER:: 11149022
SPECIMEN QUALITY:: ADEQUATE

## 2020-03-27 ENCOUNTER — Emergency Department (HOSPITAL_COMMUNITY): Payer: BC Managed Care – PPO

## 2020-03-27 ENCOUNTER — Emergency Department (HOSPITAL_COMMUNITY)
Admission: EM | Admit: 2020-03-27 | Discharge: 2020-03-28 | Disposition: A | Discharge status: Home / Self Care | Payer: BC Managed Care – PPO | Attending: Emergency Medicine | Admitting: Emergency Medicine

## 2020-03-27 DIAGNOSIS — R079 Chest pain, unspecified: Secondary | ICD-10-CM | POA: Diagnosis not present

## 2020-03-27 DIAGNOSIS — Z87891 Personal history of nicotine dependence: Secondary | ICD-10-CM | POA: Diagnosis not present

## 2020-03-27 DIAGNOSIS — K219 Gastro-esophageal reflux disease without esophagitis: Secondary | ICD-10-CM | POA: Diagnosis not present

## 2020-03-27 DIAGNOSIS — R1013 Epigastric pain: Secondary | ICD-10-CM | POA: Diagnosis not present

## 2020-03-27 LAB — URINALYSIS, ROUTINE W REFLEX MICROSCOPIC
Bilirubin Urine: NEGATIVE
Glucose, UA: NEGATIVE mg/dL
Hgb urine dipstick: NEGATIVE
Ketones, ur: NEGATIVE mg/dL
Nitrite: POSITIVE — AB
Protein, ur: 30 mg/dL — AB
Specific Gravity, Urine: 1.026 (ref 1.005–1.030)
pH: 7 (ref 5.0–8.0)

## 2020-03-27 LAB — COMPREHENSIVE METABOLIC PANEL
ALT: 69 U/L — ABNORMAL HIGH (ref 0–44)
AST: 33 U/L (ref 15–41)
Albumin: 3.8 g/dL (ref 3.5–5.0)
Alkaline Phosphatase: 70 U/L (ref 38–126)
Anion gap: 9 (ref 5–15)
BUN: 11 mg/dL (ref 6–20)
CO2: 21 mmol/L — ABNORMAL LOW (ref 22–32)
Calcium: 9 mg/dL (ref 8.9–10.3)
Chloride: 107 mmol/L (ref 98–111)
Creatinine, Ser: 0.71 mg/dL (ref 0.61–1.24)
GFR, Estimated: >60 mL/min (ref >60)
Glucose, Bld: 97 mg/dL (ref 70–99)
Potassium: 4.1 mmol/L (ref 3.5–5.1)
Sodium: 137 mmol/L (ref 135–145)
Total Bilirubin: 0.6 mg/dL (ref 0.3–1.2)
Total Protein: 7.2 g/dL (ref 6.5–8.1)

## 2020-03-27 LAB — CBC WITH DIFFERENTIAL/PLATELET
Abs Immature Granulocytes: 0.02 10*3/uL (ref 0.00–0.07)
Basophils Absolute: 0.1 10*3/uL (ref 0.0–0.1)
Basophils Relative: 1 %
Eosinophils Absolute: 0.3 10*3/uL (ref 0.0–0.5)
Eosinophils Relative: 4 %
HCT: 38.8 % — ABNORMAL LOW (ref 39.0–52.0)
Hemoglobin: 12.7 g/dL — ABNORMAL LOW (ref 13.0–17.0)
Immature Granulocytes: 0 %
Lymphocytes Relative: 33 %
Lymphs Abs: 2.2 10*3/uL (ref 0.7–4.0)
MCH: 28.2 pg (ref 26.0–34.0)
MCHC: 32.7 g/dL (ref 30.0–36.0)
MCV: 86 fL (ref 80.0–100.0)
Monocytes Absolute: 0.7 10*3/uL (ref 0.1–1.0)
Monocytes Relative: 11 %
Neutro Abs: 3.4 10*3/uL (ref 1.7–7.7)
Neutrophils Relative %: 51 %
Platelets: 302 10*3/uL (ref 150–400)
RBC: 4.51 MIL/uL (ref 4.22–5.81)
RDW: 13.2 % (ref 11.5–15.5)
WBC: 6.6 10*3/uL (ref 4.0–10.5)
nRBC: 0 % (ref 0.0–0.2)

## 2020-03-27 LAB — LIPASE, BLOOD: Lipase: 31 U/L (ref 11–51)

## 2020-03-27 MED ORDER — LIDOCAINE VISCOUS HCL 2 % MT SOLN
15.0000 mL | Freq: Once | OROMUCOSAL | Status: AC
  Administered 2020-03-27: 15 mL via ORAL
  Filled 2020-03-27: qty 15

## 2020-03-27 MED ORDER — ALUM & MAG HYDROXIDE-SIMETH 200-200-20 MG/5ML PO SUSP
30.0000 mL | Freq: Once | ORAL | Status: AC
  Administered 2020-03-27: 30 mL via ORAL
  Filled 2020-03-27: qty 30

## 2020-03-27 MED ORDER — SUCRALFATE 1 G PO TABS
1.0000 g | ORAL_TABLET | Freq: Three times a day (TID) | ORAL | 1 refills | Status: DC
Start: 2020-03-27 — End: 2020-12-13
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
## 2020-03-27 NOTE — ED Triage Notes (Signed)
BIB EMS from home for CP/epigastric pain x 1 hour. Acute onset. No diaphoresis. +nausea. Hx of paraplegic.

## 2020-03-27 NOTE — Discharge Instructions (Signed)
Please read and follow all provided instructions.  Your diagnoses today include:  1. Epigastric pain     Tests performed today include:  Blood cell counts and platelets  Kidney and liver function tests  Pancreas function test (called lipase)  Urine test to look for infection -- please have your doctor follow-up on this and urine culture  Ultrasound to look for gallbladder stones -- no gallstones seen  Vital signs. See below for your results today.   Medications prescribed:   Carafate - for stomach upset and to protect your stomach  Take any prescribed medications only as directed.  Home care instructions:   Follow any educational materials contained in this packet.  Continue omeprazole and famotidine as prescribed at home.   Follow-up instructions: Please follow-up with your primary care provider in the next 3 days for further evaluation of your symptoms. You should also have the urine test and culture reviewed.   Return instructions:  SEEK IMMEDIATE MEDICAL ATTENTION IF:  The pain does not go away or becomes severe   A temperature above 101F develops   Repeated vomiting occurs (multiple episodes)   The pain becomes localized to portions of the abdomen. The right side could possibly be appendicitis. In an adult, the left lower portion of the abdomen could be colitis or diverticulitis.   Blood is being passed in stools or vomit (bright red or black tarry stools)   You develop chest pain, difficulty breathing, dizziness or fainting, or become confused, poorly responsive, or inconsolable (young children)  If you have any other emergent concerns regarding your health  Additional Information: Abdominal (belly) pain can be caused by many things. Your caregiver performed an examination and possibly ordered blood/urine tests and imaging (CT scan, x-rays, ultrasound). Many cases can be observed and treated at home after initial evaluation in the emergency department. Even  though you are being discharged home, abdominal pain can be unpredictable. Therefore, you need a repeated exam if your pain does not resolve, returns, or worsens. Most patients with abdominal pain don't have to be admitted to the hospital or have surgery, but serious problems like appendicitis and gallbladder attacks can start out as nonspecific pain. Many abdominal conditions cannot be diagnosed in one visit, so follow-up evaluations are very important.  Your vital signs today were: BP 133/82   Pulse 72   Temp 97.6 F (36.4 C) (Oral)   Resp 11   SpO2 99%  If your blood pressure (bp) was elevated above 135/85 this visit, please have this repeated by your doctor within one month. --------------

## 2020-03-29 ENCOUNTER — Telehealth: Payer: Self-pay

## 2020-03-29 LAB — URINE CULTURE: Culture: >=100000 — AB

## 2020-03-29 NOTE — Telephone Encounter (Signed)
Transition Care Management Unsuccessful Follow-up Telephone Call  Date of discharge and from where:  03/28/2020 [facility]  Attempts:  1st Attempt  Reason for unsuccessful TCM follow-up call:  Left voice message

## 2020-03-29 NOTE — Telephone Encounter (Signed)
Transition Care Management Follow-up Telephone Call  Date of discharge and from where: 03/28/2020 [facility]  How have you been since you were released from the hospital? Spoke with patient mother Keith [name], stated he is doing better. She will pick up medications later this morning.   Any questions or concerns? No  Items Reviewed:  Did the pt receive and understand the discharge instructions provided? Yes   Medications obtained and verified? Picking up later this morning.   Other? No   Any new allergies since your discharge? No   Dietary orders reviewed? Yes  Do you have support at home? Yes   Home Care and Equipment/Supplies: Were home health services ordered? not applicable If so, what is the name of the agency? na  Has the agency set up a time to come to the patient's home? not applicable Were any new equipment or medical supplies ordered?  No What is the name of the medical supply agency? na Were you able to get the supplies/equipment? not applicable Do you have any questions related to the use of the equipment or supplies? No  Functional Questionnaire: (I = Independent and D = Dependent) ADLs: I  Bathing/Dressing- I  Meal Prep- I  Eating- I  Maintaining continence- I  Transferring/Ambulation- I  Managing Meds- I  Follow up appointments reviewed:   PCP Hospital f/u appt confirmed? Yes  Scheduled to see Vernona Rieger, NP on 11/22 @ 12PM.  Specialist [facility] f/u appt confirmed? No    Are transportation arrangements needed? No   If their condition worsens, is the pt aware to call PCP or go to the Emergency Dept.? Yes  Was the patient provided with contact information for the PCP's office or ED? Yes  Was to pt encouraged to call back with questions or concerns? Yes

## 2020-03-30 ENCOUNTER — Telehealth: Payer: Self-pay

## 2020-03-30 NOTE — Telephone Encounter (Signed)
Post ED Visit - Positive Culture Follow-up: Successful Patient Follow-Up  Culture assessed and recommendations reviewed by:  []  , Pharm.D. []  Enzo Bi, Pharm.D., BCPS AQ-ID []  , Pharm.D., BCPS []  Celedonio Miyamoto, Pharm.D., BCPS []  Rattan, Garvin Fila.D., BCPS, AAHIVP []  , Pharm.D., BCPS, AAHIVP []  Georgina Pillion, PharmD, BCPS []  , PharmD, BCPS []  [place], PharmD, BCPS []  [address], PharmD [facility] Pharm D Positive urine culture  [x]  Patient discharged without antimicrobial prescription and treatment is now indicated []  Organism is resistant to prescribed ED discharge antimicrobial []  Patient with positive blood cultures  Changes discussed with ED provider: Estella Husk PA New antibiotic prescription Keflex 500 mg BID x 10 days Called to [facility] [phone]  Contacted patient, date 03/30/20, time 1112   Adaiah Morken, Phillips Climes 03/30/2020, 11:05 AM

## 2020-03-30 NOTE — Progress Notes (Signed)
ED Antimicrobial Stewardship Positive Culture Follow Up   Keith [name] is an 24 y.o. male who presented to [facility] on 03/27/2020 with a chief complaint of epigastric pain, chest pain and SOB.  Chief Complaint  Patient presents with  . Abdominal Pain    epigastric    Recent Results (from the past 720 hour(s))  Urine Culture     Status: None   Collection Time: 03/14/20  4:13 PM   Specimen: Urine  Result Value Ref Range Status   MICRO NUMBER: [id number]  Final   SPECIMEN QUALITY: Adequate  Final   Sample Source URINE, CLEAN CATCH  Final   STATUS: FINAL  Final   ISOLATE 1:   Final    Growth of mixed flora was isolated, suggesting probable contamination. No further testing will be performed. If clinically indicated, recollection using a method to minimize contamination, with prompt transfer to Urine Culture Transport Tube, is  recommended.   Urine Culture     Status: Abnormal   Collection Time: 03/27/20 10:39 PM   Specimen: Urine, Random  Result Value Ref Range Status   Specimen Description URINE, RANDOM  Final   Special Requests   Final    NONE Performed at [facility], [address], [place], [place] [address]    Culture >=100,000 COLONIES/mL KLEBSIELLA PNEUMONIAE (A)  Final   Report Status 03/29/2020 FINAL  Final   Organism ID, Bacteria KLEBSIELLA PNEUMONIAE (A)  Final      Susceptibility   Klebsiella pneumoniae - MIC*    AMPICILLIN RESISTANT Resistant     CEFAZOLIN <=4 SENSITIVE Sensitive     CEFEPIME <=0.12 SENSITIVE Sensitive     CEFTRIAXONE <=0.25 SENSITIVE Sensitive     CIPROFLOXACIN <=0.25 SENSITIVE Sensitive     GENTAMICIN <=1 SENSITIVE Sensitive     IMIPENEM <=0.25 SENSITIVE Sensitive     NITROFURANTOIN 32 SENSITIVE Sensitive     TRIMETH/SULFA <=20 SENSITIVE Sensitive     AMPICILLIN/SULBACTAM 4 SENSITIVE Sensitive     PIP/TAZO <=4 SENSITIVE Sensitive     * >=100,000 COLONIES/mL KLEBSIELLA PNEUMONIAE    [x]  Patient discharged originally without  antimicrobial agent and treatment is now indicated  New antibiotic prescription: Cephalexin 500 mg twice daly x 10 days. F/u with PCP this week and return to ER for evaluation if symptoms have worsened   ED Provider: , PA-C    Harlene Salts, PharmD., BCPS, BCCCP Clinical Pharmacist Please refer to [facility] for unit-specific pharmacist  Monday - Friday phone -  [phone]

## 2020-04-03 ENCOUNTER — Ambulatory Visit (INDEPENDENT_AMBULATORY_CARE_PROVIDER_SITE_OTHER): Payer: BC Managed Care – PPO | Admitting: Primary Care

## 2020-04-03 ENCOUNTER — Telehealth: Payer: Self-pay | Admitting: Primary Care

## 2020-04-03 ENCOUNTER — Encounter: Payer: Self-pay | Admitting: Primary Care

## 2020-04-03 ENCOUNTER — Other Ambulatory Visit: Payer: Self-pay

## 2020-04-03 ENCOUNTER — Telehealth: Payer: Self-pay

## 2020-04-03 DIAGNOSIS — K219 Gastro-esophageal reflux disease without esophagitis: Secondary | ICD-10-CM

## 2020-04-03 DIAGNOSIS — K592 Neurogenic bowel, not elsewhere classified: Secondary | ICD-10-CM | POA: Diagnosis not present

## 2020-04-03 DIAGNOSIS — N390 Urinary tract infection, site not specified: Secondary | ICD-10-CM | POA: Diagnosis not present

## 2020-04-03 NOTE — Telephone Encounter (Addendum)
Joellen, please notify patient that I spoke with his GI doctor who recommended he increase omeprazole to twice daily with meals, or continue omeprazole 40 mg once daily and use the Carafate just as needed.  He does not recommend that he continue Carafate continuously for longer than 1 month.  If his breakthrough symptoms occur frequently, then would recommend increasing omeprazole to 40 mg twice daily. If not then okay to use Carafate as needed while taking omeprazole 40 mg daily.   ----- Message from Beverley Fiedler, MD sent at 04/03/2020  3:37 PM EST ----- Regarding: RE: [facility] Patient Thanks Jae Dire Options would be to increase omeprazole to 40 mg twice daily AC or used the Carafate 3 times a day on an as-needed basis.  I typically try to not use Carafate longer than a month. Thanks for the message Vonna Kotyk ----- Message ----- From: Doreene Nest, NP Sent: 04/03/2020   2:05 PM EST To: Beverley Fiedler, MD Subject: [facility] Patient                                 Hi Dr. Rhea Belton,  This patient has had frequent breakthrough GERD symptoms despite omeprazole 40 mg and famotidine 20 mg daily. During his recent ED visit he was placed on Carafate with improvement.   I reviewed his last encounter with you (June 2021) and you mentioned that you'd like to know if he was still experiencing symptoms despite omeprazole 40 mg daily. I told him that I would reach out to you as an FYI and for further recommendations.   Thanks! Mayra Reel [facility]

## 2020-04-03 NOTE — Assessment & Plan Note (Signed)
Five culture positive UTI's since September 2020, likely secondary to catheterization technique. Family is not using a sterile field or supplies as they do not have access.  Will get with our nursing staff and consult with Urology to see what can be done to prevent UTI recurrence secondary to supply contamination.  Discussed to continue to drink plenty of water, he does well with this.

## 2020-04-03 NOTE — Patient Instructions (Signed)
Continue taking omeprazole 40 mg daily for heartburn prevention. You may continue Carafate as needed for heartburn.   Please get in touch with Dr. Rhea Belton and notify him about your ongoing heartburn symptoms.  Please get in touch with [facility] to discuss recurrent UTI's.   It was a pleasure to see you today!   Food Choices for Gastroesophageal Reflux Disease, Adult When you have gastroesophageal reflux disease (GERD), the foods you eat and your eating habits are very important. Choosing the right foods can help ease your discomfort. Think about working with a nutrition specialist (dietitian) to help you make good choices. What are tips for following this plan?  Meals  Choose healthy foods that are low in fat, such as fruits, vegetables, whole grains, low-fat dairy products, and lean meat, fish, and poultry.  Eat small meals often instead of 3 large meals a day. Eat your meals slowly, and in a place where you are relaxed. Avoid bending over or lying down until 2-3 hours after eating.  Avoid eating meals 2-3 hours before bed.  Avoid drinking a lot of liquid with meals.  Cook foods using methods other than frying. Bake, grill, or broil food instead.  Avoid or limit: ? Chocolate. ? Peppermint or spearmint. ? Alcohol. ? Pepper. ? Black and decaffeinated coffee. ? Black and decaffeinated tea. ? Bubbly (carbonated) soft drinks. ? Caffeinated energy drinks and soft drinks.  Limit high-fat foods such as: ? Fatty meat or fried foods. ? Whole milk, cream, butter, or ice cream. ? Nuts and nut butters. ? Pastries, donuts, and sweets made with butter or shortening.  Avoid foods that cause symptoms. These foods may be different for everyone. Common foods that cause symptoms include: ? Tomatoes. ? Oranges, lemons, and limes. ? Peppers. ? Spicy food. ? Onions and garlic. ? Vinegar. Lifestyle  Maintain a healthy weight. Ask your doctor what weight is healthy for you. If you  need to lose weight, work with your doctor to do so safely.  Exercise for at least 30 minutes for 5 or more days each week, or as told by your doctor.  Wear loose-fitting clothes.  Do not smoke. If you need help quitting, ask your doctor.  Sleep with the head of your bed higher than your feet. Use a wedge under the mattress or blocks under the bed frame to raise the head of the bed. Summary  When you have gastroesophageal reflux disease (GERD), food and lifestyle choices are very important in easing your symptoms.  Eat small meals often instead of 3 large meals a day. Eat your meals slowly, and in a place where you are relaxed.  Limit high-fat foods such as fatty meat or fried foods.  Avoid bending over or lying down until 2-3 hours after eating.  Avoid peppermint and spearmint, caffeine, alcohol, and chocolate. This information is not intended to replace advice given to you by your health care provider. Make sure you discuss any questions you have with your health care provider. Document Revised: 08/20/2018 Document Reviewed: 06/04/2016 Elsevier Patient Education  2020 [employer].
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
## 2020-04-03 NOTE — Assessment & Plan Note (Signed)
Family compliant to catheterization every 4 hours but is not using all sterile supplies or a sterile field.  Will connect with our nursing staff to see if we can connect him with a sterile catheterization kit or provide some help to prevent recurrent UTI.

## 2020-04-03 NOTE — Telephone Encounter (Signed)
Per PCP, pt has been having recurrent UTI's and family revealed they have not been receiving sterile insertion kits any longer with the in & out caths from DME any longer because they are not covered by insurance any longer. PCP asked this nurse to look into this to see if there is any remedy to finding pt sterile supplies for cathing.   Contacted [facility], order in chart for cath supplies signed by Dr. Faith Rogue, Physiatry, in pt's chart. Contacted Kila at [employer] who reports pt should be receiving insertion kits because they are on the order and the family verifies they receive the order with a text every order delivery. Advised the family is reporting they are not getting the kits. She advised for the family to contact them so this can be rectified. She said if there are any problems with any orders they should contact them immediately. She said if any problems this nurse could contact their rep, Marylene Land, at [phone], ext [phone]. The company has been verifying orders with [place] and [place]. They have been shipping 47F prelubed caths and insertion kits.  Advised this nurse will f/u with family.   Contacted pt's father, Derek [place], and advised they should be receiving insertion kits and he will need to contact [employer] to advise them they did not receive them and he needs them shipped. Their rep is Marylene Land and her ext is [phone]. Advised this nurse will call Marylene Land and let her know but he also needs to call and let her know. Advised if any other problems with company to contact this office. [place] verbalized understanding.   Contacted [employer] but Marylene Land is out of office until 11/29 so spoke with Danna Hefty who reports it says on the invoices that the kits have been shipped and the pt has been receiving them. Advised the family says they have not been receiving them. Skylar still advised that there is no way the family has not received the kits for an entire year. He said he would send a msg to his  manager and they would f/u with the family and this nurse. Advised the pt's father will also be contacting them. Skylar verbalized understanding.   LVM for pt's father.

## 2020-04-03 NOTE — Telephone Encounter (Signed)
Received VM from [place] at [employer] who reported she spoke with pt's father who reported he is receiving catheter kit with all supplies. Will f/u with pt's father tomorrow.

## 2020-04-03 NOTE — Assessment & Plan Note (Signed)
Chronic and continued, likely due to obesity and poor dietary choices. Discussed triggers for GERD today.  Continue omeprazole 40 mg daily, continue Carafate if needed. I recommended that he touch base with his GI doctor regarding ongoing breakthrough symptoms. We will also send today's note to his GI physician.

## 2020-04-04 NOTE — Telephone Encounter (Signed)
LVM for pt's father.

## 2020-04-04 NOTE — Telephone Encounter (Signed)
Left message to return call to our office.  

## 2020-04-05 NOTE — Telephone Encounter (Signed)
Called mother ( on dpr) reviewed all information and repeated back to me. Per moms request I am sending information in my chart message so they will have for reference

## 2020-04-05 NOTE — Telephone Encounter (Signed)
Called Patient father he has received supplies for this month they  Were just using more frequently due to UTI and ran out. They have all supplies needed at this time.

## 2020-04-10 NOTE — Telephone Encounter (Signed)
Noted  

## 2020-04-19 ENCOUNTER — Other Ambulatory Visit: Payer: Self-pay | Admitting: Primary Care

## 2020-04-19 DIAGNOSIS — K219 Gastro-esophageal reflux disease without esophagitis: Secondary | ICD-10-CM

## 2020-04-20 NOTE — Telephone Encounter (Signed)
Ok to refill 

## 2020-04-20 NOTE — Telephone Encounter (Signed)
Patient is supposed to be taking omeprazole 40 mg once or twice daily for heartburn. During his last visit he notified me that famotidine was ineffective.  How is he doing with heartburn?

## 2020-04-27 NOTE — Telephone Encounter (Signed)
Left message to return call to our office.  

## 2020-05-04 NOTE — Telephone Encounter (Signed)
Left message to return call to our office.  

## 2020-05-19 ENCOUNTER — Other Ambulatory Visit: Payer: Self-pay | Admitting: Physical Medicine & Rehabilitation

## 2020-05-19 DIAGNOSIS — M24444 Recurrent dislocation, right finger: Secondary | ICD-10-CM

## 2020-07-05 ENCOUNTER — Other Ambulatory Visit: Payer: Self-pay | Admitting: Primary Care

## 2020-09-21 ENCOUNTER — Other Ambulatory Visit: Payer: Medicare Other

## 2020-09-22 ENCOUNTER — Other Ambulatory Visit: Payer: Self-pay

## 2020-09-22 ENCOUNTER — Other Ambulatory Visit (INDEPENDENT_AMBULATORY_CARE_PROVIDER_SITE_OTHER): Payer: BC Managed Care – PPO

## 2020-09-22 DIAGNOSIS — R8290 Unspecified abnormal findings in urine: Secondary | ICD-10-CM

## 2020-09-22 LAB — POC URINALSYSI DIPSTICK (AUTOMATED)
Bilirubin, UA: NEGATIVE
Glucose, UA: NEGATIVE
Leukocytes, UA: NEGATIVE
Nitrite, UA: POSITIVE
Protein, UA: POSITIVE — AB
Spec Grav, UA: >=1.03 — AB (ref 1.010–1.025)
Urobilinogen, UA: 0.2 E.U./dL
pH, UA: 5 (ref 5.0–8.0)

## 2020-09-22 NOTE — Addendum Note (Signed)
Addended by: Donnamarie Poag on: 09/22/2020 02:10 PM   Modules accepted: Orders

## 2020-09-25 LAB — URINE CULTURE
MICRO NUMBER:: 11888190
SPECIMEN QUALITY:: ADEQUATE

## 2020-09-26 ENCOUNTER — Other Ambulatory Visit: Payer: Self-pay | Admitting: Primary Care

## 2020-09-26 DIAGNOSIS — N3001 Acute cystitis with hematuria: Secondary | ICD-10-CM

## 2020-09-26 MED ORDER — CEFDINIR 300 MG PO CAPS: 300.0000 mg | ORAL_CAPSULE | Freq: Two times a day (BID) | ORAL | 0 refills | Status: DC

## 2020-10-27 ENCOUNTER — Telehealth: Payer: Self-pay | Admitting: Primary Care

## 2020-10-27 ENCOUNTER — Telehealth: Payer: Self-pay

## 2020-10-27 DIAGNOSIS — R8290 Unspecified abnormal findings in urine: Secondary | ICD-10-CM

## 2020-10-27 NOTE — Telephone Encounter (Signed)
Attempted to reach pt's mom to ask her to bring a urine sample in. No answer and VM is full.

## 2020-10-27 NOTE — Telephone Encounter (Signed)
New message    Mom at the front desk C/O urine is cloudy. Asking for strong prescription   [facility] on [facility] [address]

## 2020-10-27 NOTE — Telephone Encounter (Signed)
See other note. Needs repeat UA with culture.  He can also get in touch with his Urologist given recurrent symptoms.

## 2020-10-27 NOTE — Telephone Encounter (Signed)
Have sent message about this with notes on call to mom. Did not see this message until I had called mom. She did not mention stopping by the office.

## 2020-10-27 NOTE — Telephone Encounter (Signed)
Pt's mother called triage line and left a VM stating that pt has completed his ABO for his most recent UTI but pt's urine continues to have a very strong odor and he is urinating more frequently. Please advise pt's mother if she needs to bring a urine sample to have tested. [phone]

## 2020-10-27 NOTE — Telephone Encounter (Signed)
Patient will need repeat UA and culture.  Please have mom collect and bring by.

## 2020-10-27 NOTE — Telephone Encounter (Signed)
Spoke to mom was given abx for UTI about 3 weeks ago. Symptoms started getting better but did not go away. About a week ago they started back. States has strong odor and more frequent urination. States he is going about every 30 - 45 minutes. That is increasing over the last few days.   Denies any fever,  or nausea or vomiting.

## 2020-10-30 ENCOUNTER — Other Ambulatory Visit: Payer: Self-pay

## 2020-10-30 ENCOUNTER — Other Ambulatory Visit: Payer: Self-pay | Admitting: Internal Medicine

## 2020-10-30 ENCOUNTER — Other Ambulatory Visit (INDEPENDENT_AMBULATORY_CARE_PROVIDER_SITE_OTHER): Payer: BC Managed Care – PPO

## 2020-10-30 DIAGNOSIS — R8290 Unspecified abnormal findings in urine: Secondary | ICD-10-CM

## 2020-10-30 LAB — POC URINALSYSI DIPSTICK (AUTOMATED)
Bilirubin, UA: NEGATIVE
Blood, UA: NEGATIVE
Glucose, UA: NEGATIVE
Ketones, UA: NEGATIVE
Leukocytes, UA: NEGATIVE
Nitrite, UA: NEGATIVE
Protein, UA: NEGATIVE
Spec Grav, UA: >=1.03 — AB (ref 1.010–1.025)
Urobilinogen, UA: 1 E.U./dL
pH, UA: 6 (ref 5.0–8.0)

## 2020-10-30 NOTE — Telephone Encounter (Signed)
Mom came by and picked up cup will bring sample by to office

## 2020-11-01 ENCOUNTER — Other Ambulatory Visit: Payer: Self-pay | Admitting: Primary Care

## 2020-11-01 DIAGNOSIS — N3000 Acute cystitis without hematuria: Secondary | ICD-10-CM

## 2020-11-01 LAB — URINE CULTURE
MICRO NUMBER:: 12026945
SPECIMEN QUALITY:: ADEQUATE

## 2020-11-01 MED ORDER — SULFAMETHOXAZOLE-TRIMETHOPRIM 800-160 MG PO TABS: 1.0000 | ORAL_TABLET | Freq: Two times a day (BID) | ORAL | 0 refills | Status: AC

## 2020-11-02 ENCOUNTER — Other Ambulatory Visit: Payer: Self-pay | Admitting: Primary Care

## 2020-11-02 DIAGNOSIS — N3000 Acute cystitis without hematuria: Secondary | ICD-10-CM

## 2020-11-02 DIAGNOSIS — N3001 Acute cystitis with hematuria: Secondary | ICD-10-CM

## 2020-11-30 ENCOUNTER — Telehealth: Payer: Self-pay | Admitting: Primary Care

## 2020-11-30 DIAGNOSIS — G8250 Quadriplegia, unspecified: Secondary | ICD-10-CM

## 2020-11-30 DIAGNOSIS — K219 Gastro-esophageal reflux disease without esophagitis: Secondary | ICD-10-CM

## 2020-11-30 DIAGNOSIS — M62838 Other muscle spasm: Secondary | ICD-10-CM

## 2020-11-30 MED ORDER — OMEPRAZOLE 40 MG PO CPDR: 40.0000 mg | DELAYED_RELEASE_CAPSULE | Freq: Every day | ORAL | 0 refills | Status: DC

## 2020-11-30 NOTE — Telephone Encounter (Signed)
Looks like they have made follow up for patient.

## 2020-11-30 NOTE — Addendum Note (Signed)
Addended by: Doreene Nest on: 11/30/2020 05:04 PM   Modules accepted: Orders

## 2020-11-30 NOTE — Telephone Encounter (Addendum)
Noted.  He has not had gabapentin or baclofen refilled since 2020, and it looks like GI refills his omeprazole.  I will send a prescription in for his omeprazole. How often is he taking the baclofen muscle relaxer and the gabapentin medication for pain?  We will also plan to see him in August to scheduled

## 2020-11-30 NOTE — Telephone Encounter (Signed)
  LAST APPOINTMENT DATE:  Please schedule appointment if longer than 1 year  NEXT APPOINTMENT DATE:@Visit  date not found  MEDICATION: Baclofen 5 MG TABS, omeprazole (PRILOSEC) 40 MG capsule, gabapetin   Is the patient out of medication? yes  PHARMACY: [facility]- [place]  Let patient know to contact pharmacy at the end of the day to make sure medication is ready.  Please notify patient to allow 48-72 hours to process  Encourage patient to contact the pharmacy for refills or they can request refills through [facility]  CLINICAL FILLS OUT ALL BELOW:   LAST REFILL:  QTY:  REFILL DATE:    OTHER COMMENTS:    Okay for refill?  Please advise

## 2020-12-01 ENCOUNTER — Other Ambulatory Visit: Payer: Self-pay | Admitting: Primary Care

## 2020-12-01 DIAGNOSIS — G8250 Quadriplegia, unspecified: Secondary | ICD-10-CM

## 2020-12-01 DIAGNOSIS — M62838 Other muscle spasm: Secondary | ICD-10-CM

## 2020-12-01 NOTE — Telephone Encounter (Signed)
Left message to return call to our office.  

## 2020-12-01 NOTE — Telephone Encounter (Signed)
Did we ever get a hold of patient or mom?  It looks like gabapentin was discontinued back in May 2021 and I have not filled baclofen in a long time.  I see that he has an appointment scheduled soon which is good.  Just trying to figure out what is going on with these refills.

## 2020-12-04 NOTE — Telephone Encounter (Signed)
Called and spoke to mom. He only takes when he has therapy and had a lot left over just ran out. He does not take every time he has therapy only when it is hard or very painful. When he takes uses one 300mg  of gabapentin with one tab of baclofen. He has therapy twice a week.

## 2020-12-05 MED ORDER — BACLOFEN 5 MG PO TABS: ORAL_TABLET | ORAL | 0 refills | Status: DC

## 2020-12-05 MED ORDER — GABAPENTIN 300 MG PO CAPS: ORAL_CAPSULE | ORAL | 0 refills | Status: DC

## 2020-12-05 NOTE — Addendum Note (Signed)
Addended by: Doreene Nest on: 12/05/2020 06:43 AM   Modules accepted: Orders

## 2020-12-05 NOTE — Telephone Encounter (Signed)
Refills sent to pharmacy. 

## 2020-12-07 NOTE — Telephone Encounter (Signed)
See separate phone note- Joellen spoke with mom and medications were refilled as requested.

## 2020-12-13 ENCOUNTER — Ambulatory Visit (INDEPENDENT_AMBULATORY_CARE_PROVIDER_SITE_OTHER): Payer: BC Managed Care – PPO | Admitting: Primary Care

## 2020-12-13 ENCOUNTER — Other Ambulatory Visit: Payer: Self-pay

## 2020-12-13 ENCOUNTER — Encounter: Payer: Self-pay | Admitting: Primary Care

## 2020-12-13 VITALS — BP 118/78 | HR 62 | Temp 97.7°F

## 2020-12-13 DIAGNOSIS — K219 Gastro-esophageal reflux disease without esophagitis: Secondary | ICD-10-CM | POA: Diagnosis not present

## 2020-12-13 DIAGNOSIS — Z0000 Encounter for general adult medical examination without abnormal findings: Secondary | ICD-10-CM | POA: Diagnosis not present

## 2020-12-13 DIAGNOSIS — N319 Neuromuscular dysfunction of bladder, unspecified: Secondary | ICD-10-CM

## 2020-12-13 DIAGNOSIS — Z1159 Encounter for screening for other viral diseases: Secondary | ICD-10-CM

## 2020-12-13 DIAGNOSIS — G8250 Quadriplegia, unspecified: Secondary | ICD-10-CM

## 2020-12-13 DIAGNOSIS — L600 Ingrowing nail: Secondary | ICD-10-CM

## 2020-12-13 DIAGNOSIS — M792 Neuralgia and neuritis, unspecified: Secondary | ICD-10-CM

## 2020-12-13 DIAGNOSIS — N390 Urinary tract infection, site not specified: Secondary | ICD-10-CM

## 2020-12-13 DIAGNOSIS — K592 Neurogenic bowel, not elsewhere classified: Secondary | ICD-10-CM

## 2020-12-13 DIAGNOSIS — G8220 Paraplegia, unspecified: Secondary | ICD-10-CM

## 2020-12-13 DIAGNOSIS — F4323 Adjustment disorder with mixed anxiety and depressed mood: Secondary | ICD-10-CM

## 2020-12-13 LAB — COMPREHENSIVE METABOLIC PANEL
ALT: 42 U/L (ref 0–53)
AST: 26 U/L (ref 0–37)
Albumin: 4.2 g/dL (ref 3.5–5.2)
Alkaline Phosphatase: 74 U/L (ref 39–117)
BUN: 9 mg/dL (ref 6–23)
CO2: 19 mEq/L (ref 19–32)
Calcium: 9.2 mg/dL (ref 8.4–10.5)
Chloride: 107 mEq/L (ref 96–112)
Creatinine, Ser: 0.7 mg/dL (ref 0.40–1.50)
GFR: 129.51 mL/min (ref >60.00)
Glucose, Bld: 81 mg/dL (ref 70–99)
Potassium: 4.5 mEq/L (ref 3.5–5.1)
Sodium: 135 mEq/L (ref 135–145)
Total Bilirubin: 0.7 mg/dL (ref 0.2–1.2)
Total Protein: 8 g/dL (ref 6.0–8.3)

## 2020-12-13 LAB — LIPID PANEL
Cholesterol: 147 mg/dL (ref 0–200)
HDL: 35.6 mg/dL — ABNORMAL LOW (ref >39.00)
LDL Cholesterol: 98 mg/dL (ref 0–99)
NonHDL: 111.89
Total CHOL/HDL Ratio: 4
Triglycerides: 70 mg/dL (ref 0.0–149.0)
VLDL: 14 mg/dL (ref 0.0–40.0)

## 2020-12-13 LAB — HEMOGLOBIN A1C: Hgb A1c MFr Bld: 5.4 % (ref 4.6–6.5)

## 2020-12-13 MED ORDER — OMEPRAZOLE 40 MG PO CPDR: 40.0000 mg | DELAYED_RELEASE_CAPSULE | Freq: Every day | ORAL | 1 refills | Status: DC

## 2020-12-13 NOTE — Assessment & Plan Note (Signed)
Denies concerns today, continue to monitor. Not on medication. Continue off.
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
## 2020-12-13 NOTE — Assessment & Plan Note (Signed)
Abnormal regrowth, referral placed to podiatry.

## 2020-12-13 NOTE — Assessment & Plan Note (Signed)
Family is catheterizing every 3 hours, trying to maintain sterile technique. Four culture positive UTI's in last 365 days. Family is considering suprapubic catheter evaluation. Following with Urology.

## 2020-12-13 NOTE — Assessment & Plan Note (Signed)
Immunizations UTD. ° °Discussed the importance of a healthy diet and regular exercise in order for weight loss, and to reduce the risk of further co-morbidity. ° °Exam today stable. °Labs pending. °

## 2020-12-13 NOTE — Patient Instructions (Signed)
Stop by the lab prior to leaving today. I will notify you of your results once received.   You will be contacted regarding your referral to podiatry.  Please let us know if you have not been contacted within two weeks.   Try to use your omeprazole once daily rather than twice daily if you can. This is for heartburn.  It was a pleasure to see you today!  Preventive Care 24-24 Years Old, Male Preventive care refers to lifestyle choices and visits with your health care provider that can promote health and wellness. This includes: A yearly physical exam. This is also called an annual wellness visit. Regular dental and eye exams. Immunizations. Screening for certain conditions. Healthy lifestyle choices, such as: Eating a healthy diet. Getting regular exercise. Not using drugs or products that contain nicotine and tobacco. Limiting alcohol use. What can I expect for my preventive care visit? Physical exam Your health care provider may check your: Height and weight. These may be used to calculate your BMI (body mass index). BMI is a measurement that tells if you are at a healthy weight. Heart rate and blood pressure. Body temperature. Skin for abnormal spots. Counseling Your health care provider may ask you questions about your: Past medical problems. Family's medical history. Alcohol, tobacco, and drug use. Emotional well-being. Home life and relationship well-being. Sexual activity. Diet, exercise, and sleep habits. Work and work Astronomer. Access to firearms. What immunizations do I need?  Vaccines are usually given at various ages, according to a schedule. Your health care provider will recommend vaccines for you based on your age, medicalhistory, and lifestyle or other factors, such as travel or where you work. What tests do I need? Blood tests Lipid and cholesterol levels. These may be checked every 5 years starting at age 24. Hepatitis C test. Hepatitis B  test. Screening  Diabetes screening. This is done by checking your blood sugar (glucose) after you have not eaten for a while (fasting). Genital exam to check for testicular cancer or hernias. STD (sexually transmitted disease) testing, if you are at risk. Talk with your health care provider about your test results, treatment options,and if necessary, the need for more tests. Follow these instructions at home: Eating and drinking  Eat a healthy diet that includes fresh fruits and vegetables, whole grains, lean protein, and low-fat dairy products. Drink enough fluid to keep your urine pale yellow. Take vitamin and mineral supplements as recommended by your health care provider. Do not drink alcohol if your health care provider tells you not to drink. If you drink alcohol: Limit how much you have to 0-2 drinks a day. Be aware of how much alcohol is in your drink. In the [place] one drink equals one 12 oz bottle of beer (355 mL), one 5 oz glass of wine (148 mL), or one 1 oz glass of hard liquor (44 mL).  Lifestyle Take daily care of your teeth and gums. Brush your teeth every morning and night with fluoride toothpaste. Floss one time each day. Stay active. Exercise for at least 30 minutes 5 or more days each week. Do not use any products that contain nicotine or tobacco, such as cigarettes, e-cigarettes, and chewing tobacco. If you need help quitting, ask your health care provider. Do not use drugs. If you are sexually active, practice safe sex. Use a condom or other form of protection to prevent STIs (sexually transmitted infections). Find healthy ways to cope with stress, such as: Meditation, yoga, or listening  to music. Journaling. Talking to a trusted person. Spending time with friends and family. Safety Always wear your seat belt while driving or riding in a vehicle. Do not drive: If you have been drinking alcohol. Do not ride with someone who has been drinking. When you are tired  or distracted. While texting. Wear a helmet and other protective equipment during sports activities. If you have firearms in your house, make sure you follow all gun safety procedures. Seek help if you have been physically or sexually abused. What's next? Go to your health care provider once a year for an annual wellness visit. Ask your health care provider how often you should have your eyes and teeth checked. Stay up to date on all vaccines. This information is not intended to replace advice given to you by your health care provider. Make sure you discuss any questions you have with your healthcare provider. Document Revised: 01/13/2019 Document Reviewed: 04/23/2018 Elsevier Patient Education  2022 [employer].

## 2020-12-13 NOTE — Assessment & Plan Note (Signed)
Improving, encouraged daily exercise, weight loss. Continue home PT.  Continue PRN gabapentin and baclofen.

## 2020-12-13 NOTE — Assessment & Plan Note (Signed)
Improving, continue home PT and exercise. Continue gabapentin and baclofen PRN.

## 2020-12-13 NOTE — Assessment & Plan Note (Signed)
Taking omeprazole 40 mg BID per patient, encouraged to reduce to once daily. Refills provided.

## 2020-12-13 NOTE — Assessment & Plan Note (Signed)
Four culture positive UTI's within 365 days. Family is considering suprapubic catheter insertion. Following with Urology

## 2020-12-13 NOTE — Assessment & Plan Note (Addendum)
Chronic, no changes.  Regular movements.

## 2020-12-13 NOTE — Assessment & Plan Note (Signed)
Chronic, stable. Using gabapentin and baclofen sparingly, continue same.

## 2020-12-13 NOTE — Assessment & Plan Note (Signed)
Checking lipids and A1C today. Family history of diabetes in father.

## 2020-12-14 LAB — HEPATITIS C ANTIBODY
Hepatitis C Ab: NONREACTIVE
SIGNAL TO CUT-OFF: 0.02 (ref <1.00)

## 2020-12-18 ENCOUNTER — Encounter: Payer: Self-pay | Admitting: Primary Care

## 2020-12-21 ENCOUNTER — Other Ambulatory Visit: Payer: Self-pay

## 2020-12-21 ENCOUNTER — Ambulatory Visit (INDEPENDENT_AMBULATORY_CARE_PROVIDER_SITE_OTHER): Payer: BC Managed Care – PPO | Admitting: Podiatry

## 2020-12-21 DIAGNOSIS — L600 Ingrowing nail: Secondary | ICD-10-CM

## 2020-12-21 NOTE — Patient Instructions (Signed)
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
## 2021-01-08 ENCOUNTER — Other Ambulatory Visit: Payer: Self-pay | Admitting: Primary Care

## 2021-01-08 DIAGNOSIS — G8250 Quadriplegia, unspecified: Secondary | ICD-10-CM

## 2021-01-08 DIAGNOSIS — M62838 Other muscle spasm: Secondary | ICD-10-CM

## 2021-05-30 ENCOUNTER — Ambulatory Visit: Payer: Medicare Other | Admitting: Primary Care

## 2021-06-06 ENCOUNTER — Ambulatory Visit: Payer: Medicare Other | Admitting: Primary Care

## 2021-07-24 ENCOUNTER — Encounter: Payer: Self-pay | Admitting: Family

## 2021-07-24 ENCOUNTER — Ambulatory Visit (INDEPENDENT_AMBULATORY_CARE_PROVIDER_SITE_OTHER): Payer: Medicare Other | Admitting: Family

## 2021-07-24 ENCOUNTER — Other Ambulatory Visit: Payer: Self-pay | Admitting: Family

## 2021-07-24 ENCOUNTER — Other Ambulatory Visit: Payer: Self-pay

## 2021-07-24 VITALS — BP 122/78 | HR 78

## 2021-07-24 DIAGNOSIS — R3915 Urgency of urination: Secondary | ICD-10-CM | POA: Insufficient documentation

## 2021-07-24 DIAGNOSIS — N3000 Acute cystitis without hematuria: Secondary | ICD-10-CM | POA: Insufficient documentation

## 2021-07-24 DIAGNOSIS — R82998 Other abnormal findings in urine: Secondary | ICD-10-CM

## 2021-07-24 DIAGNOSIS — R822 Biliuria: Secondary | ICD-10-CM

## 2021-07-24 HISTORY — DX: Acute cystitis without hematuria: N30.00

## 2021-07-24 LAB — POCT URINALYSIS DIPSTICK
Bilirubin, UA: NEGATIVE
Blood, UA: NEGATIVE
Glucose, UA: NEGATIVE
Ketones, UA: NEGATIVE
Nitrite, UA: NEGATIVE
Protein, UA: POSITIVE — AB
Spec Grav, UA: 1.015 (ref 1.010–1.025)
Urobilinogen, UA: 2 E.U./dL — AB
pH, UA: 7.5 (ref 5.0–8.0)

## 2021-07-24 LAB — URINALYSIS, ROUTINE W REFLEX MICROSCOPIC
Bilirubin Urine: NEGATIVE
Hgb urine dipstick: NEGATIVE
Ketones, ur: NEGATIVE
Nitrite: NEGATIVE
RBC / HPF: NONE SEEN (ref 0–?)
Specific Gravity, Urine: 1.015 (ref 1.000–1.030)
Total Protein, Urine: NEGATIVE
Urine Glucose: NEGATIVE
Urobilinogen, UA: 4 — AB (ref 0.0–1.0)
pH: 7.5 (ref 5.0–8.0)

## 2021-07-24 MED ORDER — CIPROFLOXACIN HCL 500 MG PO TABS: 500.0000 mg | ORAL_TABLET | Freq: Two times a day (BID) | ORAL | 0 refills | Status: AC
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
## 2021-07-24 NOTE — Assessment & Plan Note (Signed)
poct urine today in office. ?Urinalysis and culture ordered pending results. ?

## 2021-07-24 NOTE — Assessment & Plan Note (Signed)
Choosing to treat with symptoms alone, sent rx for cipro 500 mg po bid x 7 days #14. ?Increase oral water intake.  ?Informed to f/u with urology as recurrent UTIs ?

## 2021-07-27 LAB — URINE CULTURE
MICRO NUMBER:: 13128564
SPECIMEN QUALITY:: ADEQUATE

## 2021-09-12 ENCOUNTER — Encounter
Payer: BC Managed Care – PPO | Attending: Physical Medicine & Rehabilitation | Admitting: Physical Medicine & Rehabilitation

## 2021-09-12 ENCOUNTER — Encounter: Payer: Self-pay | Admitting: Physical Medicine & Rehabilitation

## 2021-09-12 VITALS — BP 106/67 | HR 58 | Ht 73.0 in

## 2021-09-12 DIAGNOSIS — K592 Neurogenic bowel, not elsewhere classified: Secondary | ICD-10-CM | POA: Diagnosis present

## 2021-09-12 DIAGNOSIS — G8250 Quadriplegia, unspecified: Secondary | ICD-10-CM | POA: Diagnosis present

## 2021-09-12 DIAGNOSIS — N319 Neuromuscular dysfunction of bladder, unspecified: Secondary | ICD-10-CM | POA: Diagnosis present

## 2021-09-12 DIAGNOSIS — M792 Neuralgia and neuritis, unspecified: Secondary | ICD-10-CM | POA: Diagnosis present
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
## 2021-09-12 NOTE — Patient Instructions (Signed)
BOWEL PROGRAM: ? ?TRY A DULCOLAX SUPPOSITORY ?

## 2021-09-17 ENCOUNTER — Telehealth: Payer: Self-pay | Admitting: Primary Care

## 2021-09-17 NOTE — Telephone Encounter (Signed)
LVM for pt to rtn my call to schedule AWV with [facility]. Please schedule AWV if pt calls the office  

## 2021-11-30 ENCOUNTER — Telehealth: Payer: Self-pay | Admitting: Primary Care

## 2021-11-30 DIAGNOSIS — G8250 Quadriplegia, unspecified: Secondary | ICD-10-CM

## 2021-11-30 DIAGNOSIS — K219 Gastro-esophageal reflux disease without esophagitis: Secondary | ICD-10-CM

## 2021-11-30 DIAGNOSIS — M62838 Other muscle spasm: Secondary | ICD-10-CM

## 2021-11-30 MED ORDER — OMEPRAZOLE 40 MG PO CPDR: 40.0000 mg | DELAYED_RELEASE_CAPSULE | Freq: Every day | ORAL | 0 refills | Status: DC

## 2021-11-30 MED ORDER — GABAPENTIN 300 MG PO CAPS: ORAL_CAPSULE | ORAL | 0 refills | Status: DC

## 2021-11-30 NOTE — Telephone Encounter (Signed)
  Encourage patient to contact the pharmacy for refills or they can request refills through [facility]  Did the patient contact the pharmacy: No   LAST APPOINTMENT DATE: 07/24/21  NEXT APPOINTMENT DATE: N/A  MEDICATION: gabapentin (NEURONTIN) 300 MG capsule  omeprazole (PRILOSEC) 40 MG capsule  Is the patient out of medication? Yes, out of both  PHARMACY: [id number] - [place], [place] - [address] AT [facility] OF [address]  Let patient know to contact pharmacy at the end of the day to make sure medication is ready.  Please notify patient to allow 48-72 hours to process

## 2021-11-30 NOTE — Telephone Encounter (Signed)
Do you want call something in or do come to be seen

## 2021-11-30 NOTE — Telephone Encounter (Signed)
Patient is due for follow-up in August 2023 and will need to be seen for further refills of his medications.  Please schedule.

## 2021-11-30 NOTE — Telephone Encounter (Signed)
Patient needs office visit.  

## 2021-11-30 NOTE — Telephone Encounter (Signed)
Patient mom Mammie Lorenzo called in stating that Keith [name] has ringworm and its spreaded to his hair, on his elbow, and spots on his body. She was wanting to get a prescription sent out for him if possible. Please advise. Thank  you!

## 2021-11-30 NOTE — Telephone Encounter (Signed)
forwarding

## 2021-11-30 NOTE — Addendum Note (Signed)
Addended by: Doreene Nest on: 11/30/2021 04:41 PM   Modules accepted: Orders

## 2021-12-03 NOTE — Telephone Encounter (Signed)
Called mom to schedule follow up for August. Mom stated that he has a ringwork on his face that is spreading really bad and irritating him,and would like to know if cream could be called in for him?

## 2021-12-03 NOTE — Telephone Encounter (Signed)
Patient needs evaluation for this. Please schedule.

## 2021-12-04 NOTE — Telephone Encounter (Signed)
Called patient set up for office visit.  No further action needed at this time.

## 2021-12-05 ENCOUNTER — Ambulatory Visit (INDEPENDENT_AMBULATORY_CARE_PROVIDER_SITE_OTHER): Payer: Medicare Other | Admitting: Primary Care

## 2021-12-05 ENCOUNTER — Ambulatory Visit (INDEPENDENT_AMBULATORY_CARE_PROVIDER_SITE_OTHER)
Admission: RE | Admit: 2021-12-05 | Discharge: 2021-12-05 | Disposition: A | Discharge status: Home / Self Care | Payer: Medicare Other | Source: Ambulatory Visit | Attending: Primary Care | Admitting: Primary Care

## 2021-12-05 ENCOUNTER — Encounter: Payer: Self-pay | Admitting: Primary Care

## 2021-12-05 VITALS — BP 126/78 | HR 78 | Temp 98.7°F

## 2021-12-05 DIAGNOSIS — R21 Rash and other nonspecific skin eruption: Secondary | ICD-10-CM | POA: Diagnosis not present

## 2021-12-05 DIAGNOSIS — N390 Urinary tract infection, site not specified: Secondary | ICD-10-CM

## 2021-12-05 DIAGNOSIS — G8220 Paraplegia, unspecified: Secondary | ICD-10-CM

## 2021-12-05 DIAGNOSIS — G8250 Quadriplegia, unspecified: Secondary | ICD-10-CM | POA: Diagnosis not present

## 2021-12-05 DIAGNOSIS — M62838 Other muscle spasm: Secondary | ICD-10-CM

## 2021-12-05 DIAGNOSIS — Z87828 Personal history of other (healed) physical injury and trauma: Secondary | ICD-10-CM | POA: Diagnosis not present

## 2021-12-05 DIAGNOSIS — R350 Frequency of micturition: Secondary | ICD-10-CM

## 2021-12-05 DIAGNOSIS — F4323 Adjustment disorder with mixed anxiety and depressed mood: Secondary | ICD-10-CM

## 2021-12-05 DIAGNOSIS — N319 Neuromuscular dysfunction of bladder, unspecified: Secondary | ICD-10-CM

## 2021-12-05 DIAGNOSIS — M792 Neuralgia and neuritis, unspecified: Secondary | ICD-10-CM

## 2021-12-05 DIAGNOSIS — K219 Gastro-esophageal reflux disease without esophagitis: Secondary | ICD-10-CM

## 2021-12-05 LAB — POC URINALSYSI DIPSTICK (AUTOMATED)
Bilirubin, UA: NEGATIVE
Blood, UA: NEGATIVE
Glucose, UA: NEGATIVE
Ketones, UA: NEGATIVE
Nitrite, UA: NEGATIVE
Protein, UA: NEGATIVE
Spec Grav, UA: 1.015 (ref 1.010–1.025)
Urobilinogen, UA: 1 E.U./dL
pH, UA: 8 (ref 5.0–8.0)

## 2021-12-05 MED ORDER — PREDNISONE 20 MG PO TABS: ORAL_TABLET | ORAL | 0 refills | Status: DC

## 2021-12-05 MED ORDER — BACLOFEN 5 MG PO TABS
1.0000 | ORAL_TABLET | Freq: Every evening | ORAL | 0 refills | Status: DC | PRN
Start: 2021-12-05 — End: 2022-12-31

## 2021-12-05 NOTE — Assessment & Plan Note (Signed)
Denies concerns today. °Continue to monitor. °

## 2021-12-05 NOTE — Patient Instructions (Signed)
Start prednisone medication for rash.  Take 3 tablets by mouth daily for 2 days, then take 2 tablets for 3 days, then take 1 tablet for 3 days.  You will be contacted regarding your referral to physical therapy.  Please let us know if you have not been contacted within two weeks.   Complete xray(s) prior to leaving today. I will notify you of your results once received.  Try taking the baclofen muscle relaxer medication daily for the next week or two.  It was a pleasure to see you today!
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
## 2021-12-05 NOTE — Assessment & Plan Note (Signed)
Mother catheterizes patient at least 6 times daily. Follow with urology.
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
## 2021-12-05 NOTE — Assessment & Plan Note (Signed)
Interesting presentation today.  His rash does somewhat represent fungal etiology. No obvious scabies.  Prescription for prednisone taper 60 to 20 mg sent to pharmacy. Discussed directions with mom.  Return precautions provided.

## 2021-12-05 NOTE — Assessment & Plan Note (Signed)
Overall controlled.  Continue gabapentin 100 mg daily as needed.

## 2021-12-05 NOTE — Assessment & Plan Note (Signed)
Upper extremity movement appears to have improved since last visit. Referral placed for local physical therapy group so that he does not have to travel to [place].

## 2021-12-05 NOTE — Assessment & Plan Note (Signed)
Controlled.  Continue omeprazole 40 mg daily. 

## 2021-12-05 NOTE — Assessment & Plan Note (Signed)
Following with urology.  Continue frequent catheterization.

## 2021-12-05 NOTE — Assessment & Plan Note (Signed)
UA today with 1+ leuks, otherwise negative. Culture sent and pending.  Await culture results prior to treatment.

## 2021-12-07 ENCOUNTER — Other Ambulatory Visit: Payer: Self-pay | Admitting: Primary Care

## 2021-12-07 DIAGNOSIS — N3000 Acute cystitis without hematuria: Secondary | ICD-10-CM

## 2021-12-07 LAB — URINE CULTURE
MICRO NUMBER:: 13697272
SPECIMEN QUALITY:: ADEQUATE

## 2021-12-07 MED ORDER — SULFAMETHOXAZOLE-TRIMETHOPRIM 800-160 MG PO TABS: 1.0000 | ORAL_TABLET | Freq: Two times a day (BID) | ORAL | 0 refills | Status: DC

## 2021-12-11 ENCOUNTER — Ambulatory Visit: Payer: BC Managed Care – PPO | Attending: Internal Medicine

## 2021-12-11 DIAGNOSIS — G8250 Quadriplegia, unspecified: Secondary | ICD-10-CM | POA: Insufficient documentation

## 2021-12-11 DIAGNOSIS — M25511 Pain in right shoulder: Secondary | ICD-10-CM | POA: Insufficient documentation

## 2021-12-11 DIAGNOSIS — G8220 Paraplegia, unspecified: Secondary | ICD-10-CM | POA: Insufficient documentation

## 2021-12-11 DIAGNOSIS — M6281 Muscle weakness (generalized): Secondary | ICD-10-CM | POA: Insufficient documentation

## 2021-12-11 DIAGNOSIS — R29898 Other symptoms and signs involving the musculoskeletal system: Secondary | ICD-10-CM | POA: Insufficient documentation
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
## 2021-12-18 ENCOUNTER — Ambulatory Visit: Payer: Medicare Other | Admitting: Primary Care

## 2021-12-18 ENCOUNTER — Encounter: Payer: Self-pay | Admitting: Physical Therapy

## 2021-12-18 ENCOUNTER — Ambulatory Visit: Payer: BC Managed Care – PPO | Admitting: Physical Therapy

## 2021-12-18 DIAGNOSIS — R29898 Other symptoms and signs involving the musculoskeletal system: Secondary | ICD-10-CM

## 2021-12-18 DIAGNOSIS — M6281 Muscle weakness (generalized): Secondary | ICD-10-CM | POA: Diagnosis not present

## 2021-12-18 DIAGNOSIS — M25511 Pain in right shoulder: Secondary | ICD-10-CM

## 2021-12-18 DIAGNOSIS — G8250 Quadriplegia, unspecified: Secondary | ICD-10-CM

## 2021-12-18 NOTE — Patient Instructions (Addendum)
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
## 2021-12-25 ENCOUNTER — Encounter: Payer: Self-pay | Admitting: Physical Therapy

## 2021-12-25 ENCOUNTER — Telehealth: Payer: Medicare Other | Admitting: Family Medicine

## 2021-12-25 ENCOUNTER — Ambulatory Visit: Payer: BC Managed Care – PPO | Admitting: Physical Therapy

## 2021-12-25 DIAGNOSIS — M25511 Pain in right shoulder: Secondary | ICD-10-CM

## 2021-12-25 DIAGNOSIS — M6281 Muscle weakness (generalized): Secondary | ICD-10-CM

## 2021-12-25 DIAGNOSIS — G8250 Quadriplegia, unspecified: Secondary | ICD-10-CM

## 2021-12-25 DIAGNOSIS — R29898 Other symptoms and signs involving the musculoskeletal system: Secondary | ICD-10-CM
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
## 2022-01-01 ENCOUNTER — Encounter: Payer: Self-pay | Admitting: Physical Therapy

## 2022-01-01 ENCOUNTER — Ambulatory Visit: Payer: BC Managed Care – PPO | Admitting: Physical Therapy

## 2022-01-01 DIAGNOSIS — M25511 Pain in right shoulder: Secondary | ICD-10-CM

## 2022-01-01 DIAGNOSIS — R29898 Other symptoms and signs involving the musculoskeletal system: Secondary | ICD-10-CM

## 2022-01-01 DIAGNOSIS — M6281 Muscle weakness (generalized): Secondary | ICD-10-CM | POA: Diagnosis not present

## 2022-01-03 ENCOUNTER — Telehealth: Payer: Self-pay | Admitting: Primary Care

## 2022-01-03 NOTE — Telephone Encounter (Signed)
Patient mom Keith [name] called in stating that he has a sore on his elbow that will not go away. She has been keeping it bandaged up, it isn't oozing or anything, it just will not go away. She was wondering do he need to come in or if something can be sent over to the pharmacy for this. Please advise. Thank you!

## 2022-01-03 NOTE — Telephone Encounter (Signed)
Patient will need to be seen in office. Tried to call but received message that  call could not be completed at this time.

## 2022-01-04 NOTE — Telephone Encounter (Signed)
Called both numbers no answer/no voicemail 

## 2022-01-07 NOTE — Telephone Encounter (Signed)
Spoke to patient by telephone and was advised that he has had the sore on his elbow for several weeks. Patient was offered an appointment tomorrow which  he declined stating that he has another appointment that day. Patient scheduled for an office visit 01/09/22 at 10:40 am with Audria Nine NP.

## 2022-01-07 NOTE — Telephone Encounter (Signed)
Will evaluate in office

## 2022-01-08 ENCOUNTER — Encounter: Payer: Self-pay | Admitting: Physical Therapy

## 2022-01-08 ENCOUNTER — Ambulatory Visit: Payer: BC Managed Care – PPO | Admitting: Physical Therapy

## 2022-01-08 DIAGNOSIS — M6281 Muscle weakness (generalized): Secondary | ICD-10-CM | POA: Diagnosis not present

## 2022-01-08 DIAGNOSIS — M25511 Pain in right shoulder: Secondary | ICD-10-CM

## 2022-01-08 DIAGNOSIS — R29898 Other symptoms and signs involving the musculoskeletal system: Secondary | ICD-10-CM
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
## 2022-01-09 ENCOUNTER — Encounter: Payer: Self-pay | Admitting: Nurse Practitioner

## 2022-01-09 ENCOUNTER — Ambulatory Visit (INDEPENDENT_AMBULATORY_CARE_PROVIDER_SITE_OTHER): Payer: Medicare Other | Admitting: Nurse Practitioner

## 2022-01-09 VITALS — BP 112/76 | HR 78 | Temp 97.2°F | Resp 12

## 2022-01-09 DIAGNOSIS — R21 Rash and other nonspecific skin eruption: Secondary | ICD-10-CM | POA: Diagnosis not present

## 2022-01-09 MED ORDER — TRIAMCINOLONE ACETONIDE 0.1 % EX CREA: 1.0000 | TOPICAL_CREAM | Freq: Two times a day (BID) | CUTANEOUS | 0 refills | Status: DC

## 2022-01-09 NOTE — Patient Instructions (Signed)
Nice to see you today Use the cream twice a day for no more than a week Update me on mychart if it does not improve Follow up if no improvement

## 2022-01-09 NOTE — Assessment & Plan Note (Signed)
Patient was given oral prednisone that was beneficial.  We will try topical steroids.  Did discuss precautions in regards to thinning of skin and hypopigmentation.  Patient will use for 1 week to bilateral elbows twice daily.  If no improvement follow back up in office or let us know no signs of infection today.
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
## 2022-01-22 ENCOUNTER — Encounter: Payer: Self-pay | Admitting: Physical Therapy

## 2022-01-22 ENCOUNTER — Ambulatory Visit: Payer: BC Managed Care – PPO | Attending: Internal Medicine | Admitting: Physical Therapy

## 2022-01-22 DIAGNOSIS — M6281 Muscle weakness (generalized): Secondary | ICD-10-CM | POA: Insufficient documentation

## 2022-01-22 DIAGNOSIS — M25511 Pain in right shoulder: Secondary | ICD-10-CM | POA: Insufficient documentation

## 2022-01-22 DIAGNOSIS — R29898 Other symptoms and signs involving the musculoskeletal system: Secondary | ICD-10-CM | POA: Insufficient documentation
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
## 2022-01-29 ENCOUNTER — Ambulatory Visit: Payer: BC Managed Care – PPO | Admitting: Physical Therapy

## 2022-02-22 ENCOUNTER — Encounter: Payer: Self-pay | Admitting: Primary Care

## 2022-02-22 ENCOUNTER — Ambulatory Visit (INDEPENDENT_AMBULATORY_CARE_PROVIDER_SITE_OTHER): Payer: Medicare Other | Admitting: Primary Care

## 2022-02-22 VITALS — BP 136/74 | HR 67 | Temp 98.1°F

## 2022-02-22 DIAGNOSIS — N390 Urinary tract infection, site not specified: Secondary | ICD-10-CM

## 2022-02-22 DIAGNOSIS — N319 Neuromuscular dysfunction of bladder, unspecified: Secondary | ICD-10-CM

## 2022-02-22 DIAGNOSIS — R8290 Unspecified abnormal findings in urine: Secondary | ICD-10-CM | POA: Insufficient documentation

## 2022-02-22 DIAGNOSIS — R21 Rash and other nonspecific skin eruption: Secondary | ICD-10-CM | POA: Diagnosis not present

## 2022-02-22 LAB — POC URINALSYSI DIPSTICK (AUTOMATED)
Bilirubin, UA: NEGATIVE
Blood, UA: POSITIVE
Glucose, UA: NEGATIVE
Ketones, UA: NEGATIVE
Nitrite, UA: NEGATIVE
Protein, UA: POSITIVE — AB
Spec Grav, UA: >=1.03 — AB (ref 1.010–1.025)
Urobilinogen, UA: 1 E.U./dL
pH, UA: 6 (ref 5.0–8.0)

## 2022-02-22 MED ORDER — SULFAMETHOXAZOLE-TRIMETHOPRIM 800-160 MG PO TABS: 1.0000 | ORAL_TABLET | Freq: Two times a day (BID) | ORAL | 0 refills | Status: DC

## 2022-02-22 MED ORDER — TRIAMCINOLONE ACETONIDE 0.1 % EX CREA
1.0000 | TOPICAL_CREAM | Freq: Two times a day (BID) | CUTANEOUS | 0 refills | Status: DC
Start: 2022-02-22 — End: 2022-12-31

## 2022-02-22 NOTE — Assessment & Plan Note (Signed)
Referral placed to [facility] for second opinion.

## 2022-02-22 NOTE — Assessment & Plan Note (Signed)
Referral placed to Urology in [place] for second opinion.

## 2022-02-22 NOTE — Patient Instructions (Signed)
Start Bactrim DS (sulfamethoxazole/trimethoprim) tablets for urinary tract infection. Take 1 tablet by mouth twice daily for 5 days.  You will be contacted regarding your referral to Urology.  Please let us know if you have not been contacted within two weeks.   Use the triamcinolone cream sparingly as needed.  It was a pleasure to see you today!

## 2022-02-22 NOTE — Assessment & Plan Note (Signed)
UA today with trace leuks, trace protein, trace blood. Culture sent.   Treat with Bactrim DS BID x 5 days. Referral placed to Urology.

## 2022-02-22 NOTE — Assessment & Plan Note (Signed)
Somewhat improved.  Discussed the need to avoid leaning against left elbow. Wrapping elbow for protection when working out.  Refill provided for triamcinolone cream. Consider dermatology referral.  No infection noted.
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
## 2022-02-25 LAB — URINE CULTURE
MICRO NUMBER:: 14048440
SPECIMEN QUALITY:: ADEQUATE

## 2022-03-04 ENCOUNTER — Telehealth: Payer: Self-pay

## 2022-03-04 ENCOUNTER — Ambulatory Visit: Payer: BC Managed Care – PPO

## 2022-03-04 NOTE — Telephone Encounter (Signed)
Unsuccessful attempt to reach patient on preferred number listed in notes for scheduled AWV. Left message on voicemail okay to reschedule. 

## 2022-03-08 ENCOUNTER — Encounter: Payer: Self-pay | Admitting: Primary Care

## 2022-03-20 ENCOUNTER — Encounter
Payer: BC Managed Care – PPO | Attending: Physical Medicine & Rehabilitation | Admitting: Physical Medicine & Rehabilitation

## 2022-03-25 ENCOUNTER — Other Ambulatory Visit: Payer: Self-pay | Admitting: Primary Care

## 2022-03-25 DIAGNOSIS — K219 Gastro-esophageal reflux disease without esophagitis: Secondary | ICD-10-CM

## 2022-05-17 ENCOUNTER — Ambulatory Visit: Payer: BC Managed Care – PPO | Admitting: Podiatry

## 2022-05-20 ENCOUNTER — Ambulatory Visit (INDEPENDENT_AMBULATORY_CARE_PROVIDER_SITE_OTHER): Payer: Medicare Other | Admitting: Podiatry

## 2022-05-20 ENCOUNTER — Encounter: Payer: Self-pay | Admitting: Podiatry

## 2022-05-20 DIAGNOSIS — L600 Ingrowing nail: Secondary | ICD-10-CM
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
## 2022-05-28 ENCOUNTER — Telehealth: Payer: Self-pay | Admitting: Primary Care

## 2022-05-28 NOTE — Telephone Encounter (Signed)
LVM for pt to rtn my call to schedule AWV with [facility] call back # [phone] 

## 2022-08-30 ENCOUNTER — Ambulatory Visit: Payer: Medicare Other | Admitting: Primary Care

## 2022-09-04 ENCOUNTER — Ambulatory Visit: Payer: Medicare Other | Admitting: Primary Care

## 2022-09-05 ENCOUNTER — Encounter: Payer: Self-pay | Admitting: Primary Care

## 2022-09-10 ENCOUNTER — Telehealth: Payer: Self-pay

## 2022-09-10 DIAGNOSIS — K219 Gastro-esophageal reflux disease without esophagitis: Secondary | ICD-10-CM

## 2022-09-10 MED ORDER — OMEPRAZOLE 40 MG PO CPDR: 40.0000 mg | DELAYED_RELEASE_CAPSULE | Freq: Every day | ORAL | 0 refills | Status: DC | PRN

## 2022-09-10 NOTE — Telephone Encounter (Signed)
Refills sent to pharmacy. 

## 2022-11-04 ENCOUNTER — Ambulatory Visit: Payer: Medicare Other | Admitting: Primary Care

## 2022-11-05 ENCOUNTER — Encounter: Payer: Self-pay | Admitting: Primary Care

## 2022-12-31 ENCOUNTER — Ambulatory Visit (INDEPENDENT_AMBULATORY_CARE_PROVIDER_SITE_OTHER): Payer: BC Managed Care – PPO | Admitting: Internal Medicine

## 2022-12-31 ENCOUNTER — Encounter: Payer: Self-pay | Admitting: Internal Medicine

## 2022-12-31 VITALS — BP 98/60 | HR 68 | Temp 98.0°F

## 2022-12-31 DIAGNOSIS — R8290 Unspecified abnormal findings in urine: Secondary | ICD-10-CM | POA: Diagnosis not present

## 2022-12-31 DIAGNOSIS — R300 Dysuria: Secondary | ICD-10-CM | POA: Insufficient documentation

## 2022-12-31 LAB — POC URINALSYSI DIPSTICK (AUTOMATED)
Bilirubin, UA: NEGATIVE
Glucose, UA: NEGATIVE
Ketones, UA: NEGATIVE
Leukocytes, UA: NEGATIVE
Nitrite, UA: NEGATIVE
Protein, UA: NEGATIVE
Spec Grav, UA: 1.025 (ref 1.010–1.025)
Urobilinogen, UA: 0.2 E.U./dL
pH, UA: 5.5 (ref 5.0–8.0)

## 2022-12-31 MED ORDER — SULFAMETHOXAZOLE-TRIMETHOPRIM 800-160 MG PO TABS: 1.0000 | ORAL_TABLET | Freq: Two times a day (BID) | ORAL | 1 refills | Status: DC

## 2022-12-31 NOTE — Addendum Note (Signed)
Addended by: Eual Fines on: 12/31/2022 02:07 PM   Modules accepted: Orders
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
## 2022-12-31 NOTE — Assessment & Plan Note (Signed)
Urinalysis is benign--negative leuks/nitrite High risk though Will send culture Start septra DS again---bid for 7 days. Can stop if culture negative

## 2023-01-02 LAB — URINE CULTURE
MICRO NUMBER:: 15355770
SPECIMEN QUALITY:: ADEQUATE

## 2023-01-08 ENCOUNTER — Telehealth: Payer: Self-pay | Admitting: *Deleted

## 2023-01-08 NOTE — Telephone Encounter (Signed)
Patient's mother requesting an order be sent to [facility] for wheelchair repair.

## 2023-01-09 NOTE — Telephone Encounter (Signed)
Spoke with patient and made aware an appt will be needed to accompany an order for [facility] repairs. He will have his mother call back to schedule.

## 2023-01-15 ENCOUNTER — Encounter: Payer: Self-pay | Admitting: Physical Medicine & Rehabilitation

## 2023-01-15 ENCOUNTER — Encounter
Payer: BC Managed Care – PPO | Attending: Physical Medicine & Rehabilitation | Admitting: Physical Medicine & Rehabilitation

## 2023-01-15 VITALS — BP 109/65 | HR 69 | Ht 73.0 in | Wt 315.0 lb

## 2023-01-15 DIAGNOSIS — K592 Neurogenic bowel, not elsewhere classified: Secondary | ICD-10-CM | POA: Insufficient documentation

## 2023-01-15 DIAGNOSIS — S14109S Unspecified injury at unspecified level of cervical spinal cord, sequela: Secondary | ICD-10-CM | POA: Diagnosis not present

## 2023-01-15 DIAGNOSIS — N319 Neuromuscular dysfunction of bladder, unspecified: Secondary | ICD-10-CM | POA: Insufficient documentation

## 2023-01-15 DIAGNOSIS — K219 Gastro-esophageal reflux disease without esophagitis: Secondary | ICD-10-CM | POA: Insufficient documentation

## 2023-01-15 DIAGNOSIS — M792 Neuralgia and neuritis, unspecified: Secondary | ICD-10-CM | POA: Diagnosis present

## 2023-01-15 MED ORDER — OMEPRAZOLE 40 MG PO CPDR: 40.0000 mg | DELAYED_RELEASE_CAPSULE | Freq: Every day | ORAL | 0 refills | Status: DC | PRN

## 2023-01-15 MED ORDER — GABAPENTIN 300 MG PO CAPS: 300.0000 mg | ORAL_CAPSULE | Freq: Every day | ORAL | 3 refills | Status: AC | PRN

## 2023-01-15 MED ORDER — BACLOFEN 10 MG PO TABS: 5.0000 mg | ORAL_TABLET | Freq: Three times a day (TID) | ORAL | 5 refills | Status: AC | PRN

## 2023-01-15 NOTE — Patient Instructions (Signed)
ALWAYS FEEL FREE TO CALL OUR OFFICE WITH ANY PROBLEMS OR QUESTIONS ([phone])  **PLEASE NOTE** ALL MEDICATION REFILL REQUESTS (INCLUDING CONTROLLED SUBSTANCES) NEED TO BE MADE AT LEAST 7 DAYS PRIOR TO REFILL BEING DUE. ANY REFILL REQUESTS INSIDE THAT TIME FRAME MAY RESULT IN DELAYS IN RECEIVING YOUR PRESCRIPTION.                    
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
## 2023-02-27 ENCOUNTER — Ambulatory Visit: Payer: BC Managed Care – PPO | Admitting: Podiatry

## 2023-03-03 ENCOUNTER — Ambulatory Visit: Payer: Medicare Other | Admitting: Podiatry

## 2023-03-05 ENCOUNTER — Telehealth: Payer: Self-pay | Admitting: Physical Medicine & Rehabilitation

## 2023-03-05 NOTE — Telephone Encounter (Signed)
Mom called in states she has not heard back from [employer] for wheelchair repair and had called in Sept and was told they were working on it but she attempted to contact them again and now they are saying they did not receive an order .   Patient also continues with sweating and mom requested a sooner follow up appointment with Dr Riley Kill, scheduled next available and added on wait list

## 2023-03-06 NOTE — Telephone Encounter (Signed)
  Media Information  Document Information  AMB Bari Mantis  RX [facility] PHY MED AND REHAB  01/15/2023 12:18  Attached To:  Keith [name]  Source Information  Default, Provider, MD  Document History

## 2023-04-09 ENCOUNTER — Encounter: Payer: Medicare Other | Admitting: Physical Medicine & Rehabilitation

## 2023-05-21 ENCOUNTER — Telehealth: Payer: Self-pay | Admitting: *Deleted

## 2023-05-21 ENCOUNTER — Telehealth: Payer: Self-pay | Admitting: Primary Care

## 2023-05-21 NOTE — Telephone Encounter (Signed)
 Printed and placed in [employer].  Patient also needs follow up visit scheduled.

## 2023-05-21 NOTE — Telephone Encounter (Signed)
 Copied from CRM [id number]. Topic: General - Other >> May 21, 2023  2:08 PM Powell B wrote: Reason for CRM: Patient mother calling requesting to pick up letter from PCP stating that patient requires 24/7 care and supervision at home to give [facility] for assistance. [phone]

## 2023-05-21 NOTE — Telephone Encounter (Signed)
 Mrs Tsang called and is requesting a letter from Dr Babs stating that Tobenna requires 24 hour care. She is trying to get some assistance from medicaid and social security disability to get an aid to help with him. She is willing to come pick the letter up.

## 2023-05-22 NOTE — Telephone Encounter (Signed)
 Called patient made follow up requested letter be mailed to home address on file. Put in mail today

## 2023-05-22 NOTE — Telephone Encounter (Signed)
 A letter was written and is in Epic. Should be available in My Chart too.   I will not do anything like this again unless he begins to consistently come to appointments. He has canceled or no-showed the last 3 visits and has missed 5 of the last 7. We're not asking him to come every month.......SABRA

## 2023-05-23 NOTE — Telephone Encounter (Signed)
 Notified of letter, she would like to pick it up. I also informed her Erving must keep his appointments for further assistance.

## 2023-06-04 ENCOUNTER — Encounter: Payer: Self-pay | Admitting: Primary Care

## 2023-06-04 ENCOUNTER — Ambulatory Visit (INDEPENDENT_AMBULATORY_CARE_PROVIDER_SITE_OTHER): Payer: Medicare Other | Admitting: Primary Care

## 2023-06-04 VITALS — BP 122/78 | HR 65 | Temp 97.9°F

## 2023-06-04 DIAGNOSIS — G4733 Obstructive sleep apnea (adult) (pediatric): Secondary | ICD-10-CM | POA: Diagnosis not present

## 2023-06-04 DIAGNOSIS — G8250 Quadriplegia, unspecified: Secondary | ICD-10-CM

## 2023-06-04 DIAGNOSIS — K219 Gastro-esophageal reflux disease without esophagitis: Secondary | ICD-10-CM

## 2023-06-04 DIAGNOSIS — R7303 Prediabetes: Secondary | ICD-10-CM

## 2023-06-04 DIAGNOSIS — R21 Rash and other nonspecific skin eruption: Secondary | ICD-10-CM | POA: Diagnosis not present

## 2023-06-04 DIAGNOSIS — G5603 Carpal tunnel syndrome, bilateral upper limbs: Secondary | ICD-10-CM

## 2023-06-04 DIAGNOSIS — G5600 Carpal tunnel syndrome, unspecified upper limb: Secondary | ICD-10-CM | POA: Insufficient documentation

## 2023-06-04 DIAGNOSIS — M792 Neuralgia and neuritis, unspecified: Secondary | ICD-10-CM

## 2023-06-04 LAB — CBC
HCT: 39.3 % (ref 39.0–52.0)
Hemoglobin: 12.9 g/dL — ABNORMAL LOW (ref 13.0–17.0)
MCHC: 32.8 g/dL (ref 30.0–36.0)
MCV: 87.7 fL (ref 78.0–100.0)
Platelets: 281 10*3/uL (ref 150.0–400.0)
RBC: 4.48 Mil/uL (ref 4.22–5.81)
RDW: 13.4 % (ref 11.5–15.5)
WBC: 6.5 10*3/uL (ref 4.0–10.5)

## 2023-06-04 LAB — COMPREHENSIVE METABOLIC PANEL
ALT: 18 U/L (ref 0–53)
AST: 15 U/L (ref 0–37)
Albumin: 4.1 g/dL (ref 3.5–5.2)
Alkaline Phosphatase: 70 U/L (ref 39–117)
BUN: 13 mg/dL (ref 6–23)
CO2: 23 meq/L (ref 19–32)
Calcium: 9 mg/dL (ref 8.4–10.5)
Chloride: 108 meq/L (ref 96–112)
Creatinine, Ser: 0.72 mg/dL (ref 0.40–1.50)
GFR: 126.2 mL/min (ref >60.00)
Glucose, Bld: 80 mg/dL (ref 70–99)
Potassium: 4.6 meq/L (ref 3.5–5.1)
Sodium: 138 meq/L (ref 135–145)
Total Bilirubin: 0.6 mg/dL (ref 0.2–1.2)
Total Protein: 7.4 g/dL (ref 6.0–8.3)

## 2023-06-04 LAB — LIPID PANEL
Cholesterol: 133 mg/dL (ref 0–200)
HDL: 45.1 mg/dL (ref >39.00)
LDL Cholesterol: 73 mg/dL (ref 0–99)
NonHDL: 88.32
Total CHOL/HDL Ratio: 3
Triglycerides: 77 mg/dL (ref 0.0–149.0)
VLDL: 15.4 mg/dL (ref 0.0–40.0)

## 2023-06-04 LAB — HEMOGLOBIN A1C: Hgb A1c MFr Bld: 5.5 % (ref 4.6–6.5)

## 2023-06-04 MED ORDER — TRIAMCINOLONE ACETONIDE 0.5 % EX OINT: 1.0000 | TOPICAL_OINTMENT | Freq: Two times a day (BID) | CUTANEOUS | 0 refills | Status: DC

## 2023-06-04 NOTE — Assessment & Plan Note (Signed)
No evaluation or treatment for years.  Referral placed for [facility] for sleep study.

## 2023-06-04 NOTE — Assessment & Plan Note (Addendum)
Following with physical medicine, office notes reviewed from September 2024.  Continue baclofen 5 mg 3 times daily as needed, gabapentin 300 mg as needed.  Agree that he needs assistance at home with ADLs.  Letter provided previously.

## 2023-06-04 NOTE — Assessment & Plan Note (Signed)
Checking lipids and A1c today.

## 2023-06-04 NOTE — Assessment & Plan Note (Addendum)
Stable.  Continue gabapentin 300mg  as needed.

## 2023-06-04 NOTE — Assessment & Plan Note (Signed)
HPI and exam today representative.  He will set up a visit with our sports medicine physician for consideration of injections.
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
## 2023-06-04 NOTE — Assessment & Plan Note (Signed)
Controlled.  Continue to avoid trigger foods. Continue omeprazole 40 mg as needed.

## 2023-06-04 NOTE — Assessment & Plan Note (Signed)
Exam today representative of psoriasis.  Start triamcinolone 0.5 percent ointment twice daily then as needed. Referral placed to dermatology

## 2023-06-04 NOTE — Patient Instructions (Signed)
Stop by the lab prior to leaving today. I will notify you of your results once received.   You may apply the triamcinolone ointment to your elbows twice daily for 1 week, then as needed.  You will either be contacted via phone regarding your referral to dermatology, or you may receive a letter on your MyChart portal from our referral team with instructions for scheduling an appointment. Please let us know if you have not been contacted by anyone within two weeks.  Schedule an appointment with Dr. Patsy Lager for your carpal tunnel symptoms.  Complete the sleep study once received.  I will be in touch with results.  It was a pleasure to see you today!

## 2023-06-15 NOTE — Progress Notes (Unsigned)
   Keith [name] T. Brielle Moro, MD, CAQ Sports Medicine [facility] at [facility] [address] [place], [address]  Phone: [phone]  FAX: [phone]  Keith [name] - 28 y.o. male  MRN [id number]  Date of Birth: 1996-03-15  Date: 06/16/2023  PCP: Doreene Nest, NP  Referral: Doreene Nest, NP  No chief complaint on file.  Subjective:   Keith [name] is a 28 y.o. very pleasant male patient with There is no height or weight on file to calculate BMI. who presents with the following:  Patient presents with some ongoing bilateral thumb pain.  Did previously see physical medicine and rehab, and they recommended some splinting which has not really helped all that much significantly.  He does do a lot of repetitive motion with the thumbs and likes to play on his phone.    Review of Systems is noted in the HPI, as appropriate  Objective:   There were no vitals taken for this visit.  GEN: No acute distress; alert,appropriate. PULM: Breathing comfortably in no respiratory distress PSYCH: Normally interactive.   Laboratory and Imaging Data:  Assessment and Plan:   ***

## 2023-06-16 ENCOUNTER — Encounter: Payer: Self-pay | Admitting: Family Medicine

## 2023-06-16 ENCOUNTER — Ambulatory Visit (INDEPENDENT_AMBULATORY_CARE_PROVIDER_SITE_OTHER): Payer: Medicare Other | Admitting: Family Medicine

## 2023-06-16 VITALS — BP 112/80 | HR 69 | Temp 98.0°F | Ht 73.0 in

## 2023-06-16 DIAGNOSIS — M778 Other enthesopathies, not elsewhere classified: Secondary | ICD-10-CM

## 2023-06-16 DIAGNOSIS — K219 Gastro-esophageal reflux disease without esophagitis: Secondary | ICD-10-CM | POA: Diagnosis not present

## 2023-06-16 DIAGNOSIS — M65949 Unspecified synovitis and tenosynovitis, unspecified hand: Secondary | ICD-10-CM

## 2023-06-16 DIAGNOSIS — M79645 Pain in left finger(s): Secondary | ICD-10-CM

## 2023-06-16 MED ORDER — TRIAMCINOLONE ACETONIDE 40 MG/ML IJ SUSP
20.0000 mg | Freq: Once | INTRAMUSCULAR | Status: AC
  Administered 2023-06-16: 20 mg via INTRA_ARTICULAR

## 2023-06-16 MED ORDER — OMEPRAZOLE 40 MG PO CPDR: 40.0000 mg | DELAYED_RELEASE_CAPSULE | Freq: Every day | ORAL | 3 refills | Status: AC | PRN

## 2023-06-16 NOTE — Patient Instructions (Signed)
Voltaren 1% gel, over the counter ?You can apply up to 4 times a day ? ?This can be applied to any joint: knee, wrist, fingers, elbows, shoulders, feet and ankles. ?Can apply to any tendon: tennis elbow, achilles, tendon, rotator cuff or any other tendon. ? ?Minimal is absorbed in the bloodstream: ok with oral anti-inflammatory or a blood thinner. ? ?Cost is about 9 dollars  ?

## 2023-06-18 ENCOUNTER — Encounter: Payer: Self-pay | Admitting: Physical Medicine & Rehabilitation

## 2023-06-18 ENCOUNTER — Encounter: Payer: Medicare Other | Attending: Physical Medicine & Rehabilitation | Admitting: Physical Medicine & Rehabilitation

## 2023-06-18 VITALS — BP 114/77 | HR 68 | Ht 73.0 in

## 2023-06-18 DIAGNOSIS — K592 Neurogenic bowel, not elsewhere classified: Secondary | ICD-10-CM

## 2023-06-18 DIAGNOSIS — G5603 Carpal tunnel syndrome, bilateral upper limbs: Secondary | ICD-10-CM

## 2023-06-18 DIAGNOSIS — S14109S Unspecified injury at unspecified level of cervical spinal cord, sequela: Secondary | ICD-10-CM | POA: Diagnosis not present

## 2023-06-18 NOTE — Patient Instructions (Signed)
 ALWAYS FEEL FREE TO CALL OUR OFFICE WITH ANY PROBLEMS OR QUESTIONS [phone])  **PLEASE NOTE** ALL MEDICATION REFILL REQUESTS (INCLUDING CONTROLLED SUBSTANCES) NEED TO BE MADE AT LEAST 7 DAYS PRIOR TO REFILL BEING DUE. ANY REFILL REQUESTS INSIDE THAT TIME FRAME MAY RESULT IN DELAYS IN RECEIVING YOUR PRESCRIPTION.
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
## 2023-06-23 ENCOUNTER — Encounter: Payer: Self-pay | Admitting: *Deleted

## 2023-08-13 ENCOUNTER — Encounter: Payer: Self-pay | Admitting: Internal Medicine

## 2023-08-13 ENCOUNTER — Ambulatory Visit: Payer: Self-pay

## 2023-08-13 ENCOUNTER — Ambulatory Visit (INDEPENDENT_AMBULATORY_CARE_PROVIDER_SITE_OTHER): Admitting: Internal Medicine

## 2023-08-13 VITALS — BP 128/88 | HR 78 | Temp 98.3°F

## 2023-08-13 DIAGNOSIS — R1030 Lower abdominal pain, unspecified: Secondary | ICD-10-CM

## 2023-08-13 DIAGNOSIS — R350 Frequency of micturition: Secondary | ICD-10-CM | POA: Diagnosis not present

## 2023-08-13 LAB — POC URINALSYSI DIPSTICK (AUTOMATED)
Bilirubin, UA: NEGATIVE
Glucose, UA: NEGATIVE
Ketones, UA: NEGATIVE
Nitrite, UA: NEGATIVE
Protein, UA: NEGATIVE
Spec Grav, UA: 1.02 (ref 1.010–1.025)
Urobilinogen, UA: 0.2 U/dL
pH, UA: 6 (ref 5.0–8.0)

## 2023-08-13 MED ORDER — CEPHALEXIN 500 MG PO CAPS: 500.0000 mg | ORAL_CAPSULE | Freq: Three times a day (TID) | ORAL | 0 refills | Status: DC

## 2023-08-13 NOTE — Addendum Note (Signed)
 Addended by: Eual Fines on: 08/13/2023 01:08 PM   Modules accepted: Orders

## 2023-08-13 NOTE — Telephone Encounter (Signed)
  Chief Complaint: urine frequency  Symptoms: cloudy, foul smell  Disposition: [] ED /[] Urgent Care (no appt availability in office) / [x] Appointment(In office/virtual)/ []  [place] [facility]/ [] Home Care/ [] Refused Recommended Disposition /[] [place] [facility]/ []  Follow-up with PCP Additional Notes: Pt mom Tona Sensing called with concerns of kidney infection/UTI. 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 Mom states this is an on going problem. Pt has foul smelling, cloudy urine and has sediments. Denies blood.  Pt's frequency is every 45 to 60 mins. Mom says he can't feel much, but they keep heating pads on lower back to help with discomfort. Pt has appt today at 1230 with [place]. RN gave care advice and mom verbalized understanding.           Copied from CRM [id number]. Topic: Clinical - Red Word Triage >> Aug 13, 2023 10:41 AM Gurney Maxin H wrote: [facility] that prompted transfer to Nurse Triage: Patients mom states [name] urine is yellow, cloudy, foamy with a foul odor. Patient is in a wheelchair Reason for Disposition  Side (flank) or lower back pain present  Answer Assessment - Initial Assessment Questions 1. SYMPTOM: "What's the main symptom you're concerned about?" (e.g., frequency, incontinence)     Yellow, cloudy, foul smelling urine  2. ONSET: "When did the    start?"     Off and on  3. PAIN: "Is there any pain?" If Yes, ask: "How bad is it?" (Scale: 1-10; mild, moderate, severe)     Denies  4. CAUSE: "What do you think is causing the symptoms?"     Not sure  5. OTHER SYMPTOMS: "Do you have any other symptoms?" (e.g., blood in urine, fever, flank pain, pain with urination)     Denies  Protocols used: Urinary Symptoms-A-AH
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
## 2023-08-13 NOTE — Assessment & Plan Note (Addendum)
 With some urinary symptoms and low back pain Urinalysis 2+ leuks and blood--but negative nitrite Mom is worried about kidney function---creatinine normal in January and no nephrotoxic meds (reassuring that imaging isn't needed at this point) Will try empiric Rx with cephalexin 500 tid x 7 days (but can stop sooner if culture negative and symptoms resolve)

## 2023-08-13 NOTE — Telephone Encounter (Signed)
Will assess at the [place]

## 2023-08-15 ENCOUNTER — Encounter: Payer: Self-pay | Admitting: Internal Medicine

## 2023-08-15 LAB — URINE CULTURE
MICRO NUMBER:: 16279660
SPECIMEN QUALITY:: ADEQUATE

## 2023-08-21 ENCOUNTER — Telehealth: Payer: Self-pay

## 2023-08-21 NOTE — Telephone Encounter (Signed)
 Called to see how pt was doing after recent OV. We received notification that the urine culture results were not viewed.

## 2023-09-12 ENCOUNTER — Ambulatory Visit: Admitting: Primary Care

## 2023-10-27 ENCOUNTER — Ambulatory Visit (INDEPENDENT_AMBULATORY_CARE_PROVIDER_SITE_OTHER)

## 2023-10-27 VITALS — Ht 74.0 in | Wt 315.0 lb

## 2023-10-27 DIAGNOSIS — Z0000 Encounter for general adult medical examination without abnormal findings: Secondary | ICD-10-CM | POA: Diagnosis not present
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
## 2023-10-27 NOTE — Patient Instructions (Signed)
 Mr. Rogan , Thank you for taking time out of your busy schedule to complete your Annual Wellness Visit with me. I enjoyed our conversation and look forward to speaking with you again next year. I, as well as your care team,  appreciate your ongoing commitment to your health goals. Please review the following plan we discussed and let me know if I can assist you in the future. Your Game plan/ To Do List     Follow up Visits: Next Medicare AWV with our clinical staff: 10/29/23 @3pm  televisit   Have you seen your provider in the last 6 months (3 months if uncontrolled diabetes)? Yes Next Office Visit with your provider: 12/03/23  Clinician Recommendations:  Aim for 30 minutes of upper body exercise , 6-8 glasses of water, and 5 servings of fruits and vegetables each day.       This is a list of the screening recommended for you and due dates:  Health Maintenance  Topic Date Due   HPV Vaccine (3 - Male 3-dose series) 06/03/2024*   Flu Shot  12/12/2023   Medicare Annual Wellness Visit  10/26/2024   DTaP/Tdap/Td vaccine (8 - Tdap) 05/24/2027   Pneumococcal Vaccination  Completed   Hepatitis C Screening  Completed   HIV Screening  Completed   Meningitis B Vaccine  Aged Out   COVID-19 Vaccine  Discontinued  *Topic was postponed. The date shown is not the original due date.    Advanced directives: (In Chart) A copy of your advanced directives are scanned into your chart should your provider ever need it. Advance Care Planning is important because it:  [x]  Makes sure you receive the medical care that is consistent with your values, goals, and preferences  [x]  It provides guidance to your family and loved ones and reduces their decisional burden about whether or not they are making the right decisions based on your wishes.  Follow the link provided in your after visit summary or read over the paperwork we have mailed to you to help you started getting your Advance Directives in place. If you  need assistance in completing these, please reach out to us  so that we can help you!

## 2023-11-03 ENCOUNTER — Telehealth: Payer: Self-pay | Admitting: Physical Medicine & Rehabilitation

## 2023-11-03 NOTE — Telephone Encounter (Signed)
 Ps mom called and wants to know if you can get in touch w airflow to increase his catheter bc its not lasting and he is taking in a lot of fluids

## 2023-11-27 ENCOUNTER — Ambulatory Visit: Admitting: Primary Care

## 2023-12-03 ENCOUNTER — Encounter: Admitting: Primary Care

## 2023-12-08 ENCOUNTER — Telehealth: Payer: Self-pay | Admitting: Physical Medicine & Rehabilitation

## 2023-12-08 NOTE — Telephone Encounter (Signed)
 Patients mother called requesting and increase of caterers stating that the pt is urinating more frequently do to it being summer time.

## 2024-01-14 ENCOUNTER — Ambulatory Visit: Payer: Medicare Other | Admitting: Physical Medicine & Rehabilitation

## 2024-03-11 ENCOUNTER — Ambulatory Visit: Payer: Self-pay

## 2024-03-11 NOTE — Telephone Encounter (Signed)
 FYI Only or Action Required?: FYI only for provider: appointment scheduled on 10/31.  Patient was last seen in primary care on 08/13/2023 by Jimmy Charlie FERNS, MD.  Called Nurse Triage reporting Shoulder Pain.  Symptoms began several weeks ago.  Interventions attempted: Rest, hydration, or home remedies.  Symptoms are: unchanged.  Triage Disposition: See PCP When Office is Open (Within 3 Days)  Patient/caregiver understands and will follow disposition?: Yes, will follow disposition  Copied from CRM [id number]. Topic: Clinical - Red Word Triage >> Mar 11, 2024  1:29 PM Nessti S wrote: [facility] that prompted transfer to Nurse Triage: pain in shoulder and can't sleep at night   ----------------------------------------------------------------------- From previous Reason for Contact - Scheduling: Patient/patient representative is calling to schedule an appointment. Refer to attachments for appointment information. Reason for Disposition  [1] MODERATE pain (e.g., interferes with normal activities) AND [2] present > 3 days  Answer Assessment - Initial Assessment Questions 1. ONSET: When did the pain start?     Couple weeks ago 2. LOCATION: Where is the pain located?     R 3. PAIN: How bad is the pain? (Scale 1-10; or mild, moderate, severe)     moderate 4. WORK OR EXERCISE: Has there been any recent work or exercise that involved this part of the body?     Denies injury 5. CAUSE: What do you think is causing the shoulder pain?     Unsure pulled muscle or something 6. OTHER SYMPTOMS: Do you have any other symptoms? (e.g., neck pain, swelling, rash, fever, numbness, weakness)     Worse with movement, Denies SOB/difficulty breathing,  Protocols used: Shoulder Pain-A-AH

## 2024-03-12 ENCOUNTER — Ambulatory Visit (INDEPENDENT_AMBULATORY_CARE_PROVIDER_SITE_OTHER)
Admission: RE | Admit: 2024-03-12 | Discharge: 2024-03-12 | Disposition: A | Discharge status: Home / Self Care | Source: Ambulatory Visit

## 2024-03-12 ENCOUNTER — Ambulatory Visit

## 2024-03-12 VITALS — BP 112/70 | HR 75 | Temp 98.8°F | Ht 73.0 in

## 2024-03-12 DIAGNOSIS — M25511 Pain in right shoulder: Secondary | ICD-10-CM | POA: Diagnosis not present

## 2024-03-12 MED ORDER — IBUPROFEN 800 MG PO TABS: 800.0000 mg | ORAL_TABLET | Freq: Three times a day (TID) | ORAL | 0 refills | Status: AC | PRN

## 2024-03-12 NOTE — Patient Instructions (Addendum)
 Thank you for visiting [facility] today! Here's what we talked about: - Use Motrin  as needed for pain - Call physical therapy in 2 weeks

## 2024-03-12 NOTE — Progress Notes (Signed)
 Subjective:   This visit was conducted in person. The patient gave informed consent to the use of Abridge AI technology to record the contents of the encounter as documented below.   Patient ID: Keith [name], male    DOB: 1995-08-26, 27 y.o.   MRN: [id number]   Discussed the use of AI scribe software for clinical note transcription with the patient, who gave verbal consent to proceed.  History of Present Illness Keith [name] is a 28 year old male who presents with right shoulder pain and popping.  He has been experiencing popping and clicking in his right shoulder for the past two weeks, accompanied by sharp, tingling pain localized to the anterior shoulder. The pain is particularly noticeable during certain movements, such as reaching for objects or moving the arm backward, and he rates it as a 7 out of 10 in severity during these movements.  There is no history of prior injury to the shoulder, and the pain is intermittent rather than constant. It does not radiate and is somewhat alleviated by moving slowly or stretching. He has used a Salonpas pain relief patch, which provided minimal relief. He does not experience weakness when holding objects but notes a sensation of weakness before grasping them. He is able to sleep on his right side without discomfort. No numbness or tingling in the right arm, and no history of surgery on the shoulder.  His current medications include gabapentin , omeprazole , and baclofen . He has not been using any long-term pain medications for this issue.      Review of Systems      Allergies  Allergen Reactions   Diflucan  [Fluconazole ] Rash    Made rash worse    Current Outpatient Medications on File Prior to Visit  Medication Sig Dispense Refill   baclofen  (LIORESAL ) 10 MG tablet Take 0.5-1 tablets (5-10 mg total) by mouth 3 (three) times daily as needed for muscle spasms. 30 each 5   gabapentin  (NEURONTIN ) 300 MG capsule Take 1 capsule (300 mg  total) by mouth daily as needed (nerve pain). 30 capsule 3   omeprazole  (PRILOSEC) 40 MG capsule Take 1 capsule (40 mg total) by mouth daily as needed. For heartburn 90 capsule 3   No current facility-administered medications on file prior to visit.    BP 112/70 (BP Location: Right Arm, Patient Position: Sitting, Cuff Size: Normal)   Pulse 75   Temp 98.8 F (37.1 C) (Oral)   Ht 6' 1 (1.854 m)   SpO2 96%   BMI 41.56 kg/m   Objective:      Physical Exam GENERAL: Alert, cooperative, well developed, no acute distress. Obese.  In power wheelchair. HEAD: Normocephalic atraumatic. NEUROLOGICAL: Oriented to person, place and time, no gait abnormalities, moves all extremities without gross motor or sensory deficit. MUSCULOSKELETAL: Shoulder normal appearance, no swelling, no unevenness. Tenderness to palpation on right anterior shoulder. Normal range of motion on internal and external rotation. Negative lift off test. Positive Neers test. Negative drop arm test.  Positive empty can test.       Assessment & Plan:   Assessment & Plan Right shoulder pain with possible biceps and rotator cuff tendinitis Intermittent sharp, tingling pain in the anterior shoulder, exacerbated by movement. Differential includes biceps and rotator cuff tendinitis. No rotator cuff tear. Steroid injection not indicated at this time, but can be considered if failure to tolerate PT or severe worsening of pain.  - Ordered right shoulder x-ray to rule out bony abnormalities. -  Referred to physical therapy for 8-12 weeks. - Prescribed Motrin  800 mg as needed with food. - Scheduled follow-up in 8 weeks to assess progress.   Return in about 8 weeks (around 05/07/2024) for R shoulder pain with PCP.   Marlan Steward K Reason Helzer, MD  03/12/24     Contains text generated by Abridge.

## 2024-03-17 ENCOUNTER — Ambulatory Visit: Payer: Self-pay

## 2024-05-11 ENCOUNTER — Ambulatory Visit: Admitting: Primary Care

## 2024-06-16 ENCOUNTER — Encounter: Payer: Medicare Other | Admitting: Physical Medicine & Rehabilitation
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
## 2024-10-28 ENCOUNTER — Ambulatory Visit
